# Patient Record
Sex: Male | Born: 1960 | Race: White | Hispanic: No | Smoking: Former smoker
Health system: Southern US, Community
[De-identification: ages and names within clinical notes are randomized; demographics above are authoritative.]

## PROBLEM LIST (undated history)

## (undated) ENCOUNTER — Ambulatory Visit: Admission: EM | Payer: Commercial Managed Care - PPO | Source: Home / Self Care

## (undated) DIAGNOSIS — E7800 Pure hypercholesterolemia, unspecified: Secondary | ICD-10-CM

## (undated) DIAGNOSIS — I10 Essential (primary) hypertension: Secondary | ICD-10-CM

## (undated) DIAGNOSIS — I2510 Atherosclerotic heart disease of native coronary artery without angina pectoris: Secondary | ICD-10-CM

## (undated) HISTORY — PX: ANKLE SURGERY: SHX546

## (undated) HISTORY — PX: CORONARY ARTERY BYPASS GRAFT: SHX141

---

## 2012-10-30 ENCOUNTER — Emergency Department (HOSPITAL_COMMUNITY): Payer: Self-pay

## 2012-10-30 ENCOUNTER — Emergency Department (HOSPITAL_COMMUNITY)
Admission: EM | Admit: 2012-10-30 | Discharge: 2012-10-30 | Disposition: A | Discharge status: Home / Self Care | Payer: Self-pay | Attending: Emergency Medicine | Admitting: Emergency Medicine

## 2012-10-30 ENCOUNTER — Encounter (HOSPITAL_COMMUNITY): Payer: Self-pay | Admitting: *Deleted

## 2012-10-30 DIAGNOSIS — S0990XA Unspecified injury of head, initial encounter: Secondary | ICD-10-CM | POA: Insufficient documentation

## 2012-10-30 DIAGNOSIS — Z23 Encounter for immunization: Secondary | ICD-10-CM | POA: Insufficient documentation

## 2012-10-30 DIAGNOSIS — W1809XA Striking against other object with subsequent fall, initial encounter: Secondary | ICD-10-CM | POA: Insufficient documentation

## 2012-10-30 DIAGNOSIS — S0101XA Laceration without foreign body of scalp, initial encounter: Secondary | ICD-10-CM

## 2012-10-30 DIAGNOSIS — S139XXA Sprain of joints and ligaments of unspecified parts of neck, initial encounter: Secondary | ICD-10-CM | POA: Insufficient documentation

## 2012-10-30 DIAGNOSIS — I10 Essential (primary) hypertension: Secondary | ICD-10-CM | POA: Insufficient documentation

## 2012-10-30 DIAGNOSIS — Y9289 Other specified places as the place of occurrence of the external cause: Secondary | ICD-10-CM | POA: Insufficient documentation

## 2012-10-30 DIAGNOSIS — F17200 Nicotine dependence, unspecified, uncomplicated: Secondary | ICD-10-CM | POA: Insufficient documentation

## 2012-10-30 DIAGNOSIS — S161XXA Strain of muscle, fascia and tendon at neck level, initial encounter: Secondary | ICD-10-CM

## 2012-10-30 DIAGNOSIS — W19XXXA Unspecified fall, initial encounter: Secondary | ICD-10-CM

## 2012-10-30 DIAGNOSIS — S0100XA Unspecified open wound of scalp, initial encounter: Secondary | ICD-10-CM | POA: Insufficient documentation

## 2012-10-30 DIAGNOSIS — Y9389 Activity, other specified: Secondary | ICD-10-CM | POA: Insufficient documentation

## 2012-10-30 DIAGNOSIS — W010XXA Fall on same level from slipping, tripping and stumbling without subsequent striking against object, initial encounter: Secondary | ICD-10-CM | POA: Insufficient documentation

## 2012-10-30 HISTORY — DX: Essential (primary) hypertension: I10

## 2012-10-30 MED ORDER — TETANUS-DIPHTH-ACELL PERTUSSIS 5-2.5-18.5 LF-MCG/0.5 IM SUSP
0.5000 mL | Freq: Once | INTRAMUSCULAR | Status: AC
  Administered 2012-10-30: 0.5 mL via INTRAMUSCULAR
  Filled 2012-10-30 (×2): qty 0.5

## 2012-10-30 MED ORDER — LIDOCAINE HCL (PF) 1 % IJ SOLN
INTRAMUSCULAR | Status: AC
  Administered 2012-10-30: 5 mL
  Filled 2012-10-30: qty 5

## 2012-10-30 NOTE — ED Notes (Signed)
c-collar applied, cms intact all extremities.

## 2012-10-30 NOTE — ED Notes (Signed)
Pt brought to er by family after fall and  laceration to head that occurred this am, pt states that he thinks he may have tripped in his shop this am causing him to fall hitting his head on the table in the shop, according to family they found pt right after the fall, denies any LOC, pt is confused and has been confused since the fall. Unable to state his birth date. C/o laceration to head, neck pain. Smells of etoh, admits to drinking 8 beers last night.

## 2012-10-30 NOTE — ED Provider Notes (Signed)
History  This chart was scribed for Dennis Lyons, MD by Shari Heritage, ED Scribe. The patient was seen in room APA04/APA04. Patient's care was started at 1054.   CSN: [id number]  Arrival date & time 10/30/12  1034   First MD Initiated Contact with Patient 10/30/12 1054      Chief Complaint  Patient presents with  . Head Injury     The history is provided by the patient. No language interpreter was used.   HPI Comments: Dennis Winters is a 52 y.o. male who presents to the Emergency Department complaining of laceration to the right scalp onset 3 hours ago. Patient is also complaining of moderate, constant head pain. Patient states that he tripped and fell in his shop at about 8am this morning. He reports that laceration results from him hitting his head on a table upon falling. There are no other injuries or skin tears. He denies nausea, vomiting, neck pain, chest pain, abdominal pain, leg pain, numbness, weakness or any other symptoms. Perd ED staff, patient admits to having consumed at least 8 beers since last night. Tetanus status is unknown. Patient has a history of hypertension.   Past Medical History  Diagnosis Date  . Hypertension     Past Surgical History  Procedure Laterality Date  . Ankle surgery      No family history on file.  History  Substance Use Topics  . Smoking status: Current Every Day Smoker  . Smokeless tobacco: Not on file  . Alcohol Use: Yes      Review of Systems A complete 10 system review of systems was obtained and all systems are negative except as noted in the HPI and PMH.   Allergies  Penicillins  Home Medications  No current outpatient prescriptions on file.  Triage Vitals: BP 151/101  Pulse 72  Temp(Src) 97 F (36.1 C) (Oral)  Resp 20  SpO2 97%  Physical Exam  Constitutional: He is oriented to person, place, and time. He appears well-developed and well-nourished.  HENT:  Head: Normocephalic. Head is with laceration.  There is  a 5 cm L shaped laceration to the right scalp just above the ear. Bleeding is controlled.  Eyes: EOM are normal. Pupils are equal, round, and reactive to light.  Neck: Normal range of motion. Neck supple.  Pulmonary/Chest: No respiratory distress.  Musculoskeletal: Normal range of motion.  Neurological: He is alert and oriented to person, place, and time. No cranial nerve deficit. He exhibits normal muscle tone. Coordination normal.  Skin: Skin is warm and dry.    ED Course  Procedures (including critical care time) DIAGNOSTIC STUDIES: Oxygen Saturation is 97% on room air, adequate by my interpretation.    COORDINATION OF CARE: 10:59 AM-Patient presents with laceration to right scalp after falling in his shop. Admits to drinking several beers since last night. Will order CT of head and cervical spine and apply staples to laceration site.  Patient informed of current plan for treatment and evaluation and agrees with plan at this time.   11:04 AM- Placed staples to laceration site. Patient tolerated the procedure well without complications. LACERATION REPAIR PROCEDURE NOTE The patient's identification was confirmed and consent was obtained. This procedure was performed by Dennis Lyons, MD at 11:04 AM. Site: right scalp Sterile procedures observed Anesthetic used (type and amt): lidocaine 1% wo epi, 5 cc Length: 5 cm # of Staples: 8 Technique: Staples Complexity: uncomplicated Antibx ointment applied Tetanus ordered Site anesthetized, irrigated with NS, explored without evidence  of foreign body, wound well approximated, site covered with dry, sterile dressing.  Patient tolerated procedure well without complications. Instructions for care discussed verbally and patient provided with additional written instructions for homecare and f/u.   No results found.   No diagnosis found.    MDM  The patient presents after a fall at home in his work shop.  He hit his head on the corner  of the table and it appears as though he had a brief loc.  He does appear somewhat intoxicated with a strong odor of etoh, but is alert and appropriate.   The laceration was repaired with staples and the ct of the head and cervical spine are unremarkable.  He will be discharged with local wound care, staples out in 7 days.  Return prn.      I personally performed the services described in this documentation, which was scribed in my presence. The recorded information has been reviewed and is accurate.     Dennis Lyons, MD 10/30/12 1235

## 2013-04-12 ENCOUNTER — Observation Stay (HOSPITAL_COMMUNITY)
Admission: EM | Admit: 2013-04-12 | Discharge: 2013-04-14 | Disposition: A | Discharge status: Home / Self Care | Payer: Self-pay | Attending: General Surgery | Admitting: General Surgery

## 2013-04-12 ENCOUNTER — Encounter (HOSPITAL_COMMUNITY): Payer: Self-pay | Admitting: Emergency Medicine

## 2013-04-12 ENCOUNTER — Emergency Department (HOSPITAL_COMMUNITY): Payer: Self-pay

## 2013-04-12 DIAGNOSIS — I10 Essential (primary) hypertension: Secondary | ICD-10-CM | POA: Insufficient documentation

## 2013-04-12 DIAGNOSIS — K8020 Calculus of gallbladder without cholecystitis without obstruction: Secondary | ICD-10-CM

## 2013-04-12 DIAGNOSIS — K819 Cholecystitis, unspecified: Secondary | ICD-10-CM

## 2013-04-12 DIAGNOSIS — K812 Acute cholecystitis with chronic cholecystitis: Principal | ICD-10-CM | POA: Insufficient documentation

## 2013-04-12 LAB — CBC WITH DIFFERENTIAL/PLATELET
Basophils Absolute: 0 10*3/uL (ref 0.0–0.1)
HCT: 50 % (ref 39.0–52.0)
Hemoglobin: 17.8 g/dL — ABNORMAL HIGH (ref 13.0–17.0)
Lymphocytes Relative: 11 % — ABNORMAL LOW (ref 12–46)
Lymphs Abs: 1.6 10*3/uL (ref 0.7–4.0)
MCV: 93.3 fL (ref 78.0–100.0)
Monocytes Absolute: 0.9 10*3/uL (ref 0.1–1.0)
Neutro Abs: 10.9 10*3/uL — ABNORMAL HIGH (ref 1.7–7.7)
RBC: 5.36 MIL/uL (ref 4.22–5.81)
RDW: 12.4 % (ref 11.5–15.5)
WBC: 13.7 10*3/uL — ABNORMAL HIGH (ref 4.0–10.5)

## 2013-04-12 LAB — COMPREHENSIVE METABOLIC PANEL
ALT: 14 U/L (ref 0–53)
AST: 14 U/L (ref 0–37)
CO2: 27 mEq/L (ref 19–32)
Chloride: 94 mEq/L — ABNORMAL LOW (ref 96–112)
Creatinine, Ser: 0.98 mg/dL (ref 0.50–1.35)
GFR calc Af Amer: >90 mL/min (ref >90)
GFR calc non Af Amer: >90 mL/min (ref >90)
Glucose, Bld: 114 mg/dL — ABNORMAL HIGH (ref 70–99)
Total Bilirubin: 0.8 mg/dL (ref 0.3–1.2)

## 2013-04-12 MED ORDER — ONDANSETRON HCL 4 MG/2ML IJ SOLN
4.0000 mg | Freq: Once | INTRAMUSCULAR | Status: AC
  Administered 2013-04-12: 4 mg via INTRAVENOUS
  Filled 2013-04-12: qty 2

## 2013-04-12 MED ORDER — HYDROMORPHONE HCL PF 1 MG/ML IJ SOLN
1.0000 mg | Freq: Once | INTRAMUSCULAR | Status: AC
  Administered 2013-04-12: 1 mg via INTRAVENOUS
  Filled 2013-04-12: qty 1

## 2013-04-12 MED ORDER — DOCUSATE SODIUM 100 MG PO CAPS
100.0000 mg | ORAL_CAPSULE | Freq: Two times a day (BID) | ORAL | Status: DC
  Administered 2013-04-13 – 2013-04-14 (×2): 100 mg via ORAL
  Filled 2013-04-12 (×4): qty 1

## 2013-04-12 MED ORDER — ONDANSETRON HCL 4 MG/2ML IJ SOLN: 4.0000 mg | Freq: Four times a day (QID) | INTRAMUSCULAR | Status: DC | PRN

## 2013-04-12 MED ORDER — SODIUM CHLORIDE 0.9 % IV BOLUS (SEPSIS)
1000.0000 mL | Freq: Once | INTRAVENOUS | Status: AC
  Administered 2013-04-12: 1000 mL via INTRAVENOUS

## 2013-04-12 MED ORDER — DIPHENHYDRAMINE HCL 12.5 MG/5ML PO ELIX
12.5000 mg | ORAL_SOLUTION | Freq: Four times a day (QID) | ORAL | Status: DC | PRN
  Administered 2013-04-13 – 2013-04-14 (×3): 12.5 mg via ORAL
  Filled 2013-04-12: qty 5
  Filled 2013-04-12 (×2): qty 10

## 2013-04-12 MED ORDER — ENOXAPARIN SODIUM 40 MG/0.4ML ~~LOC~~ SOLN
40.0000 mg | SUBCUTANEOUS | Status: DC
  Filled 2013-04-12: qty 0.4

## 2013-04-12 MED ORDER — MORPHINE SULFATE 2 MG/ML IJ SOLN
2.0000 mg | INTRAMUSCULAR | Status: DC | PRN
  Administered 2013-04-13: 2 mg via INTRAVENOUS
  Filled 2013-04-12: qty 1

## 2013-04-12 MED ORDER — DIPHENHYDRAMINE HCL 50 MG/ML IJ SOLN: 12.5000 mg | Freq: Four times a day (QID) | INTRAMUSCULAR | Status: DC | PRN

## 2013-04-12 MED ORDER — LACTATED RINGERS IV SOLN
INTRAVENOUS | Status: DC
  Administered 2013-04-13: 1000 mL via INTRAVENOUS
  Administered 2013-04-13: via INTRAVENOUS

## 2013-04-12 MED ORDER — CIPROFLOXACIN IN D5W 400 MG/200ML IV SOLN
400.0000 mg | Freq: Two times a day (BID) | INTRAVENOUS | Status: DC
  Administered 2013-04-13: 400 mg via INTRAVENOUS
  Filled 2013-04-12 (×3): qty 200

## 2013-04-12 NOTE — ED Provider Notes (Signed)
CSN: [id number]     Arrival date & time 04/12/13  1943 History   First MD Initiated Contact with Patient 04/12/13 1956     Chief Complaint  Patient presents with  . Abdominal Pain   (Consider location/radiation/quality/duration/timing/severity/associated sxs/prior Treatment) HPI Comments: Patient is a 52 year old male past medical history of hypertension who presents to the emergency department complaining of gradual onset midepigastric abdominal pain x3 days. Patient states over the weekend 4 days ago he was at a wedding and drank a lot of beer, had a cheeseburger and the next morning on his way to work developed nonradiating midepigastric abdominal pain described as "intense", gradually increasing into today. Admits to associated nausea, vomiting and diarrhea 2 days ago, but those symptoms subsided yesterday, today the nausea returned. Admits to decreased appetite. Denies fever, chills, chest pain, shortness of breath. No history of abdominal surgeries. Denies increased urinary frequency, urgency or dysuria.  The history is provided by the patient and a significant other.    Past Medical History  Diagnosis Date  . Hypertension    Past Surgical History  Procedure Laterality Date  . Ankle surgery     History reviewed. No pertinent family history. History  Substance Use Topics  . Smoking status: Current Every Day Smoker  . Smokeless tobacco: Not on file  . Alcohol Use: 3.6 oz/week    6 Cans of beer per week    Review of Systems  Constitutional: Positive for appetite change. Negative for fever and chills.  Respiratory: Negative for shortness of breath.   Cardiovascular: Negative for chest pain.  Gastrointestinal: Positive for nausea, vomiting, abdominal pain and diarrhea.  Genitourinary: Negative.   Musculoskeletal: Negative for back pain.  All other systems reviewed and are negative.    Allergies  Penicillins  Home Medications  No current outpatient prescriptions on  file. BP 161/103  Pulse 85  Resp 20  SpO2 96% Physical Exam  Nursing note and vitals reviewed. Constitutional: He is oriented to person, place, and time. He appears well-developed and well-nourished. No distress.  Appears uncomfortable.  HENT:  Head: Normocephalic and atraumatic.  Mouth/Throat: Oropharynx is clear and moist.  Eyes: Conjunctivae are normal.  Neck: Normal range of motion. Neck supple.  Cardiovascular: Normal rate, regular rhythm, normal heart sounds and intact distal pulses.   Pulmonary/Chest: Breath sounds normal. Tachypnea noted. No respiratory distress. He has no decreased breath sounds. He has no wheezes. He has no rhonchi. He has no rales.  Abdominal: Soft. Normal appearance and bowel sounds are normal. He exhibits no distension and no mass. There is tenderness in the epigastric area and periumbilical area. There is guarding. There is no rigidity, no rebound and no CVA tenderness.  No peritoneal signs.  Musculoskeletal: Normal range of motion. He exhibits no edema.  Neurological: He is alert and oriented to person, place, and time.  Skin: Skin is warm and dry. He is not diaphoretic.  Psychiatric: He has a normal mood and affect. His behavior is normal.    ED Course  Procedures (including critical care time) Labs Review Labs Reviewed  CBC WITH DIFFERENTIAL - Abnormal; Notable for the following:    WBC 13.7 (*)    Hemoglobin 17.8 (*)    Neutrophils Relative % 80 (*)    Neutro Abs 10.9 (*)    Lymphocytes Relative 11 (*)    All other components within normal limits  COMPREHENSIVE METABOLIC PANEL - Abnormal; Notable for the following:    Sodium 131 (*)  Chloride 94 (*)    Glucose, Bld 114 (*)    All other components within normal limits  LIPASE, BLOOD  CBC   Imaging Review US Abdomen Complete  04/12/2013   CLINICAL DATA:  Abdominal pain  EXAM: ULTRASOUND ABDOMEN COMPLETE  COMPARISON:  None.  FINDINGS: Gallbladder  Nonmobile echogenic foci on on the  nondependent wall of gallbladder may represent adherent gallstones or polyps. Favor gallstones. Gallbladder wall is thickened measuring 6.8 mm. Positive sonographic Murphy sign. Findings suggest acute cholecystitis.  Common bile duct  Diameter: 4.3 mm  Liver  Echogenic liver without focal lesion.  IVC  Nonvisualized  Pancreas  Nonvisualized  Spleen  Size and appearance within normal limits.  Right Kidney  Length: 13.5 cm. Echogenicity within normal limits. No mass or hydronephrosis visualized.  Left Kidney  Length: 12.4 cm. Echogenicity within normal limits. No mass or hydronephrosis visualized.  Abdominal aorta  Nonvisualized  IMPRESSION: Gallstones versus gallbladder polyps. Gallbladder wall thickening and pain over the gallbladder suggestive of acute cholecystitis. Common bile duct nondilated.  Limited study due to bowel gas and body habitus.   Electronically Signed   By: Marlan Palau M.D.   On: 04/12/2013 22:29    EKG Interpretation     Ventricular Rate:  93 PR Interval:  149 QRS Duration: 86 QT Interval:  355 QTC Calculation: 442 R Axis:   -4 Text Interpretation:  Sinus rhythm Probable left atrial enlargement Abnormal R-wave progression, early transition Borderline T wave abnormalities            MDM   1. Cholecystitis   2. Gallstones     Patient with mid-epigastric abdominal pain, n/v. He appears uncomfortable but in NAD. Possible pancreatitis as symptoms began after drinking heavily at a wedding. Labs pending- cbc, cmp, lipase. IV fluids, dilaudid, zofran. 9:48 PM Patient reports pain has improved with above regimen. Leukocytosis of 13.7, labs otherwise within normal limits. Abdomen still tender in mid-epigastric region, probable gastritis, will obtain abdominal US to evaluate gallbladder. 11:01 PM Abdominal ultrasound with results as shown above. Patient will be admitted for cholecystitis and gallstones. Admission excepted by Dr. Maisie Fus with [facility]. Patient  NPO, has not had anything to eat since 9:00 this morning. Case discussed with attending Dr. Rhunette Croft who agrees with plan of care.  Trevor Mace, PA-C 04/12/13 2302

## 2013-04-12 NOTE — ED Notes (Signed)
Pt states that he started having mid upper abdominal pain, N/V/D on Sunday, got better yesterday, and is hurting worse today.

## 2013-04-12 NOTE — H&P (Signed)
Dennis Winters is an 52 y.o. male.   Chief Complaint: nausea, vomiting, abd pain HPI: patient presents to the ED with approximately 3 days of right upper quadrant pain. He states that this began on Sunday after attending a wedding.  On Monday he had what a bit of nausea and vomiting. This resolved by Tuesday but he did have some diarrhea at this time. Since then he has had mainly persistent mid epigastric and right upper quadrant tenderness. This is worse with movement. It does not appear to get worse after meals.  He has never had this pain before.  Past Medical History  Diagnosis Date  . Hypertension     Past Surgical History  Procedure Laterality Date  . Ankle surgery      History reviewed. No pertinent family history. Social History:  reports that he has been smoking.  He does not have any smokeless tobacco history on file. He reports that he drinks about 3.6 ounces of alcohol per week. He reports that he does not use illicit drugs.  Allergies:  Allergies  Allergen Reactions  . Penicillins     Rash      (Not in a hospital admission)  Results for orders placed during the hospital encounter of 04/12/13 (from the past 48 hour(s))  CBC WITH DIFFERENTIAL     Status: Abnormal   Collection Time    04/12/13  8:23 PM      Result Value Range   WBC 13.7 (*) 4.0 - 10.5 K/uL   RBC 5.36  4.22 - 5.81 MIL/uL   Hemoglobin 17.8 (*) 13.0 - 17.0 g/dL   HCT 40.9  81.1 - 91.4 %   MCV 93.3  78.0 - 100.0 fL   MCH 33.2  26.0 - 34.0 pg   MCHC 35.6  30.0 - 36.0 g/dL   RDW 78.2  95.6 - 21.3 %   Platelets 251  150 - 400 K/uL   Neutrophils Relative % 80 (*) 43 - 77 %   Neutro Abs 10.9 (*) 1.7 - 7.7 K/uL   Lymphocytes Relative 11 (*) 12 - 46 %   Lymphs Abs 1.6  0.7 - 4.0 K/uL   Monocytes Relative 7  3 - 12 %   Monocytes Absolute 0.9  0.1 - 1.0 K/uL   Eosinophils Relative 2  0 - 5 %   Eosinophils Absolute 0.3  0.0 - 0.7 K/uL   Basophils Relative 0  0 - 1 %   Basophils Absolute 0.0  0.0 - 0.1  K/uL  COMPREHENSIVE METABOLIC PANEL     Status: Abnormal   Collection Time    04/12/13  8:23 PM      Result Value Range   Sodium 131 (*) 135 - 145 mEq/L   Potassium 3.6  3.5 - 5.1 mEq/L   Chloride 94 (*) 96 - 112 mEq/L   CO2 27  19 - 32 mEq/L   Glucose, Bld 114 (*) 70 - 99 mg/dL   BUN 7  6 - 23 mg/dL   Creatinine, Ser 0.86  0.50 - 1.35 mg/dL   Calcium 9.5  8.4 - 57.8 mg/dL   Total Protein 7.6  6.0 - 8.3 g/dL   Albumin 3.8  3.5 - 5.2 g/dL   AST 14  0 - 37 U/L   ALT 14  0 - 53 U/L   Alkaline Phosphatase 83  39 - 117 U/L   Total Bilirubin 0.8  0.3 - 1.2 mg/dL   GFR calc non Af Amer >90  >  90 mL/min   GFR calc Af Amer >90  >90 mL/min   Comment: (NOTE)     The eGFR has been calculated using the CKD EPI equation.     This calculation has not been validated in all clinical situations.     eGFR's persistently <90 mL/min signify possible Chronic Kidney     Disease.  LIPASE, BLOOD     Status: None   Collection Time    04/12/13  8:23 PM      Result Value Range   Lipase 20  11 - 59 U/L   US Abdomen Complete  04/12/2013   CLINICAL DATA:  Abdominal pain  EXAM: ULTRASOUND ABDOMEN COMPLETE  COMPARISON:  None.  FINDINGS: Gallbladder  Nonmobile echogenic foci on on the nondependent wall of gallbladder may represent adherent gallstones or polyps. Favor gallstones. Gallbladder wall is thickened measuring 6.8 mm. Positive sonographic Murphy sign. Findings suggest acute cholecystitis.  Common bile duct  Diameter: 4.3 mm  Liver  Echogenic liver without focal lesion.  IVC  Nonvisualized  Pancreas  Nonvisualized  Spleen  Size and appearance within normal limits.  Right Kidney  Length: 13.5 cm. Echogenicity within normal limits. No mass or hydronephrosis visualized.  Left Kidney  Length: 12.4 cm. Echogenicity within normal limits. No mass or hydronephrosis visualized.  Abdominal aorta  Nonvisualized  IMPRESSION: Gallstones versus gallbladder polyps. Gallbladder wall thickening and pain over the gallbladder  suggestive of acute cholecystitis. Common bile duct nondilated.  Limited study due to bowel gas and body habitus.   Electronically Signed   By: Marlan Palau M.D.   On: 04/12/2013 22:29    Review of Systems  Constitutional: Positive for fever and chills.  Respiratory: Negative for cough, shortness of breath and wheezing.   Cardiovascular: Negative for chest pain.  Gastrointestinal: Positive for nausea, vomiting, abdominal pain and diarrhea. Negative for heartburn, constipation, blood in stool and melena.  Genitourinary: Negative for dysuria, urgency and frequency.  Skin: Negative for rash.  Neurological: Negative for dizziness and headaches.    Blood pressure 133/86, pulse 73, temperature 98.9 F (37.2 C), temperature source Oral, resp. rate 19, SpO2 94.00%. Physical Exam  Constitutional: He is oriented to person, place, and time. He appears well-developed and well-nourished. No distress.  HENT:  Head: Normocephalic and atraumatic.  Eyes: Conjunctivae are normal. Pupils are equal, round, and reactive to light.  Neck: Normal range of motion.  Cardiovascular: Normal rate, regular rhythm and normal heart sounds.   Respiratory: Effort normal and breath sounds normal. No respiratory distress. He has no wheezes.  GI: Soft. Bowel sounds are normal. He exhibits no distension. There is tenderness.  RUQ  Neurological: He is alert and oriented to person, place, and time.  Skin: Skin is warm and dry. He is not diaphoretic.     Assessment/Plan [name] is a 52 year old male who presents with approximately 3 days of nausea vomiting and right upper quadrant pain. A right upper quadrant ultrasound shows gallbladder wall thickening, and normal common bile ducts and gallstone versus gallbladder polyps. Given his clinical picture, it most likely has acute cholecystitis. I will admit him to the floor. I will place him on Cipro IV. He will be n.p.o. with IV fluids. I anticipate he will need  cholecystectomy tomorrow.  Khamil Lamica C. 04/12/2013, 11:09 PM

## 2013-04-13 ENCOUNTER — Inpatient Hospital Stay (HOSPITAL_COMMUNITY): Payer: Self-pay | Admitting: Anesthesiology

## 2013-04-13 ENCOUNTER — Encounter (HOSPITAL_COMMUNITY): Payer: Self-pay | Admitting: Anesthesiology

## 2013-04-13 ENCOUNTER — Inpatient Hospital Stay (HOSPITAL_COMMUNITY): Payer: Self-pay

## 2013-04-13 ENCOUNTER — Encounter (HOSPITAL_COMMUNITY)
Admission: EM | Disposition: A | Discharge status: Home / Self Care | Payer: Self-pay | Source: Home / Self Care | Attending: Emergency Medicine

## 2013-04-13 ENCOUNTER — Encounter (HOSPITAL_COMMUNITY): Payer: Self-pay | Admitting: *Deleted

## 2013-04-13 HISTORY — PX: CHOLECYSTECTOMY: SHX55

## 2013-04-13 LAB — CBC
HCT: 48.9 % (ref 39.0–52.0)
Hemoglobin: 16.7 g/dL (ref 13.0–17.0)
MCH: 32.4 pg (ref 26.0–34.0)
Platelets: 266 10*3/uL (ref 150–400)
RBC: 5.15 MIL/uL (ref 4.22–5.81)
RDW: 12.4 % (ref 11.5–15.5)
WBC: 12.4 10*3/uL — ABNORMAL HIGH (ref 4.0–10.5)

## 2013-04-13 LAB — CREATININE, SERUM
Creatinine, Ser: 1.01 mg/dL (ref 0.50–1.35)
GFR calc Af Amer: >90 mL/min (ref >90)
GFR calc non Af Amer: 84 mL/min — ABNORMAL LOW (ref >90)

## 2013-04-13 LAB — SURGICAL PCR SCREEN
MRSA, PCR: NEGATIVE
Staphylococcus aureus: NEGATIVE

## 2013-04-13 SURGERY — LAPAROSCOPIC CHOLECYSTECTOMY WITH INTRAOPERATIVE CHOLANGIOGRAM
Anesthesia: General | Site: Abdomen | Wound class: Contaminated

## 2013-04-13 MED ORDER — ONDANSETRON HCL 4 MG/2ML IJ SOLN: 4.0000 mg | Freq: Four times a day (QID) | INTRAMUSCULAR | Status: DC | PRN

## 2013-04-13 MED ORDER — KETAMINE HCL 10 MG/ML IJ SOLN
INTRAMUSCULAR | Status: DC | PRN
  Administered 2013-04-13: 20 mg via INTRAVENOUS

## 2013-04-13 MED ORDER — OXYCODONE-ACETAMINOPHEN 5-325 MG PO TABS: 1.0000 | ORAL_TABLET | ORAL | Status: DC | PRN

## 2013-04-13 MED ORDER — KCL IN DEXTROSE-NACL 20-5-0.9 MEQ/L-%-% IV SOLN
INTRAVENOUS | Status: DC
  Administered 2013-04-13 – 2013-04-14 (×2): via INTRAVENOUS
  Filled 2013-04-13 (×4): qty 1000

## 2013-04-13 MED ORDER — SUCCINYLCHOLINE CHLORIDE 20 MG/ML IJ SOLN
INTRAMUSCULAR | Status: DC | PRN
  Administered 2013-04-13: 100 mg via INTRAVENOUS

## 2013-04-13 MED ORDER — PROPOFOL 10 MG/ML IV BOLUS
INTRAVENOUS | Status: DC | PRN
  Administered 2013-04-13: 200 mg via INTRAVENOUS

## 2013-04-13 MED ORDER — ENOXAPARIN SODIUM 40 MG/0.4ML ~~LOC~~ SOLN
40.0000 mg | SUBCUTANEOUS | Status: DC
  Administered 2013-04-14: 40 mg via SUBCUTANEOUS
  Filled 2013-04-13 (×2): qty 0.4

## 2013-04-13 MED ORDER — BOOST / RESOURCE BREEZE PO LIQD
1.0000 | Freq: Two times a day (BID) | ORAL | Status: DC
  Administered 2013-04-14: 1 via ORAL

## 2013-04-13 MED ORDER — MIDAZOLAM HCL 5 MG/5ML IJ SOLN
INTRAMUSCULAR | Status: DC | PRN
  Administered 2013-04-13: 2 mg via INTRAVENOUS

## 2013-04-13 MED ORDER — ROCURONIUM BROMIDE 100 MG/10ML IV SOLN
INTRAVENOUS | Status: DC | PRN
  Administered 2013-04-13: 15 mg via INTRAVENOUS
  Administered 2013-04-13: 35 mg via INTRAVENOUS

## 2013-04-13 MED ORDER — CIPROFLOXACIN IN D5W 400 MG/200ML IV SOLN
400.0000 mg | Freq: Two times a day (BID) | INTRAVENOUS | Status: DC
  Administered 2013-04-13 – 2013-04-14 (×2): 400 mg via INTRAVENOUS
  Filled 2013-04-13 (×2): qty 200

## 2013-04-13 MED ORDER — DEXAMETHASONE SODIUM PHOSPHATE 10 MG/ML IJ SOLN
INTRAMUSCULAR | Status: DC | PRN
  Administered 2013-04-13: 10 mg via INTRAVENOUS

## 2013-04-13 MED ORDER — CIPROFLOXACIN IN D5W 400 MG/200ML IV SOLN
400.0000 mg | INTRAVENOUS | Status: AC
  Administered 2013-04-13: 400 mg via INTRAVENOUS

## 2013-04-13 MED ORDER — BUPIVACAINE-EPINEPHRINE PF 0.5-1:200000 % IJ SOLN
INTRAMUSCULAR | Status: AC
  Filled 2013-04-13: qty 30

## 2013-04-13 MED ORDER — HYDROMORPHONE HCL PF 1 MG/ML IJ SOLN
INTRAMUSCULAR | Status: AC
  Filled 2013-04-13: qty 1

## 2013-04-13 MED ORDER — ONDANSETRON HCL 4 MG/2ML IJ SOLN
INTRAMUSCULAR | Status: DC | PRN
  Administered 2013-04-13: 4 mg via INTRAVENOUS

## 2013-04-13 MED ORDER — ESMOLOL HCL 10 MG/ML IV SOLN
INTRAVENOUS | Status: DC | PRN
  Administered 2013-04-13: 10 mg via INTRAVENOUS
  Administered 2013-04-13 (×2): 20 mg via INTRAVENOUS

## 2013-04-13 MED ORDER — FAMOTIDINE 20 MG PO TABS
20.0000 mg | ORAL_TABLET | Freq: Every day | ORAL | Status: DC
  Filled 2013-04-13: qty 1

## 2013-04-13 MED ORDER — ONDANSETRON HCL 4 MG PO TABS: 4.0000 mg | ORAL_TABLET | Freq: Four times a day (QID) | ORAL | Status: DC | PRN

## 2013-04-13 MED ORDER — HYDROMORPHONE HCL PF 1 MG/ML IJ SOLN
0.5000 mg | INTRAMUSCULAR | Status: DC | PRN
  Administered 2013-04-13 – 2013-04-14 (×4): 1 mg via INTRAVENOUS
  Filled 2013-04-13 (×4): qty 1

## 2013-04-13 MED ORDER — FENTANYL CITRATE 0.05 MG/ML IJ SOLN
INTRAMUSCULAR | Status: DC | PRN
  Administered 2013-04-13 (×5): 50 ug via INTRAVENOUS

## 2013-04-13 MED ORDER — PNEUMOCOCCAL VAC POLYVALENT 25 MCG/0.5ML IJ INJ
0.5000 mL | INJECTION | INTRAMUSCULAR | Status: DC | PRN
  Filled 2013-04-13: qty 0.5

## 2013-04-13 MED ORDER — HYDROMORPHONE HCL PF 1 MG/ML IJ SOLN
1.0000 mg | Freq: Once | INTRAMUSCULAR | Status: AC
  Administered 2013-04-13: 1 mg via INTRAVENOUS

## 2013-04-13 MED ORDER — CHLORHEXIDINE GLUCONATE 4 % EX LIQD
1.0000 "application " | Freq: Once | CUTANEOUS | Status: DC
  Filled 2013-04-13: qty 15

## 2013-04-13 MED ORDER — 0.9 % SODIUM CHLORIDE (POUR BTL) OPTIME
TOPICAL | Status: DC | PRN
  Administered 2013-04-13: 1000 mL

## 2013-04-13 MED ORDER — FAMOTIDINE 20 MG PO TABS
20.0000 mg | ORAL_TABLET | Freq: Every day | ORAL | Status: DC
  Administered 2013-04-13 – 2013-04-14 (×2): 20 mg via ORAL
  Filled 2013-04-13 (×2): qty 1

## 2013-04-13 MED ORDER — LACTATED RINGERS IR SOLN
Status: DC | PRN
  Administered 2013-04-13: 1000 mL

## 2013-04-13 MED ORDER — LACTATED RINGERS IV SOLN: INTRAVENOUS | Status: DC

## 2013-04-13 MED ORDER — PROMETHAZINE HCL 25 MG/ML IJ SOLN: 6.2500 mg | INTRAMUSCULAR | Status: DC | PRN

## 2013-04-13 MED ORDER — BUPIVACAINE-EPINEPHRINE PF 0.25-1:200000 % IJ SOLN
INTRAMUSCULAR | Status: DC | PRN
  Administered 2013-04-13: 10 mL

## 2013-04-13 MED ORDER — HYDROMORPHONE HCL PF 1 MG/ML IJ SOLN
0.2500 mg | INTRAMUSCULAR | Status: DC | PRN
  Administered 2013-04-13 (×2): 0.5 mg via INTRAVENOUS

## 2013-04-13 MED ORDER — LIDOCAINE HCL (CARDIAC) 20 MG/ML IV SOLN
INTRAVENOUS | Status: DC | PRN
  Administered 2013-04-13: 100 mg via INTRAVENOUS

## 2013-04-13 MED ORDER — GLYCOPYRROLATE 0.2 MG/ML IJ SOLN
INTRAMUSCULAR | Status: DC | PRN
  Administered 2013-04-13: 0.6 mg via INTRAVENOUS

## 2013-04-13 MED ORDER — LACTATED RINGERS IV SOLN
INTRAVENOUS | Status: DC | PRN
  Administered 2013-04-13: 09:00:00 via INTRAVENOUS

## 2013-04-13 MED ORDER — INFLUENZA VAC SPLIT QUAD 0.5 ML IM SUSP
0.5000 mL | INTRAMUSCULAR | Status: DC | PRN
  Filled 2013-04-13: qty 0.5

## 2013-04-13 MED ORDER — NEOSTIGMINE METHYLSULFATE 1 MG/ML IJ SOLN
INTRAMUSCULAR | Status: DC | PRN
  Administered 2013-04-13: 5 mg via INTRAVENOUS

## 2013-04-13 MED ORDER — BUPIVACAINE-EPINEPHRINE PF 0.25-1:200000 % IJ SOLN
INTRAMUSCULAR | Status: AC
  Filled 2013-04-13: qty 30

## 2013-04-13 MED ORDER — HYDROMORPHONE HCL PF 1 MG/ML IJ SOLN
1.0000 mg | Freq: Once | INTRAMUSCULAR | Status: DC
  Filled 2013-04-13: qty 1

## 2013-04-13 SURGICAL SUPPLY — 44 items
APPLIER CLIP ROT 10 11.4 M/L (STAPLE) ×2
BENZOIN TINCTURE PRP APPL 2/3 (GAUZE/BANDAGES/DRESSINGS) IMPLANT
CANISTER SUCTION 2500CC (MISCELLANEOUS) ×2 IMPLANT
CLIP APPLIE ROT 10 11.4 M/L (STAPLE) ×1 IMPLANT
CLOTH BEACON ORANGE TIMEOUT ST (SAFETY) IMPLANT
COVER MAYO STAND STRL (DRAPES) ×2 IMPLANT
DECANTER SPIKE VIAL GLASS SM (MISCELLANEOUS) IMPLANT
DERMABOND ADVANCED (GAUZE/BANDAGES/DRESSINGS) ×1
DERMABOND ADVANCED .7 DNX12 (GAUZE/BANDAGES/DRESSINGS) ×1 IMPLANT
DRAIN CHANNEL RND F F (WOUND CARE) ×2 IMPLANT
DRAPE C-ARM 42X120 X-RAY (DRAPES) ×2 IMPLANT
DRAPE LAPAROSCOPIC ABDOMINAL (DRAPES) ×2 IMPLANT
ELECT REM PT RETURN 9FT ADLT (ELECTROSURGICAL) ×2
ELECTRODE REM PT RTRN 9FT ADLT (ELECTROSURGICAL) ×1 IMPLANT
EVACUATOR SILICONE 100CC (DRAIN) ×2 IMPLANT
GLOVE BIOGEL PI IND STRL 6.5 (GLOVE) ×1 IMPLANT
GLOVE BIOGEL PI IND STRL 7.0 (GLOVE) ×1 IMPLANT
GLOVE BIOGEL PI INDICATOR 6.5 (GLOVE) ×1
GLOVE BIOGEL PI INDICATOR 7.0 (GLOVE) ×1
GLOVE EUDERMIC 7 POWDERFREE (GLOVE) ×2 IMPLANT
GLOVE SURG SIGNA 7.5 PF LTX (GLOVE) ×2 IMPLANT
GOWN PREVENTION PLUS LG XLONG (DISPOSABLE) ×2 IMPLANT
GOWN STRL REIN XL XLG (GOWN DISPOSABLE) ×6 IMPLANT
HEMOSTAT SNOW SURGICEL 2X4 (HEMOSTASIS) IMPLANT
IV LACTATED RINGER IRRG 3000ML (IV SOLUTION)
IV LR IRRIG 3000ML ARTHROMATIC (IV SOLUTION) IMPLANT
KIT BASIN OR (CUSTOM PROCEDURE TRAY) ×2 IMPLANT
NS IRRIG 1000ML POUR BTL (IV SOLUTION) ×2 IMPLANT
POUCH SPECIMEN RETRIEVAL 10MM (ENDOMECHANICALS) ×2 IMPLANT
SET CHOLANGIOGRAPH MIX (MISCELLANEOUS) ×2 IMPLANT
SET IRRIG TUBING LAPAROSCOPIC (IRRIGATION / IRRIGATOR) ×2 IMPLANT
SOLUTION ANTI FOG 6CC (MISCELLANEOUS) ×2 IMPLANT
SPONGE DRAIN TRACH 4X4 STRL 2S (GAUZE/BANDAGES/DRESSINGS) ×2 IMPLANT
STRIP CLOSURE SKIN 1/2X4 (GAUZE/BANDAGES/DRESSINGS) IMPLANT
SUT ETHILON 2 0 PS N (SUTURE) ×2 IMPLANT
SUT MNCRL AB 4-0 PS2 18 (SUTURE) ×2 IMPLANT
TAPE CLOTH SURG 4X10 WHT LF (GAUZE/BANDAGES/DRESSINGS) ×2 IMPLANT
TOWEL OR 17X26 10 PK STRL BLUE (TOWEL DISPOSABLE) ×2 IMPLANT
TOWEL OR NON WOVEN STRL DISP B (DISPOSABLE) ×2 IMPLANT
TRAY LAP CHOLE (CUSTOM PROCEDURE TRAY) ×2 IMPLANT
TROCAR BLADELESS OPT 5 75 (ENDOMECHANICALS) IMPLANT
TROCAR XCEL BLUNT TIP 100MML (ENDOMECHANICALS) ×2 IMPLANT
TROCAR XCEL NON-BLD 11X100MML (ENDOMECHANICALS) ×2 IMPLANT
TUBING INSUFFLATION 10FT LAP (TUBING) ×2 IMPLANT

## 2013-04-13 NOTE — Transfer of Care (Signed)
Immediate Anesthesia Transfer of Care Note  Patient: Dennis Winters  Procedure(s) Performed: Procedure(s) (LRB): LAPAROSCOPIC CHOLECYSTECTOMY , attempted IOC. (N/A)  Patient Location: PACU  Anesthesia Type: General  Level of Consciousness: sedated, patient cooperative and responds to stimulation  Airway & Oxygen Therapy: Patient Spontanous Breathing and Patient connected to face mask oxgen  Post-op Assessment: Report given to PACU RN and Post -op Vital signs reviewed and stable  Post vital signs: Reviewed and stable  Complications: No apparent anesthesia complications

## 2013-04-13 NOTE — Anesthesia Preprocedure Evaluation (Signed)
Anesthesia Evaluation  Patient identified by MRN, date of birth, ID band Patient awake    Reviewed: Allergy & Precautions, H&P , NPO status , Patient's Chart, lab work & pertinent test results  Airway Mallampati: III TM Distance: >3 FB Neck ROM: Full    Dental  (+) Teeth Intact and Dental Advisory Given   Pulmonary neg pulmonary ROS,  breath sounds clear to auscultation  Pulmonary exam normal       Cardiovascular hypertension, Rhythm:Regular Rate:Normal     Neuro/Psych negative neurological ROS  negative psych ROS   GI/Hepatic negative GI ROS, Neg liver ROS,   Endo/Other  negative endocrine ROS  Renal/GU negative Renal ROS  negative genitourinary   Musculoskeletal negative musculoskeletal ROS (+)   Abdominal   Peds  Hematology negative hematology ROS (+)   Anesthesia Other Findings   Reproductive/Obstetrics                           Anesthesia Physical Anesthesia Plan  ASA: II  Anesthesia Plan: General   Post-op Pain Management:    Induction: Intravenous  Airway Management Planned: Oral ETT  Additional Equipment:   Intra-op Plan:   Post-operative Plan: Extubation in OR  Informed Consent: I have reviewed the patients History and Physical, chart, labs and discussed the procedure including the risks, benefits and alternatives for the proposed anesthesia with the patient or authorized representative who has indicated his/her understanding and acceptance.   Dental advisory given  Plan Discussed with: CRNA  Anesthesia Plan Comments:         Anesthesia Quick Evaluation

## 2013-04-13 NOTE — Anesthesia Postprocedure Evaluation (Signed)
Anesthesia Post Note  Patient: Dennis Winters  Procedure(s) Performed: Procedure(s) (LRB): LAPAROSCOPIC CHOLECYSTECTOMY , attempted IOC. (N/A)  Anesthesia type: General  Patient location: PACU  Post pain: Pain level controlled  Post assessment: Post-op Vital signs reviewed  Last Vitals:  Filed Vitals:   04/13/13 1200  BP: 132/83  Pulse: 76  Temp: 37.4 C  Resp: 15    Post vital signs: Reviewed  Level of consciousness: sedated  Complications: No apparent anesthesia complications

## 2013-04-13 NOTE — Progress Notes (Signed)
  Subjective: Awake and alert. Pain is somewhat better.Afebrile. Vital signs stable. WBC 12,400.LFTs normal yesterday. Ultrasound shows thickened gallbladder wall at 6.8 mm. Probable gallstones. Positive sonographic Murphy's. CBD 4.3 mm.   Objective: Vital signs in last 24 hours: Temp:  [98.4 F (36.9 C)-99 F (37.2 C)] 98.4 F (36.9 C) (10/23 0045) Pulse Rate:  [67-85] 67 (10/23 0045) Resp:  [18-20] 20 (10/23 0045) BP: (133-161)/(83-103) 134/83 mmHg (10/23 0045) SpO2:  [90 %-96 %] 92 % (10/23 0045) Weight:  [199 lb 4.7 oz (90.4 kg)] 199 lb 4.7 oz (90.4 kg) (10/23 0045) Last BM Date: 04/12/13  Intake/Output from previous day:   Intake/Output this shift:    General appearance: alert. Mental status normal. Cooperative. Mild distress. GI: localized tenderness and guarding in right upper quadrant. Soft elsewhere. Do not feel a mass. No scars. No hernias.  Lab Results:   Recent Labs  04/12/13 2023 04/13/13 0400  WBC 13.7* 12.4*  HGB 17.8* 16.7  HCT 50.0 48.9  PLT 251 266   BMET  Recent Labs  04/12/13 2023  NA 131*  K 3.6  CL 94*  CO2 27  GLUCOSE 114*  BUN 7  CREATININE 0.98  CALCIUM 9.5   PT/INR No results found for this basename: LABPROT, INR,  in the last 72 hours ABG No results found for this basename: PHART, PCO2, PO2, HCO3,  in the last 72 hours  Studies/Results: US Abdomen Complete  04/12/2013   CLINICAL DATA:  Abdominal pain  EXAM: ULTRASOUND ABDOMEN COMPLETE  COMPARISON:  None.  FINDINGS: Gallbladder  Nonmobile echogenic foci on on the nondependent wall of gallbladder may represent adherent gallstones or polyps. Favor gallstones. Gallbladder wall is thickened measuring 6.8 mm. Positive sonographic Murphy sign. Findings suggest acute cholecystitis.  Common bile duct  Diameter: 4.3 mm  Liver  Echogenic liver without focal lesion.  IVC  Nonvisualized  Pancreas  Nonvisualized  Spleen  Size and appearance within normal limits.  Right Kidney  Length: 13.5  cm. Echogenicity within normal limits. No mass or hydronephrosis visualized.  Left Kidney  Length: 12.4 cm. Echogenicity within normal limits. No mass or hydronephrosis visualized.  Abdominal aorta  Nonvisualized  IMPRESSION: Gallstones versus gallbladder polyps. Gallbladder wall thickening and pain over the gallbladder suggestive of acute cholecystitis. Common bile duct nondilated.  Limited study due to bowel gas and body habitus.   Electronically Signed   By: Marlan Palau M.D.   On: 04/12/2013 22:29    Anti-infectives: Anti-infectives   Start     Dose/Rate Route Frequency Ordered Stop   04/12/13 2359  ciprofloxacin (CIPRO) IVPB 400 mg     400 mg 200 mL/hr over 60 Minutes Intravenous Every 12 hours 04/12/13 2256        Assessment/Plan:  Acute cholecystitis with cholelithiasis History of hypertension.  Will plan laparoscopic cholecystectomy with cholangiogram, possible open today. I discussed the indications, details, techniques, and numerous risks  of the surgery with the patient and his wife. He is aware of the problems of bleeding, infection, conversion to open laparotomy, bile leak, injury to adjacent structures major reconstructive surgery, wound healing problems and hernia, and unforeseen problems. He understands these issues well. His questions were answered. He agrees with this plan.   LOS: 1 day    Shakeila Pfarr M. Derrell Lolling, M.D., [facility] General and Minimally invasive Surgery Breast and Colorectal Surgery Office:   [phone]   04/13/2013

## 2013-04-13 NOTE — Preoperative (Signed)
Beta Blockers   Reason not to administer Beta Blockers:Not Applicable 

## 2013-04-13 NOTE — Progress Notes (Signed)
INITIAL NUTRITION ASSESSMENT  DOCUMENTATION CODES Per approved criteria  -Not Applicable   INTERVENTION: Resource Breeze po BID, each supplement provides 250 kcal and 9 grams of protein.   NUTRITION DIAGNOSIS: Inadequate oral intake related to inability to eat as evidenced by NPO followed by clear liquid diet.   Goal: Pt to meet >/= 90% of their estimated nutrition needs   Monitor:  Weight, po intake, labs  Reason for Assessment: MST  52 y.o. male  Admitting Dx: <principal problem not specified>  ASSESSMENT: Pt admitted with nausea, vomiting and abdominal pain. He underwent laparoscopic cholecystectomy this morning (10/23).   Pt reports that he is very hungry and that his appetite has returned. He reports his usual body weight as 210 lbs and his current body weight is 199 lbs due to loss of appetite. Pt would like to receive Resource Breeze supplements to increase his po intake. Per pt's wife, pt completed 100% of his tray with no problems.   Height: Ht Readings from Last 1 Encounters:  04/13/13 5\' 8"  (1.727 m)    Weight: Wt Readings from Last 1 Encounters:  04/13/13 199 lb 4.7 oz (90.4 kg)    Ideal Body Weight: 68.4 kg  % Ideal Body Weight: 132%  Wt Readings from Last 10 Encounters:  04/13/13 199 lb 4.7 oz (90.4 kg)  04/13/13 199 lb 4.7 oz (90.4 kg)    Usual Body Weight: 210 lbs  % Usual Body Weight: 95%  BMI:  Body mass index is 30.31 kg/(m^2).  Estimated Nutritional Needs: Kcal: 2200-2400 Protein: 110-120 g Fluid: >2.2 L  Skin: surgical incision on abdomen  Diet Order: Clear Liquid  EDUCATION NEEDS: -No education needs identified at this time   Intake/Output Summary (Last 24 hours) at 04/13/13 1538 Last data filed at 04/13/13 1200  Gross per 24 hour  Intake    800 ml  Output      0 ml  Net    800 ml    Last BM: none recorded   Labs:   Recent Labs Lab 04/12/13 2023 04/13/13 0400  NA 131*  --   K 3.6  --   CL 94*  --   CO2 27  --    BUN 7  --   CREATININE 0.98 1.01  CALCIUM 9.5  --   GLUCOSE 114*  --     CBG (last 3)  No results found for this basename: GLUCAP,  in the last 72 hours  Scheduled Meds: . ciprofloxacin  400 mg Intravenous Q12H  . docusate sodium  100 mg Oral BID  . [START ON 04/14/2013] enoxaparin (LOVENOX) injection  40 mg Subcutaneous Q24H  . famotidine  20 mg Oral Daily  . HYDROmorphone      .  HYDROmorphone (DILAUDID) injection  1 mg Intravenous Once    Continuous Infusions: . dextrose 5 % and 0.9 % NaCl with KCl 20 mEq/L 100 mL/hr at 04/13/13 1430    Past Medical History  Diagnosis Date  . Hypertension     Past Surgical History  Procedure Laterality Date  . Ankle surgery      Ebbie Latus RD, LDN

## 2013-04-13 NOTE — Op Note (Signed)
Patient Name:           Dennis Winters   Date of Surgery:        04/13/2013  Pre op Diagnosis:      Acute cholecystitis with cholelithiasis  Post op Diagnosis:    Acute cholecystitis with cholelithiasis, suspect gangrenous changes  Procedure:                 Laparoscopic cholecystectomy  Surgeon:                     Angelia Mould. Derrell Lolling, M.D., FACS  Assistant:                       Abigail Miyamoto, M.D., [facility]  Operative Indications:   This is a 52 year old Caucasian male who presents with a four-day history of steady unremitting right upper quadrant pain with some nausea and vomiting. On admission he was found to have localized tenderness and guarding in the right upper quadrant, elevated white blood cell count, andnormal liver function tests. Gallbladder ultrasound showed a thickened gallbladder wall, gallstones, and normal common bile duct. He was  Tender over the gallbladder at the time of the ultrasound. He was admitted last night and  started on antibiotics. He is brought to the operating room urgently this morning for cholecystectomy  Operative Findings:       The gallbladder was acutely inflamed and necrotic. It was purplish black in color. The wall was thin and tore easily. The cystic duct was somewhat necrotic. There were multifaceted yellow stones in the gallbladder. The liver looked healthy. The cystic duct was tiny. We were able to get a cholangiogram catheter in it, but despite multiple repositionings of the catheter could never get flow of contrast. We milked the cystic duct and found no  stones and it still appeared obstructed distally.  We abandoned cholangiogram attempts. The stomach, duodenum, small intestine, and large intestine all looked normal to inspection.  Procedure in Detail:          Following the induction of general endotracheal anesthesia the patient's abdomen was prepped and draped in a sterile fashion, surgical time out was performed. 0.5% Marcaine with epinephrine  was used as local infiltration anesthetic. A vertically oriented incision was made at the upper rim of the umbilicus. The fascia was incised in the midline and the abdominal cavity entered under direct vision. An 11 mm Hassan trocar was inserted and secured with a pursestring suture of 0 Vicryl. Pneumoperitoneum was created. The camera was inserted. An 11 mm trocar was placed in the subxiphoid region and two 5 mm trocars placed in the right upper quadrant. He slowly teased and dissected the omentum off of the fundus of the gallbladder. We peeled this down somewhat. The gallbladder was tense and the bile was evacuated with a suction trocar. This allowed [place] to manipulate the gallbladder somewhat better. We cleaned off the infundibulum. We slowly dissected the structures in the neck of the gallbladder. This was slow and tedious because of inflammation. We ultimately could get a nice window behind the cystic duct and we could see that the cystic duct was inserting into the gallbladder. We identified the cystic artery. We attempted a cholangiogram, made several attempts, but were  not able to get flow and so abandoned this. The cystic duct was secured with multiple hemoclips and divided. We isolated the anterior branch and the posterior branch of the cystic artery, secured them with metal clips  and divided them. We dissected the gallbladder from its bed with electrocautery, placed it in a specimen bag and removed it. During the case  the wall the gallbladder tore because it was necrotic and a couple of stones fell out and were retrieved. We irrigated the subhepatic space and subphrenic spaces copiously and found no more stones. Irrigation fluid was fairly clear. The bed of the gallbladder was rather raw. . Because of the necrotic cystic duct stump I chose to place a 19 [place] Blake drain in subhepatic space, brought out through the right upper quadrant trocar site, and sutured to the skin with nylon suture and connected  to suction bulb. We looked around one more time and saw no bleeding or fluid or abnormality. The pneumoperitoneum was released the trocars were removed. The fascia at the umbilicus was closed with 0 Vicryl sutures. After irrigating the skin incision we closed them with subcuticular sutures of 4-0 Monocryl and Dermabond. The patient tolerated the procedure well was taken to recovery in stable condition. EBL 30 cc to 40 cc. Counts correct. Complications none.     Angelia Mould. Derrell Lolling, M.D., FACS General and Minimally Invasive Surgery Breast and Colorectal Surgery  04/13/2013 11:25 AM

## 2013-04-14 ENCOUNTER — Encounter (HOSPITAL_COMMUNITY): Payer: Self-pay | Admitting: General Surgery

## 2013-04-14 MED ORDER — HYDROCODONE-ACETAMINOPHEN 7.5-325 MG/15ML PO SOLN: 10.0000 mL | ORAL | Status: DC | PRN

## 2013-04-14 MED ORDER — PROMETHAZINE HCL 12.5 MG PO TABS: 12.5000 mg | ORAL_TABLET | Freq: Four times a day (QID) | ORAL | Status: DC | PRN

## 2013-04-14 MED ORDER — HYDROCODONE-ACETAMINOPHEN 5-325 MG PO TABS: 1.0000 | ORAL_TABLET | ORAL | Status: DC | PRN

## 2013-04-14 NOTE — Progress Notes (Signed)
1 Day Post-Op  Subjective: Feels much better. Looking forward to solid, regular diet for breakfast. Pain markedly improved.  Had some itching with narcotic injection, it resolved with Benadryl.  Afebrile. Vital signs stable.  Objective: Vital signs in last 24 hours: Temp:  [97.9 F (36.6 C)-100 F (37.8 C)] 98.4 F (36.9 C) (10/24 0405) Pulse Rate:  [63-85] 63 (10/24 0405) Resp:  [15-20] 16 (10/24 0405) BP: (122-149)/(66-88) 131/66 mmHg (10/24 0405) SpO2:  [92 %-99 %] 93 % (10/24 0405) FiO2 (%):  [2 %] 2 % (10/23 1442) Last BM Date: 04/11/13  Intake/Output from previous day: 10/23 0701 - 10/24 0700 In: 2590 [P.O.:240; I.V.:2350] Out: 40 [Drains:40] Intake/Output this shift: Total I/O In: 1550 [I.V.:1550] Out: 40 [Drains:40]    EXAM: General appearance: alert. Smiling. Cooperative. Mental status normal. No distress. Resp: clear to auscultation bilaterally GI: abdomen soft. Not distended. Essentially nontender. JP draining serosanguineous, no bile.  Lab Results:  No results found for this or any previous visit (from the past 24 hour(s)).   Studies/Results: @RISRSLT24 @  . ciprofloxacin  400 mg Intravenous Q12H  . docusate sodium  100 mg Oral BID  . enoxaparin (LOVENOX) injection  40 mg Subcutaneous Q24H  . famotidine  20 mg Oral Daily  . feeding supplement (RESOURCE BREEZE)  1 Container Oral BID BM  .  HYDROmorphone (DILAUDID) injection  1 mg Intravenous Once     Assessment/Plan: s/p Procedure(s): LAPAROSCOPIC CHOLECYSTECTOMY , attempted IOC.  POD #1. Laparoscopic cholecystectomy for severe acute cholecystitis. Doing well Possible discharge today Leave drain.  followup with me in the office next week for drain removal Diet and activities discussed No indication for antibiotics as outpatient Vicodin for pain.  @PROBHOSP @  LOS: 2 days    Bee Marchiano M. Derrell Lolling, M.D., [facility] General and Minimally invasive Surgery Breast and  Colorectal Surgery Office:   [phone] Pager:   [phone]  04/14/2013  . .prob

## 2013-04-14 NOTE — ED Provider Notes (Signed)
Shared service with midlevel provider. I have personally seen and examined the patient, providing direct face to face care, presenting with the chief complaint of abdominal pain. Physical exam findings include RUQ abd tenderness, epigastric tenderness with [place] confirming cholecystitis. Plan will be to get Surgery involved. I have reviewed the nursing documentation on past medical history, family history, and social history.   Derwood Kaplan, MD 04/14/13 [phone]

## 2013-04-14 NOTE — Progress Notes (Signed)
Patient d/c,stable- Hulda Marin Rn

## 2013-04-14 NOTE — Progress Notes (Signed)
Patient's d/c instructions given and also care notes and health education about caring for his JP drain, verbalized understanding. Dsg to JP drain site changed,small amount of dried blood from old dsg. Patient is stable.Denies pain.Hulda Marin RN.

## 2013-04-14 NOTE — Discharge Summary (Signed)
Patient ID: Dennis Winters MRN: [id number] DOB/AGE: 10-31-1960 52 y.o.  Admit date: 04/12/2013 Discharge date: 04/14/2013  Procedures: lap chole with placement of a JP drain  Consults: None  Reason for Admission: patient presents to the ED with approximately 3 days of right upper quadrant pain. He states that this began on Sunday after attending a wedding. On Monday he had what a bit of nausea and vomiting. This resolved by Tuesday but he did have some diarrhea at this time. Since then he has had mainly persistent mid epigastric and right upper quadrant tenderness. This is worse with movement. It does not appear to get worse after meals. He has never had this pain before.  Admission Diagnoses:  1. Acute cholecystitis 2. HTN  Hospital Course: the patient was admitted and taken to the operating room the following day where he underwent a lap chole.  He had acute cholecystitis and a JP drain was placed.  He tolerated the procedure well.  On POD#1, his pain was minimal and he tolerated a regular diet.  He was stable for dc home.  Discharge Diagnoses:  1. Acute cholecystitis, s/p lap chole 2. HTN  Discharge Medications:   Medication List         acetaminophen 500 MG tablet  Commonly known as:  TYLENOL  Take 1,000 mg by mouth every 6 (six) hours as needed for pain.     HYDROcodone-acetaminophen 5-325 MG per tablet  Commonly known as:  NORCO  Take 1-2 tablets by mouth every 4 (four) hours as needed for pain.     ibuprofen 200 MG tablet  Commonly known as:  ADVIL,MOTRIN  Take 200 mg by mouth every 6 (six) hours as needed for pain.     promethazine 12.5 MG tablet  Commonly known as:  PHENERGAN  Take 1-2 tablets (12.5-25 mg total) by mouth every 6 (six) hours as needed for nausea.     ranitidine 150 MG tablet  Commonly known as:  ZANTAC  Take 150 mg by mouth 2 (two) times daily.        Discharge Instructions:     Follow-up Information   Follow up with Ernestene Mention, MD  In 1 week. (our office will call you)    Specialty:  General Surgery   Contact information:   [address] [place] [address] [phone]       Signed: Letha Cape 04/14/2013, 9:59 AM

## 2013-04-14 NOTE — Discharge Summary (Signed)
I have personally interviewed and examined this patient today. I agree with the assessment and discharge planning and followup as outlined by Ms. Keiser, PA   [place]. Derrell Lolling, M.D., [facility] General and Minimally invasive Surgery Breast and Colorectal Surgery Office:   [phone] Pager:   [phone]

## 2013-04-17 ENCOUNTER — Telehealth (INDEPENDENT_AMBULATORY_CARE_PROVIDER_SITE_OTHER): Payer: Self-pay

## 2013-04-17 NOTE — Telephone Encounter (Signed)
I called and spoke to the pt.  He is doing well.  He is emptying the drain bid.  It is still draining a clear, reddish color of drainage.  I scheduled him to come in Wednesday for probable drain removal.  I told him Dr Derrell Lolling will pull the drain as long as the drainage doesn't turn green.  I asked him to arrive at 1:15 for a 1:30 appointment.

## 2013-04-19 ENCOUNTER — Telehealth (INDEPENDENT_AMBULATORY_CARE_PROVIDER_SITE_OTHER): Payer: Self-pay

## 2013-04-19 ENCOUNTER — Encounter (INDEPENDENT_AMBULATORY_CARE_PROVIDER_SITE_OTHER): Payer: Self-pay | Admitting: General Surgery

## 2013-04-19 ENCOUNTER — Ambulatory Visit (INDEPENDENT_AMBULATORY_CARE_PROVIDER_SITE_OTHER): Payer: Self-pay | Admitting: General Surgery

## 2013-04-19 VITALS — BP 130/92 | HR 80 | Temp 98.9°F | Resp 15 | Ht 68.0 in | Wt 200.4 lb

## 2013-04-19 DIAGNOSIS — K8000 Calculus of gallbladder with acute cholecystitis without obstruction: Secondary | ICD-10-CM | POA: Insufficient documentation

## 2013-04-19 NOTE — Progress Notes (Signed)
Patient ID: Dennis Winters, male   DOB: [birth year], 52 y.o.   MRN: [id number] History: This gentleman presented with acute cholecystitis and cholelithiasis last week at [facility]. He underwent urgent laparoscopic cholecystectomy. The gallbladder was necrotic. We were unable to do a cholangiogram. We left the drain. He did very well and went home the next day. He states that he's gone back to work and doing office duty only. His appetite is good. He occasionally has some diarrhea. Minimal pain. The drainage is minimal.  Exam: Patient looks well. No distress. Good spirits Abdomen soft and nontender. All trocar sites are healing normally. Drainage is serosanguineous. Drain was removed.  Assessment: Severe acute cholecystitis with cholelithiasis. Recovering uneventfully following laparoscopic cholecystectomy  Plan: Diet activities were discussed.  Low-fat diet to minimize diarrhea.  No sports or heavy lifting until November 10 Return to see me if there are any problems.   Angelia Mould. Derrell Lolling, M.D., [facility] General and Minimally invasive Surgery Breast and Colorectal Surgery Office:   [phone] Pager:   [phone]

## 2013-04-19 NOTE — Telephone Encounter (Signed)
Called patient to see if we can r/s appointment due to delay in surgery.  Please see if patient can come in today at 2:15pm w/Dr. Derrell Lolling

## 2013-04-19 NOTE — Patient Instructions (Signed)
You seem to be recovering from your gallbladder surgery without any obvious surgical complications.  The drain was removed today. You may start taking showers tomorrow. Do not take a tub bath or get in a swimming pool for 2 weeks.  No sports or heavy lifting until after November 10. 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  Avoid fatty foods. They will cause diarrhea  Return to see Dr. Derrell Lolling if there are any problems.

## 2013-11-29 NOTE — Progress Notes (Signed)
Pt was a no-show for appointment scheduled for 11/30/2013 at 1730. 

## 2013-11-30 ENCOUNTER — Encounter (HOSPITAL_COMMUNITY): Payer: Self-pay | Admitting: Dietician

## 2015-08-14 ENCOUNTER — Encounter: Payer: Self-pay | Admitting: Gastroenterology

## 2015-09-02 ENCOUNTER — Encounter: Payer: Self-pay | Admitting: Gastroenterology

## 2016-05-26 ENCOUNTER — Emergency Department (HOSPITAL_COMMUNITY): Payer: 59

## 2016-05-26 ENCOUNTER — Inpatient Hospital Stay (HOSPITAL_COMMUNITY)
Admission: EM | Admit: 2016-05-26 | Discharge: 2016-06-04 | DRG: 234 | Disposition: A | Discharge status: Home / Self Care | Payer: 59 | Attending: Surgery | Admitting: Surgery

## 2016-05-26 ENCOUNTER — Encounter (HOSPITAL_COMMUNITY): Payer: Self-pay | Admitting: *Deleted

## 2016-05-26 DIAGNOSIS — Z7982 Long term (current) use of aspirin: Secondary | ICD-10-CM

## 2016-05-26 DIAGNOSIS — F17210 Nicotine dependence, cigarettes, uncomplicated: Secondary | ICD-10-CM | POA: Diagnosis present

## 2016-05-26 DIAGNOSIS — N2889 Other specified disorders of kidney and ureter: Secondary | ICD-10-CM | POA: Diagnosis present

## 2016-05-26 DIAGNOSIS — I10 Essential (primary) hypertension: Secondary | ICD-10-CM | POA: Diagnosis present

## 2016-05-26 DIAGNOSIS — E669 Obesity, unspecified: Secondary | ICD-10-CM | POA: Diagnosis present

## 2016-05-26 DIAGNOSIS — I214 Non-ST elevation (NSTEMI) myocardial infarction: Secondary | ICD-10-CM

## 2016-05-26 DIAGNOSIS — I25110 Atherosclerotic heart disease of native coronary artery with unstable angina pectoris: Secondary | ICD-10-CM | POA: Diagnosis present

## 2016-05-26 DIAGNOSIS — R079 Chest pain, unspecified: Secondary | ICD-10-CM | POA: Diagnosis not present

## 2016-05-26 DIAGNOSIS — Z6831 Body mass index (BMI) 31.0-31.9, adult: Secondary | ICD-10-CM

## 2016-05-26 DIAGNOSIS — E782 Mixed hyperlipidemia: Secondary | ICD-10-CM | POA: Diagnosis present

## 2016-05-26 DIAGNOSIS — Z880 Allergy status to penicillin: Secondary | ICD-10-CM

## 2016-05-26 DIAGNOSIS — K219 Gastro-esophageal reflux disease without esophagitis: Secondary | ICD-10-CM | POA: Diagnosis present

## 2016-05-26 DIAGNOSIS — I2510 Atherosclerotic heart disease of native coronary artery without angina pectoris: Secondary | ICD-10-CM

## 2016-05-26 DIAGNOSIS — I252 Old myocardial infarction: Secondary | ICD-10-CM

## 2016-05-26 DIAGNOSIS — R0600 Dyspnea, unspecified: Secondary | ICD-10-CM

## 2016-05-26 DIAGNOSIS — E119 Type 2 diabetes mellitus without complications: Secondary | ICD-10-CM | POA: Diagnosis present

## 2016-05-26 DIAGNOSIS — Z951 Presence of aortocoronary bypass graft: Secondary | ICD-10-CM

## 2016-05-26 DIAGNOSIS — J449 Chronic obstructive pulmonary disease, unspecified: Secondary | ICD-10-CM | POA: Diagnosis present

## 2016-05-26 DIAGNOSIS — R001 Bradycardia, unspecified: Secondary | ICD-10-CM | POA: Diagnosis not present

## 2016-05-26 DIAGNOSIS — J9811 Atelectasis: Secondary | ICD-10-CM | POA: Diagnosis not present

## 2016-05-26 DIAGNOSIS — E8770 Fluid overload, unspecified: Secondary | ICD-10-CM | POA: Diagnosis not present

## 2016-05-26 DIAGNOSIS — E7800 Pure hypercholesterolemia, unspecified: Secondary | ICD-10-CM | POA: Diagnosis present

## 2016-05-26 HISTORY — DX: Pure hypercholesterolemia, unspecified: E78.00

## 2016-05-26 HISTORY — DX: Atherosclerotic heart disease of native coronary artery without angina pectoris: I25.10

## 2016-05-26 LAB — BASIC METABOLIC PANEL
Anion gap: 8 (ref 5–15)
BUN: 7 mg/dL (ref 6–20)
CO2: 25 mmol/L (ref 22–32)
Calcium: 8.8 mg/dL — ABNORMAL LOW (ref 8.9–10.3)
Chloride: 107 mmol/L (ref 101–111)
Creatinine, Ser: 0.81 mg/dL (ref 0.61–1.24)
GFR calc Af Amer: >60 mL/min (ref >60)
GFR calc non Af Amer: >60 mL/min (ref >60)
GLUCOSE: 114 mg/dL — AB (ref 65–99)
POTASSIUM: 3.6 mmol/L (ref 3.5–5.1)
SODIUM: 140 mmol/L (ref 135–145)

## 2016-05-26 LAB — CBC
HEMATOCRIT: 45.6 % (ref 39.0–52.0)
Hemoglobin: 15.3 g/dL (ref 13.0–17.0)
MCH: 32.4 pg (ref 26.0–34.0)
MCHC: 33.6 g/dL (ref 30.0–36.0)
MCV: 96.6 fL (ref 78.0–100.0)
Platelets: 291 10*3/uL (ref 150–400)
RBC: 4.72 MIL/uL (ref 4.22–5.81)
RDW: 13 % (ref 11.5–15.5)
WBC: 7.6 10*3/uL (ref 4.0–10.5)

## 2016-05-26 LAB — TROPONIN I
Troponin I: 0.22 ng/mL (ref <0.03)
Troponin I: 0.23 ng/mL (ref <0.03)

## 2016-05-26 LAB — HEPATIC FUNCTION PANEL
ALBUMIN: 3.6 g/dL (ref 3.5–5.0)
ALT: 17 U/L (ref 17–63)
AST: 18 U/L (ref 15–41)
Alkaline Phosphatase: 76 U/L (ref 38–126)
BILIRUBIN TOTAL: 0.3 mg/dL (ref 0.3–1.2)
TOTAL PROTEIN: 6.7 g/dL (ref 6.5–8.1)

## 2016-05-26 LAB — LIPASE, BLOOD: Lipase: 36 U/L (ref 11–51)

## 2016-05-26 LAB — CK: CK TOTAL: 117 U/L (ref 49–397)

## 2016-05-26 MED ORDER — IOPAMIDOL (ISOVUE-370) INJECTION 76%
100.0000 mL | Freq: Once | INTRAVENOUS | Status: AC | PRN
  Administered 2016-05-26: 100 mL via INTRAVENOUS

## 2016-05-26 MED ORDER — SODIUM CHLORIDE 0.9 % IV BOLUS (SEPSIS)
1000.0000 mL | Freq: Once | INTRAVENOUS | Status: AC
  Administered 2016-05-26: 1000 mL via INTRAVENOUS

## 2016-05-26 MED ORDER — MORPHINE SULFATE (PF) 4 MG/ML IV SOLN
4.0000 mg | Freq: Once | INTRAVENOUS | Status: AC
  Administered 2016-05-26: 4 mg via INTRAVENOUS
  Filled 2016-05-26: qty 1

## 2016-05-26 NOTE — ED Notes (Signed)
Family at bedside. 

## 2016-05-26 NOTE — ED Triage Notes (Signed)
Pt reports mid chest pain that started 2 days ago with left arm numbness. Pt has non productive cough that has been going on for 1 week. Pt reports some nausea. Denies vomiting, diarrhea. Pt reports the pain is worse with movement of his left arm.

## 2016-05-26 NOTE — ED Notes (Signed)
While being placed on the monitor the patient started c/o of left arm pain, tingling in his left leg, and seeing flashing lights

## 2016-05-26 NOTE — ED Notes (Signed)
Lab called to notify we have had a chest pain in the waiting room waiting on labs.

## 2016-05-26 NOTE — ED Notes (Signed)
Patient transported to CT 

## 2016-05-26 NOTE — ED Provider Notes (Signed)
[facility]-EMERG[facility]Y DEPT Provider Note   CSN: [id number] Arrival date & time: 05/26/16  1856  By signing my name below, I, Dennis Winters, attest that this documentation has been prepared under the direction and in the presence of physician practitioner, Loren Racer, MD. Electronically Signed: Linna Winters, Scribe. 05/26/2016. 10:16 PM.  History   Chief Complaint Chief Complaint  Patient presents with  . Chest Pain    The history is provided by the patient. No language interpreter was used.     HPI Comments: Dennis Winters is a 55 y.o. male brought in by family, with PMHx significant for HTN and HLD, who presents to the Emergency Department complaining of constant, worsening, severe, left-sided chest pain beginning yesterday around 6 PM. Pt states "it feels like acid reflux in my chest." He reports associated pain to his left upper extremity as well as subjective fever/chills, diaphoresis, and a productive cough with yellow/green sputum. He notes some intermittent numbness in his left foot but denies any other numbness. He endorses CP exacerbated with palpation to his chest but denies pain exacerbation with deep inhalation. No h/o similar pain in the past. He reports he tried to carry a case of bottled water in his arms last night and could not due to weakness of his LUE. No h/o similar chest or extremity pain in the past. He reports he has smoked 0.5 ppd for 15 years. He notes his sister passed away at age 39 due to pulmonary issues but he denies any known FMHx of heart disease. No recent immobilization. He denies lower back pain, abdominal pain, nausea, vomiting, or any other associated symptoms.  Past Medical History:  Diagnosis Date  . High cholesterol   . Hypertension     Patient Active Problem List   Diagnosis Date Noted  . 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 NSTEMI (non-ST elevated myocardial infarction) (HCC)   . Chest pain 05/27/2016  . High cholesterol   . Essential hypertension     . Calculus of gallbladder with acute cholecystitis, without mention of obstruction 04/19/2013    Past Surgical History:  Procedure Laterality Date  . ANKLE SURGERY    . CARDIAC CATHETERIZATION N/A 05/28/2016   Procedure: Left Heart Cath and Coronary Angiography;  Surgeon: Kathleene Hazel, MD;  Location: [facility] INVASIVE CV LAB;  Service: Cardiovascular;  Laterality: N/A;  . CHOLECYSTECTOMY N/A 04/13/2013   Procedure: LAPAROSCOPIC CHOLECYSTECTOMY , attempted IOC.;  Surgeon: Ernestene Mention, MD;  Location: [facility];  Service: General;  Laterality: N/A;       Home Medications    Prior to Admission medications   Medication Sig Start Date End Date Taking? Authorizing Provider  aspirin EC 81 MG tablet Take 81 mg by mouth daily.   Yes Historical Provider, MD  ibuprofen (ADVIL,MOTRIN) 200 MG tablet Take 800 mg by mouth daily as needed for mild pain or moderate pain.    Yes Historical Provider, MD  lisinopril (PRINIVIL,ZESTRIL) 10 MG tablet Take 10 mg by mouth daily.   Yes Historical Provider, MD  Multiple Vitamin (MULTIVITAMIN WITH MINERALS) TABS tablet Take 1 tablet by mouth daily.   Yes Historical Provider, MD  niacin 500 MG tablet Take 500 mg by mouth daily.   Yes Historical Provider, MD  simvastatin (ZOCOR) 40 MG tablet Take 40 mg by mouth daily.   Yes Historical Provider, MD  HYDROcodone-acetaminophen (NORCO/VICODIN) 5-325 MG tablet Take 1 tablet by mouth every 6 (six) hours as needed for moderate pain. 05/28/16   [name]  [name], MD    Family History Family History  Problem Relation Age of Onset  . Pulmonary disease Maternal Aunt   . CAD Neg Hx     Social History Social History  Substance Use Topics  . Smoking status: Current Every Day Smoker    Packs/day: 0.50  . Smokeless tobacco: Never Used  . Alcohol use 3.6 oz/week    6 Cans of beer per week     Allergies   Penicillins   Review of Systems Review of Systems  Constitutional: Positive for chills, diaphoresis,  fatigue and fever.  HENT: Negative for congestion, facial swelling, sinus pain, sore throat and trouble swallowing.   Eyes: Negative for visual disturbance.  Respiratory: Positive for cough and shortness of breath. Negative for chest tightness and wheezing.   Cardiovascular: Positive for chest pain. Negative for palpitations and leg swelling.  Gastrointestinal: Negative for abdominal pain, constipation, diarrhea, nausea and vomiting.  Genitourinary: Negative for dysuria, flank pain, frequency and hematuria.  Musculoskeletal: Positive for back pain and myalgias. Negative for neck pain and neck stiffness.  Skin: Negative for rash and wound.  Neurological: Negative for dizziness, light-headedness, numbness and headaches.  All other systems reviewed and are negative.    Physical Exam Updated Vital Signs BP 111/70   Pulse 79   Temp 97.2 F (36.2 C) (Oral)   Resp (!) 29   Ht 5\' 8"  (1.727 m)   Wt 215 lb 13.3 oz (97.9 kg)   SpO2 92%   BMI 32.82 kg/m   Physical Exam  Constitutional: He is oriented to person, place, and time. He appears well-developed and well-nourished.  HENT:  Head: Normocephalic and atraumatic.  Mouth/Throat: Oropharynx is clear and moist. No oropharyngeal exudate.  Eyes: EOM are normal. Pupils are equal, round, and reactive to light.  Neck: Normal range of motion. Neck supple. No JVD present.  Cardiovascular: Normal rate and regular rhythm.  Exam reveals no gallop and no friction rub.   No murmur heard. Pulmonary/Chest: Effort normal. He exhibits tenderness (chest tenderness to palpation of the left upper chest. There is no crepitance or deformity.).  Coarse breath sounds left base.  Abdominal: Soft. Bowel sounds are normal. There is no tenderness. There is no rebound and no guarding.  Musculoskeletal: Normal range of motion. He exhibits tenderness. He exhibits no edema.  Left-sided thoracic back tenderness diffusely. This is mostly on the medial surface of the  scapula. No midline thoracic or lumbar tenderness. 2+ distal pulses in all extremities. No swelling, asymmetry or tenderness.  Neurological: He is alert and oriented to person, place, and time.  Biceps 5 motor in all extremities. Glove- like decreased sensation to the left upper extremity.   Skin: Skin is warm and dry. Capillary refill takes less than 2 seconds. No rash noted. No erythema.  Psychiatric: He has a normal mood and affect. His behavior is normal.  Nursing note and vitals reviewed.    ED Treatments / Results  Labs (all labs ordered are listed, but only abnormal results are displayed) Labs Reviewed  SURGICAL PCR SCREEN - Abnormal; Notable for the following:       Result Value   Staphylococcus aureus POSITIVE (*)    All other components within normal limits  BASIC METABOLIC PANEL - Abnormal; Notable for the following:    Glucose, Bld 114 (*)    Calcium 8.8 (*)    All other components within normal limits  TROPONIN I - Abnormal; Notable for the following:    Troponin  I 0.22 (*)    All other components within normal limits  TROPONIN I - Abnormal; Notable for the following:    Troponin I 0.23 (*)    All other components within normal limits  HEPATIC FUNCTION PANEL - Abnormal; Notable for the following:    Bilirubin, Direct <0.1 (*)    All other components within normal limits  TROPONIN I - Abnormal; Notable for the following:    Troponin I 0.22 (*)    All other components within normal limits  TROPONIN I - Abnormal; Notable for the following:    Troponin I 0.26 (*)    All other components within normal limits  TROPONIN I - Abnormal; Notable for the following:    Troponin I 0.27 (*)    All other components within normal limits  TROPONIN I - Abnormal; Notable for the following:    Troponin I 0.25 (*)    All other components within normal limits  BASIC METABOLIC PANEL - Abnormal; Notable for the following:    Glucose, Bld 113 (*)    Calcium 8.7 (*)    All other  components within normal limits  HEPARIN LEVEL (UNFRACTIONATED) - Abnormal; Notable for the following:    Heparin Unfractionated <0.10 (*)    All other components within normal limits  BASIC METABOLIC PANEL - Abnormal; Notable for the following:    Glucose, Bld 118 (*)    BUN <5 (*)    Calcium 8.6 (*)    All other components within normal limits  BLOOD GAS, ARTERIAL - Abnormal; Notable for the following:    pH, Arterial 7.474 (*)    pO2, Arterial 80.0 (*)    All other components within normal limits  URINALYSIS, ROUTINE W REFLEX MICROSCOPIC - Abnormal; Notable for the following:    Color, Urine STRAW (*)    All other components within normal limits  HEMOGLOBIN AND HEMATOCRIT, BLOOD - Abnormal; Notable for the following:    Hemoglobin 12.4 (*)    HCT 36.5 (*)    All other components within normal limits  CBC - Abnormal; Notable for the following:    WBC 15.6 (*)    RBC 4.02 (*)    Hemoglobin 12.7 (*)    HCT 38.6 (*)    All other components within normal limits  PROTIME-INR - Abnormal; Notable for the following:    Prothrombin Time 17.1 (*)    All other components within normal limits  CBC - Abnormal; Notable for the following:    WBC 19.4 (*)    All other components within normal limits  MAGNESIUM - Abnormal; Notable for the following:    Magnesium 3.1 (*)    All other components within normal limits  GLUCOSE, CAPILLARY - Abnormal; Notable for the following:    Glucose-Capillary 127 (*)    All other components within normal limits  CBC - Abnormal; Notable for the following:    WBC 15.0 (*)    RBC 4.17 (*)    All other components within normal limits  BASIC METABOLIC PANEL - Abnormal; Notable for the following:    Glucose, Bld 120 (*)    Calcium 8.0 (*)    All other components within normal limits  GLUCOSE, CAPILLARY - Abnormal; Notable for the following:    Glucose-Capillary 118 (*)    All other components within normal limits  GLUCOSE, CAPILLARY - Abnormal; Notable  for the following:    Glucose-Capillary 130 (*)    All other components within normal limits  GLUCOSE,  CAPILLARY - Abnormal; Notable for the following:    Glucose-Capillary 122 (*)    All other components within normal limits  GLUCOSE, CAPILLARY - Abnormal; Notable for the following:    Glucose-Capillary 129 (*)    All other components within normal limits  GLUCOSE, CAPILLARY - Abnormal; Notable for the following:    Glucose-Capillary 129 (*)    All other components within normal limits  GLUCOSE, CAPILLARY - Abnormal; Notable for the following:    Glucose-Capillary 150 (*)    All other components within normal limits  GLUCOSE, CAPILLARY - Abnormal; Notable for the following:    Glucose-Capillary 146 (*)    All other components within normal limits  CBC - Abnormal; Notable for the following:    WBC 13.9 (*)    RBC 3.78 (*)    Hemoglobin 12.3 (*)    HCT 36.6 (*)    All other components within normal limits  GLUCOSE, CAPILLARY - Abnormal; Notable for the following:    Glucose-Capillary 125 (*)    All other components within normal limits  GLUCOSE, CAPILLARY - Abnormal; Notable for the following:    Glucose-Capillary 109 (*)    All other components within normal limits  GLUCOSE, CAPILLARY - Abnormal; Notable for the following:    Glucose-Capillary 114 (*)    All other components within normal limits  GLUCOSE, CAPILLARY - Abnormal; Notable for the following:    Glucose-Capillary 122 (*)    All other components within normal limits  GLUCOSE, CAPILLARY - Abnormal; Notable for the following:    Glucose-Capillary 127 (*)    All other components within normal limits  GLUCOSE, CAPILLARY - Abnormal; Notable for the following:    Glucose-Capillary 131 (*)    All other components within normal limits  GLUCOSE, CAPILLARY - Abnormal; Notable for the following:    Glucose-Capillary 131 (*)    All other components within normal limits  GLUCOSE, CAPILLARY - Abnormal; Notable for the  following:    Glucose-Capillary 118 (*)    All other components within normal limits  GLUCOSE, CAPILLARY - Abnormal; Notable for the following:    Glucose-Capillary 114 (*)    All other components within normal limits  GLUCOSE, CAPILLARY - Abnormal; Notable for the following:    Glucose-Capillary 131 (*)    All other components within normal limits  GLUCOSE, CAPILLARY - Abnormal; Notable for the following:    Glucose-Capillary 118 (*)    All other components within normal limits  GLUCOSE, CAPILLARY - Abnormal; Notable for the following:    Glucose-Capillary 113 (*)    All other components within normal limits  GLUCOSE, CAPILLARY - Abnormal; Notable for the following:    Glucose-Capillary 102 (*)    All other components within normal limits  POCT I-STAT, CHEM 8 - Abnormal; Notable for the following:    BUN 4 (*)    Glucose, Bld 114 (*)    All other components within normal limits  POCT I-STAT 3, ART BLOOD GAS (G3+) - Abnormal; Notable for the following:    pH, Arterial 7.349 (*)    pCO2 arterial 51.7 (*)    pO2, Arterial 310.0 (*)    Bicarbonate 28.5 (*)    All other components within normal limits  POCT I-STAT, CHEM 8 - Abnormal; Notable for the following:    BUN 4 (*)    All other components within normal limits  POCT I-STAT, CHEM 8 - Abnormal; Notable for the following:    BUN 4 (*)  All other components within normal limits  POCT I-STAT, CHEM 8 - Abnormal; Notable for the following:    BUN 4 (*)    Glucose, Bld 100 (*)    All other components within normal limits  POCT I-STAT, CHEM 8 - Abnormal; Notable for the following:    BUN 3 (*)    Creatinine, Ser 0.50 (*)    Calcium, Ion 1.01 (*)    Hemoglobin 11.2 (*)    HCT 33.0 (*)    All other components within normal limits  POCT I-STAT 3, ART BLOOD GAS (G3+) - Abnormal; Notable for the following:    pO2, Arterial 506.0 (*)    Bicarbonate 31.2 (*)    Acid-Base Excess 6.0 (*)    All other components within normal  limits  POCT I-STAT, CHEM 8 - Abnormal; Notable for the following:    BUN 4 (*)    Creatinine, Ser 0.60 (*)    Glucose, Bld 142 (*)    Calcium, Ion 1.05 (*)    Hemoglobin 11.6 (*)    HCT 34.0 (*)    All other components within normal limits  POCT I-STAT, CHEM 8 - Abnormal; Notable for the following:    BUN 4 (*)    Glucose, Bld 129 (*)    Calcium, Ion 1.12 (*)    Hemoglobin 11.6 (*)    HCT 34.0 (*)    All other components within normal limits  POCT I-STAT 3, ART BLOOD GAS (G3+) - Abnormal; Notable for the following:    pO2, Arterial 75.0 (*)    All other components within normal limits  POCT I-STAT 4, (NA,K, GLUC, HGB,HCT) - Abnormal; Notable for the following:    Glucose, Bld 111 (*)    HCT 38.0 (*)    Hemoglobin 12.9 (*)    All other components within normal limits  POCT I-STAT 3, ART BLOOD GAS (G3+) - Abnormal; Notable for the following:    pH, Arterial 7.322 (*)    pCO2 arterial 51.5 (*)    pO2, Arterial 71.0 (*)    All other components within normal limits  POCT I-STAT 3, ART BLOOD GAS (G3+) - Abnormal; Notable for the following:    pO2, Arterial 65.0 (*)    All other components within normal limits  POCT I-STAT 3, ART BLOOD GAS (G3+) - Abnormal; Notable for the following:    pO2, Arterial 67.0 (*)    All other components within normal limits  POCT I-STAT, CHEM 8 - Abnormal; Notable for the following:    Potassium 5.4 (*)    Glucose, Bld 125 (*)    All other components within normal limits  POCT I-STAT, CHEM 8 - Abnormal; Notable for the following:    Chloride 99 (*)    Glucose, Bld 102 (*)    Hemoglobin 12.2 (*)    HCT 36.0 (*)    All other components within normal limits  CBC  CK  LIPASE, BLOOD  CK TOTAL AND CKMB (NOT AT [facility])  CBC  PROTIME-INR  CBC  PLATELET COUNT  APTT  CREATININE, SERUM  MAGNESIUM  MAGNESIUM  CREATININE, SERUM  HEMOGLOBIN A1C  BASIC METABOLIC PANEL  CBC  TYPE AND SCREEN  ABO/RH    EKG  EKG  Interpretation  Date/Time:  Tuesday May 26 2016 22:09:12 EST Ventricular Rate:  72 PR Interval:  144 QRS Duration: 88 QT Interval:  431 QTC Calculation: 472 R Axis:   -15 Text Interpretation:  Sinus rhythm Borderline left axis deviation Low  voltage, precordial leads Abnormal R-wave progression, early transition Confirmed by Ranae Palms  MD, Logen Heintzelman ([id number]) on 05/26/2016 11:10:10 PM       Radiology Dg Chest Port 1 View  Result Date: 05/30/2016 CLINICAL DATA:  CABG EXAM: PORTABLE CHEST 1 VIEW COMPARISON:  05/29/2016 FINDINGS: Endotracheal tube and NG tubes removed. Swan-Ganz catheter tip in the main pulmonary artery unchanged. Mediastinal drain. Bilateral chest tubes in place. No pneumothorax Hypoventilation with bibasilar atelectasis which has progressed. IMPRESSION: Progression of bibasilar atelectasis following extubation. No pneumothorax. Electronically Signed   By: Marlan Palau M.D.   On: 05/30/2016 07:39   Dg Chest Port 1 View  Result Date: 05/29/2016 CLINICAL DATA:  Status post CABG x3.  Chest tube. EXAM: PORTABLE CHEST 1 VIEW COMPARISON:  05/26/2016 FINDINGS: Status post median sternotomy and CABG. Endotracheal tube is in place with tip approximately 4.5 cm above the carina. Nasogastric tube is in place with tip beyond the gastroesophageal junction and off the image. A right IJ Swan-Ganz catheter tip overlies the level of the pulmonary outflow tract. Mediastinal drains and chest tubes are in place. Epicardial pacing leads are also present. There is bibasilar atelectasis, left greater than right. No significant pulmonary edema. IMPRESSION: 1. Postoperative appearance of the chest. 2. Bibasilar atelectasis, left greater than right. 3. No pneumothorax. Electronically Signed   By: Norva Pavlov M.D.   On: 05/29/2016 14:53    Procedures Procedures (including critical care time)  DIAGNOSTIC STUDIES: Oxygen Saturation is 97% on RA, normal by my interpretation.    COORDINATION OF  CARE: 10:28 PM Discussed treatment plan with pt at bedside and pt agreed to plan.  Medications Ordered in ED Medications  0.9 %  sodium chloride infusion ( Intravenous Stopped 05/28/16 1538)  atorvastatin (LIPITOR) tablet 80 mg (80 mg Oral Not Given 05/30/16 1731)  0.45 % sodium chloride infusion ( Intravenous Stopped 05/30/16 1120)  lactated ringers infusion ( Intravenous Rate/Dose Verify 05/30/16 1800)  lactated ringers infusion ( Intravenous Stopped 05/30/16 0800)  sodium chloride flush (NS) 0.9 % injection 3 mL (3 mLs Intravenous Given 05/30/16 0954)  sodium chloride flush (NS) 0.9 % injection 3 mL (not administered)  0.9 %  sodium chloride infusion (250 mLs Intravenous Stopped 05/30/16 0800)  0.9 %  sodium chloride infusion ( Intravenous Stopped 05/30/16 1120)  insulin regular (NOVOLIN R,HUMULIN R) 250 Units in sodium chloride 0.9 % 250 mL (1 Units/mL) infusion (0 Units/hr Intravenous Stopped 05/30/16 1259)  phenylephrine (NEO-SYNEPHRINE) 20 mg in dextrose 5 % 250 mL (0.08 mg/mL) infusion (0 mcg/min Intravenous Stopped 05/30/16 1259)  albumin human 5 % solution 250 mL (not administered)  acetaminophen (TYLENOL) tablet 1,000 mg (1,000 mg Oral Given 05/30/16 1731)    Or  acetaminophen (TYLENOL) solution 1,000 mg ( Per Tube See Alternative 05/30/16 1731)  traMADol (ULTRAM) tablet 50-100 mg (not administered)  oxyCODONE (Oxy IR/ROXICODONE) immediate release tablet 5-10 mg (10 mg Oral Given 05/30/16 0654)  midazolam (VERSED) injection 2 mg (not administered)  docusate sodium (COLACE) capsule 200 mg (200 mg Oral Given 05/30/16 0937)  bisacodyl (DULCOLAX) EC tablet 10 mg (10 mg Oral Given 05/30/16 0937)    Or  bisacodyl (DULCOLAX) suppository 10 mg ( Rectal See Alternative 05/30/16 0937)  ondansetron (ZOFRAN) injection 4 mg (not administered)  aspirin EC tablet 325 mg (325 mg Oral Given 05/30/16 0937)    Or  aspirin chewable tablet 324 mg ( Per Tube See Alternative 05/30/16 0937)  metoprolol (LOPRESSOR)  injection 2.5-5 mg (not administered)  metoprolol tartrate (LOPRESSOR)  tablet 12.5 mg (12.5 mg Oral Not Given 05/30/16 1000)    Or  metoprolol tartrate (LOPRESSOR) 25 mg/10 mL oral suspension 12.5 mg ( Per Tube See Alternative 05/30/16 1000)  pantoprazole (PROTONIX) EC tablet 40 mg (not administered)  mupirocin ointment (BACTROBAN) 2 % 1 application (1 application Nasal Given 05/30/16 0937)  Chlorhexidine Gluconate Cloth 2 % PADS 6 each (6 each Topical Given 05/30/16 1000)  MEDLINE mouth rinse (15 mLs Mouth Rinse Given 05/30/16 1000)  isosorbide mononitrate (IMDUR) 24 hr tablet 15 mg (15 mg Oral Given 05/30/16 1140)  ketorolac (TORADOL) 15 MG/ML injection 15 mg (15 mg Intravenous Given 05/30/16 1733)  insulin aspart (novoLOG) injection 0-24 Units (0 Units Subcutaneous Not Given 05/30/16 1600)  insulin detemir (LEVEMIR) injection 8 Units (8 Units Subcutaneous Given 05/30/16 1101)  fentaNYL (SUBLIMAZE) injection 75 mcg (75 mcg Intravenous Given 05/30/16 1051)  furosemide (LASIX) injection 20 mg (20 mg Intravenous Given 05/30/16 1734)  morphine 4 MG/ML injection 4 mg (4 mg Intravenous Given 05/26/16 2238)  sodium chloride 0.9 % bolus 1,000 mL (0 mLs Intravenous Stopped 05/26/16 2323)  iopamidol (ISOVUE-370) 76 % injection 100 mL (100 mLs Intravenous Contrast Given 05/26/16 2328)  aspirin chewable tablet 324 mg (324 mg Oral Given 05/27/16 0035)  aspirin chewable tablet 324 mg (324 mg Oral Given 05/28/16 0634)  chlorpheniramine-HYDROcodone (TUSSIONEX) 10-8 MG/5ML suspension 5 mL (5 mLs Oral Given 05/27/16 2144)  albuterol (PROVENTIL) (2.5 MG/3ML) 0.083% nebulizer solution 2.5 mg (2.5 mg Nebulization Given 05/28/16 1445)  chlorhexidine (PERIDEX) 0.12 % solution 15 mL (15 mLs Mouth/Throat Given 05/29/16 0500)  bisacodyl (DULCOLAX) EC tablet 5 mg (5 mg Oral Given 05/28/16 2105)  metoprolol tartrate (LOPRESSOR) tablet 12.5 mg (12.5 mg Oral Given 05/29/16 1610)  Chlorhexidine Gluconate Cloth 2 % PADS 6 each (6 each Topical  Given 05/28/16 2200)  diazepam (VALIUM) tablet 10 mg (10 mg Oral Given 05/29/16 0631)  dexmedetomidine (PRECEDEX) 400 MCG/100ML (4 mcg/mL) infusion (0.7 mcg/kg/hr  93.6 kg Intravenous Rate/Dose Change 05/29/16 1241)  insulin regular (NOVOLIN R,HUMULIN R) 250 Units in sodium chloride 0.9 % 250 mL (1 Units/mL) infusion (0.7 Units/hr Intravenous Rate/Dose Change 05/29/16 1800)  nitroGLYCERIN 50 mg in dextrose 5 % 250 mL (0.2 mg/mL) infusion (16.6 mcg/min Intravenous Rate/Dose Change 05/29/16 1323)  phenylephrine (NEO-SYNEPHRINE) 20 mg in dextrose 5 % 250 mL (0.08 mg/mL) infusion (40 mcg/min Intravenous Rate/Dose Change 05/29/16 1432)  heparin 2,500 Units, papaverine 30 mg in electrolyte-148 (PLASMALYTE-148) 500 mL irrigation (500 mLs Irrigation Given 05/29/16 0909)  tranexamic acid (CYKLOKAPRON) bolus via infusion - over 30 minutes 1,404 mg (1,404 mg Intravenous Given 05/29/16 0825)  vancomycin (VANCOCIN) 1,500 mg in sodium chloride 0.9 % 250 mL IVPB (1,500 mg Intravenous New Bag/Given 05/29/16 0745)  levofloxacin (LEVAQUIN) IVPB 500 mg (500 mg Intravenous Given 05/29/16 0810)  potassium chloride 30 mEq in sodium chloride 0.9 % 265 mL (KCL MULTIRUN) IVPB (30 mEq Intravenous Given 05/29/16 1518)  magnesium sulfate IVPB 4 g 100 mL (4 g Intravenous Given 05/29/16 1522)  vancomycin (VANCOCIN) IVPB 1000 mg/200 mL premix (1,000 mg Intravenous Given 05/29/16 2141)  acetaminophen (TYLENOL) solution 650 mg ( Per Tube See Alternative 05/29/16 1455)    Or  acetaminophen (TYLENOL) suppository 650 mg (650 mg Rectal Given 05/29/16 1455)  chlorhexidine (PERIDEX) 0.12 % solution 15 mL (15 mLs Mouth/Throat Given 05/29/16 1500)  levofloxacin (LEVAQUIN) IVPB 750 mg (750 mg Intravenous Given 05/30/16 0936)  albuterol (PROVENTIL) (2.5 MG/3ML) 0.083% nebulizer solution (2.5 mg  Given 05/30/16 0540)  Initial Impression / Assessment and Plan / ED Course  I have reviewed the triage vital signs and the nursing notes.  Pertinent labs  & imaging results that were available during my care of the patient were reviewed by me and considered in my medical decision making (see chart for details).  Clinical Course     I personally performed the services described in this documentation, which was scribed in my presence. The recorded information has been reviewed and is accurate.   Discussed with cardiologist. Recommend repeating troponin. If negative believe patient can be worked up for his chest pain and [employer]. Signed out to oncoming emergency physician pending repeat troponin.  Final Clinical Impressions(s) / ED Diagnoses   Final diagnoses:  High cholesterol  Essential hypertension  Coronary artery disease involving native heart without angina pectoris, unspecified vessel or lesion type  S/P CABG x 3  Chest pain  Coronary artery disease with history of myocardial infarction without history of CABG    New Prescriptions Current Discharge Medication List    START taking these medications   Details  HYDROcodone-acetaminophen (NORCO/VICODIN) 5-325 MG tablet Take 1 tablet by mouth every 6 (six) hours as needed for moderate pain. Qty: 30 tablet, Refills: 0         Loren Racer, MD 05/30/16 1842

## 2016-05-26 NOTE — ED Notes (Signed)
Patient inquiring about wait time. States he is starting to feel worse.

## 2016-05-27 ENCOUNTER — Encounter (HOSPITAL_COMMUNITY): Payer: Self-pay | Admitting: Family Medicine

## 2016-05-27 ENCOUNTER — Ambulatory Visit (HOSPITAL_COMMUNITY): Payer: 59

## 2016-05-27 DIAGNOSIS — K219 Gastro-esophageal reflux disease without esophagitis: Secondary | ICD-10-CM | POA: Diagnosis not present

## 2016-05-27 DIAGNOSIS — I25110 Atherosclerotic heart disease of native coronary artery with unstable angina pectoris: Secondary | ICD-10-CM | POA: Diagnosis not present

## 2016-05-27 DIAGNOSIS — E782 Mixed hyperlipidemia: Secondary | ICD-10-CM | POA: Diagnosis present

## 2016-05-27 DIAGNOSIS — Z880 Allergy status to penicillin: Secondary | ICD-10-CM | POA: Diagnosis not present

## 2016-05-27 DIAGNOSIS — J9811 Atelectasis: Secondary | ICD-10-CM | POA: Diagnosis not present

## 2016-05-27 DIAGNOSIS — I10 Essential (primary) hypertension: Secondary | ICD-10-CM | POA: Diagnosis not present

## 2016-05-27 DIAGNOSIS — I200 Unstable angina: Secondary | ICD-10-CM

## 2016-05-27 DIAGNOSIS — R079 Chest pain, unspecified: Secondary | ICD-10-CM | POA: Diagnosis not present

## 2016-05-27 DIAGNOSIS — E669 Obesity, unspecified: Secondary | ICD-10-CM | POA: Diagnosis not present

## 2016-05-27 DIAGNOSIS — I214 Non-ST elevation (NSTEMI) myocardial infarction: Secondary | ICD-10-CM | POA: Diagnosis not present

## 2016-05-27 DIAGNOSIS — N2889 Other specified disorders of kidney and ureter: Secondary | ICD-10-CM | POA: Diagnosis not present

## 2016-05-27 DIAGNOSIS — R001 Bradycardia, unspecified: Secondary | ICD-10-CM | POA: Diagnosis not present

## 2016-05-27 DIAGNOSIS — F17210 Nicotine dependence, cigarettes, uncomplicated: Secondary | ICD-10-CM | POA: Diagnosis not present

## 2016-05-27 DIAGNOSIS — E7800 Pure hypercholesterolemia, unspecified: Secondary | ICD-10-CM | POA: Diagnosis present

## 2016-05-27 DIAGNOSIS — Z7982 Long term (current) use of aspirin: Secondary | ICD-10-CM | POA: Diagnosis not present

## 2016-05-27 DIAGNOSIS — E8770 Fluid overload, unspecified: Secondary | ICD-10-CM | POA: Diagnosis not present

## 2016-05-27 DIAGNOSIS — J449 Chronic obstructive pulmonary disease, unspecified: Secondary | ICD-10-CM | POA: Diagnosis not present

## 2016-05-27 DIAGNOSIS — E119 Type 2 diabetes mellitus without complications: Secondary | ICD-10-CM | POA: Diagnosis not present

## 2016-05-27 DIAGNOSIS — Z6831 Body mass index (BMI) 31.0-31.9, adult: Secondary | ICD-10-CM | POA: Diagnosis not present

## 2016-05-27 LAB — TROPONIN I
TROPONIN I: 0.22 ng/mL — AB (ref <0.03)
TROPONIN I: 0.26 ng/mL — AB (ref <0.03)
TROPONIN I: 0.27 ng/mL — AB (ref <0.03)
Troponin I: 0.25 ng/mL (ref <0.03)

## 2016-05-27 LAB — CK TOTAL AND CKMB (NOT AT ARMC)
CK, MB: 2.4 ng/mL (ref 0.5–5.0)
RELATIVE INDEX: INVALID (ref 0.0–2.5)
Total CK: 99 U/L (ref 49–397)

## 2016-05-27 MED ORDER — HYDROCOD POLST-CPM POLST ER 10-8 MG/5ML PO SUER
5.0000 mL | Freq: Once | ORAL | Status: AC
  Administered 2016-05-27: 5 mL via ORAL
  Filled 2016-05-27: qty 5

## 2016-05-27 MED ORDER — ASPIRIN 81 MG PO CHEW
324.0000 mg | CHEWABLE_TABLET | Freq: Once | ORAL | Status: AC
  Administered 2016-05-27: 324 mg via ORAL

## 2016-05-27 MED ORDER — SODIUM CHLORIDE 0.9 % WEIGHT BASED INFUSION
3.0000 mL/kg/h | INTRAVENOUS | Status: DC
  Administered 2016-05-28: 3 mL/kg/h via INTRAVENOUS

## 2016-05-27 MED ORDER — SODIUM CHLORIDE 0.9 % IV SOLN: 250.0000 mL | INTRAVENOUS | Status: DC | PRN

## 2016-05-27 MED ORDER — ONDANSETRON HCL 4 MG/2ML IJ SOLN: 4.0000 mg | Freq: Four times a day (QID) | INTRAMUSCULAR | Status: DC | PRN

## 2016-05-27 MED ORDER — ALBUTEROL SULFATE (2.5 MG/3ML) 0.083% IN NEBU: 3.0000 mL | INHALATION_SOLUTION | RESPIRATORY_TRACT | Status: DC | PRN

## 2016-05-27 MED ORDER — KETOROLAC TROMETHAMINE 30 MG/ML IJ SOLN
30.0000 mg | Freq: Once | INTRAMUSCULAR | Status: DC
  Filled 2016-05-27: qty 1

## 2016-05-27 MED ORDER — MORPHINE SULFATE (PF) 2 MG/ML IV SOLN
2.0000 mg | INTRAVENOUS | Status: DC | PRN
  Administered 2016-05-27 – 2016-05-29 (×4): 2 mg via INTRAVENOUS
  Filled 2016-05-27 (×4): qty 1

## 2016-05-27 MED ORDER — ASPIRIN 81 MG PO CHEW
324.0000 mg | CHEWABLE_TABLET | ORAL | Status: AC
  Administered 2016-05-28: 324 mg via ORAL
  Filled 2016-05-27: qty 4

## 2016-05-27 MED ORDER — SODIUM CHLORIDE 0.9% FLUSH: 3.0000 mL | INTRAVENOUS | Status: DC | PRN

## 2016-05-27 MED ORDER — LISINOPRIL 10 MG PO TABS
10.0000 mg | ORAL_TABLET | Freq: Every day | ORAL | Status: DC
  Administered 2016-05-27 – 2016-05-28 (×2): 10 mg via ORAL
  Filled 2016-05-27 (×2): qty 1

## 2016-05-27 MED ORDER — ACETAMINOPHEN 325 MG PO TABS: 650.0000 mg | ORAL_TABLET | ORAL | Status: DC | PRN

## 2016-05-27 MED ORDER — ASPIRIN EC 81 MG PO TBEC
81.0000 mg | DELAYED_RELEASE_TABLET | Freq: Every day | ORAL | Status: DC
Start: 2016-05-27 — End: 2016-05-29
  Administered 2016-05-27 – 2016-05-28 (×2): 81 mg via ORAL
  Filled 2016-05-27 (×2): qty 1

## 2016-05-27 MED ORDER — SODIUM CHLORIDE 0.9% FLUSH
3.0000 mL | Freq: Two times a day (BID) | INTRAVENOUS | Status: DC
  Administered 2016-05-27: 3 mL via INTRAVENOUS

## 2016-05-27 MED ORDER — SODIUM CHLORIDE 0.9 % WEIGHT BASED INFUSION
1.0000 mL/kg/h | INTRAVENOUS | Status: DC
  Administered 2016-05-28: 1 mL/kg/h via INTRAVENOUS

## 2016-05-27 MED ORDER — SIMVASTATIN 40 MG PO TABS
40.0000 mg | ORAL_TABLET | Freq: Every day | ORAL | Status: DC
  Filled 2016-05-27: qty 1

## 2016-05-27 NOTE — ED Notes (Signed)
Dr Onalee Huaavid at bedside talking to family at this time. She was informed of the patient having a bed [facility] and we were waiting to find out how long transport will take to get him there. Dr Onalee Huaavid stated that the patient had bronchitis as she was walking out of the nursing station.

## 2016-05-27 NOTE — ED Notes (Signed)
Family at bedside. 

## 2016-05-27 NOTE — Consult Note (Signed)
    CARDIOLOGY CONSULT NOTE   Patient ID: Bastien Schoonmaker MRN: [id number] DOB/AGE: 01/10/1961 55 y.o.  Admit date: 05/26/2016  Primary Physician   Strader, Lindsey F, NP Primary Cardiologist   New Reason for Consultation   Chest pain and elevated troponin Requesting MD: Dr Myers  HPI:Dennis Winters is a 55 y.o. year old male with a history of HTN, HLD, tob use. He quit taking Zocor about a week ago because of GERD sx and water brash. He also got arm numbness with certain positions, other positions would make it stop, but he is having a hard time finding them.  Pt has not had a history of chest pain. He has been able to climb a flight of stairs without difficulty. He was able to carry 2 cases of bottled water without a problem last week.   He has had a cold last 8-10 days, it has been gradually improving. Sputum was green till today, he had some bloody sputum this am.  Monday, for the first time, he had chest pain with exertion. He was having DOE before then, but no pain. He got 2 cases of water out of the truck and carried it, but did not get 10 feet. His chest began hurting, a throbbing pain. It went from the center of his chest to his L shoulder and arm. 10/10 at first. Pt went and laid down, the pain would decrease and he was able to sleep. By the next morning, it was gone. Tried Ibuprofen, not sure it helped.   Tuesday, he went to work, his chest/shoulder and arm started hurting about 10 am. He tried sitting in the office as much as possible, made it through the day. After he went home, the pain got worse>>ER. He got morphine in the ER and slept, the pain went away.   When he lays on his L side, he will get L arm pain, and chest pain. Not as severe as the original pain, but similar.   Past Medical History:  Diagnosis Date  . High cholesterol   . Hypertension      Past Surgical History:  Procedure Laterality Date  . ANKLE SURGERY    . CHOLECYSTECTOMY N/A 04/13/2013   Procedure: LAPAROSCOPIC CHOLECYSTECTOMY , attempted IOC.;  Surgeon: Haywood M Ingram, MD;  Location: [facility];  Service: General;  Laterality: N/A;    Allergies  Allergen Reactions  . Penicillins     Has patient had a PCN reaction causing immediate rash, facial/tongue/throat swelling, SOB or lightheadedness with hypotension: Yes Has patient had a PCN reaction causing severe rash involving mucus membranes or skin necrosis: Yes Has patient had a PCN reaction that required hospitalization No Has patient had a PCN reaction occurring within the last 10 years: Non  If all of the above answers are "NO", then may proceed with Cephalosporin use.     I have reviewed the patient's current medications . aspirin EC  81 mg Oral Daily  . ketorolac  30 mg Intravenous Once  . lisinopril  10 mg Oral Daily  . simvastatin  40 mg Oral q1800    acetaminophen, albuterol, morphine injection, ondansetron (ZOFRAN) IV  Prior to Admission medications   Medication Sig Start Date End Date Taking? Authorizing Provider  aspirin EC 81 MG tablet Take 81 mg by mouth daily.   Yes Historical Provider, MD  ibuprofen (ADVIL,MOTRIN) 200 MG tablet Take 800 mg by mouth daily as needed for mild pain or moderate pain.    Yes   Historical Provider, MD  lisinopril (PRINIVIL,ZESTRIL) 10 MG tablet Take 10 mg by mouth daily.   Yes Historical Provider, MD  Multiple Vitamin (MULTIVITAMIN WITH MINERALS) TABS tablet Take 1 tablet by mouth daily.   Yes Historical Provider, MD  niacin 500 MG tablet Take 500 mg by mouth daily.   Yes Historical Provider, MD  simvastatin (ZOCOR) 40 MG tablet Take 40 mg by mouth daily.   Yes Historical Provider, MD     Social History   Social History  . Marital status: Single    Spouse name: N/A  . Number of children: N/A  . Years of education: N/A   Occupational History  . Project Manager w/ [employer]     Strenuous at times   Social History Main Topics  . Smoking status: Current Every Day  Smoker    Packs/day: 0.50  . Smokeless tobacco: Never Used  . Alcohol use 3.6 oz/week    6 Cans of beer per week  . Drug use: No  . Sexual activity: Not on file   Other Topics Concern  . Not on file   Social History Narrative   Pt lives with son.     Family Status  Relation Status  . Mother Alive  . Father Alive  . Brother Alive  . Maternal Aunt Deceased  . Neg Hx    Family History  Problem Relation Age of Onset  . Pulmonary disease Maternal Aunt   . CAD Neg Hx      ROS:  Full 14 point review of systems complete and found to be negative unless listed above.  Physical Exam: Blood pressure (!) 100/59, pulse 69, temperature 98.1 F (36.7 C), temperature source Oral, resp. rate 14, height 5' 8" (1.727 m), weight 211 lb 4.8 oz (95.8 kg), SpO2 95 %.  General: Well developed, well nourished, male in no acute distress Head: Eyes PERRLA, No xanthomas.   Normocephalic and atraumatic, oropharynx without edema or exudate. Dentition: good Lungs: Scattered rales, good air exchange  Heart: HRRR S1 S2, no rub/gallop, no murmur. pulses are 2+ all 4 extrem.   Neck: No carotid bruits. No lymphadenopathy.  JVD not elevated Abdomen: Bowel sounds present, abdomen soft and non-tender without masses or hernias noted. Msk:  No spine or cva tenderness. No weakness, no joint deformities or effusions. There is an area of tenderness in his upper L chest, very close to the L shoulder. Extremities: No clubbing or cyanosis. No edema.  Neuro: Alert and oriented X 3. No focal deficits noted. Psych:  Good affect, responds appropriately Skin: No rashes or lesions noted.  Labs:   Lab Results  Component Value Date   WBC 7.6 05/26/2016   HGB 15.3 05/26/2016   HCT 45.6 05/26/2016   MCV 96.6 05/26/2016   PLT 291 05/26/2016     Recent Labs Lab 05/26/16 2120 05/26/16 2226  NA 140  --   K 3.6  --   CL 107  --   CO2 25  --   BUN 7  --   CREATININE 0.81  --   CALCIUM 8.8*  --   PROT  --  6.7    BILITOT  --  0.3  ALKPHOS  --  76  ALT  --  17  AST  --  18  GLUCOSE 114*  --   ALBUMIN  --  3.6   No results found for: MG  Recent Labs  05/26/16 2226 05/27/16 0116 05/27/16 0529 05/27/16 0802 05/27/16 1202    CKTOTAL 117 99  --   --   --   CKMB  --  2.4  --   --   --   TROPONINI  --  0.22* 0.26* 0.27* 0.25*   Lipase  Date/Time Value Ref Range Status  05/26/2016 10:26 PM 36 11 - 51 U/L Final   Echo: n/a  ECG:  12/05 SR, no acute changes  Cath: n/a  Radiology:  Dg Chest 2 View Result Date: 05/26/2016 CLINICAL DATA:  Mid chest pain started 2 days ago with left arm numbness. EXAM: CHEST  2 VIEW COMPARISON:  None. FINDINGS: Low volume film. Interstitial markings are diffusely coarsened with chronic features. 1-2 cm nodular density projects at the left lung base. Question pleural thickening left upper hemi thorax. The cardiopericardial silhouette is within normal limits for size. The visualized bony structures of the thorax are intact. IMPRESSION: 1-2 cm nodular density left lung base. CT chest without contrast recommended to further evaluate. Low volume film with chronic interstitial coarsening. Electronically Signed   By: Eric  Mansell M.D.   On: 05/26/2016 20:00   Ct Angio Chest Pe W And/or Wo Contrast Result Date: 05/27/2016 CLINICAL DATA:  Worsening left-sided chest pain, onset at 18:00. EXAM: CT ANGIOGRAPHY CHEST WITH CONTRAST TECHNIQUE: Multidetector CT imaging of the chest was performed using the standard protocol during bolus administration of intravenous contrast. Multiplanar CT image reconstructions and MIPs were obtained to evaluate the vascular anatomy. CONTRAST:  100 mL Isovue 370 intravenous COMPARISON:  Radiographs 05/26/2016 FINDINGS: Cardiovascular: Satisfactory opacification of the pulmonary arteries to the segmental level. No evidence of pulmonary embolism. Normal heart size. No pericardial effusion. Mediastinum/Nodes: No enlarged mediastinal, hilar, or axillary  lymph nodes. Thyroid gland, trachea, and esophagus demonstrate no significant findings. Lungs/Pleura: Lungs are clear. No pleural effusion or pneumothorax. Upper Abdomen: No acute abnormality. There is a 2 cm mass at the upper pole the right kidney which is only marginally imaged at the bottom of this study, but it probably measures soft tissue density. This requires additional evaluation to exclude a significant renal neoplasm. Musculoskeletal: No chest wall abnormality. No acute or significant osseous findings. Review of the MIP images confirms the above findings. IMPRESSION: 1. **An incidental finding of potential clinical significance has been found. There is a 2 cm upper pole right renal mass which requires additional evaluation to exclude a significant neoplasm. Abdominal MRI or CT without and with contrast is recommended. This can be performed on a nonemergent basis.** 2. Negative for pulmonary embolism or other acute abnormality in the chest. 3. These results will be called to the ordering clinician or representative by the Radiologist Assistant, and communication documented in the PACS or zVision Dashboard. Electronically Signed   By: Daniel R Mitchell M.D.   On: 05/27/2016 00:16    ASSESSMENT AND PLAN:   The patient was seen today by Dr Jamai Dolce, the patient evaluated and the data reviewed.   Principal Problem: 1.  Chest pain - ECG not acute - Pain reproducible w/ palpation - Enzymes are mildly elevated - With abnl ez, probably needs cath.  Active Problems: 2.  High cholesterol - would f/u with primary MD - try another statin  3.  Essential hypertension - BP control borderline in hospital - some resting bradycardia, so no BB - MD advise on increasing lisinopril  Signed: Barrett, Rhonda, PA-C 05/27/2016 4:40 PM Beeper [phone]  Co-Sign MD  I have seen and examined the patient along with Barrett, Rhonda, PA-C.  I have reviewed the chart,   notes and new data.  I agree with PA's  note.  Key new complaints: symptoms are worrisome for unstable angina, appears more likely than a musculoskeletal cause by his description Key examination changes: normal exam Key new findings / data: mild "plateau" elevation in troponin, subtle ST depression V4-V5 on arrival to ED has resolved, age-advanced calcification in distribution of LAD on CT scan. Incidentally noted 2 cm round mass in upper pole of right kidney will require further evaluation  PLAN: Coronary angio in AM for unstable angina with high risk ECG and biochemical markers. This procedure has been fully reviewed with the patient and written informed consent has been obtained. Start IV heparin with abnormal enzymes. Cannot increase beta blocker with low heart rate, hold off increasing ACEi due to upcoming cath.  Kahil Agner, MD, FACC [facility] [phone] 05/27/2016, 6:28 PM  

## 2016-05-27 NOTE — Progress Notes (Signed)
Patient admitted to the floor rm 2w35, denies chest pain, appears sleepy but not in any apparent distress. Patient's vital signs obtained, placed on tele monitor, in bed with call bell in reach. [facility] p[facility] that patient has arrived to the floor. Will continue to monitor.

## 2016-05-27 NOTE — Progress Notes (Signed)
Pt seen and examined at bedside, admitted after midnight, please see earlier admission note by Dr. Onalee Huaavid. Pt admitted for evaluation of chest pain, elevated troponins: 0.22 --> 0.26 --> 0.27. Cardiology consulted, will follow up on recommendations, appreciate assistance. VSS this AM, blood work on admission stable.   Debbora PrestoMAGICK-MYERS, ISKRA, MD  [facility] Pager [phone]  If 7PM-7AM, please contact night-coverage [email or web address] Password [email or web address]

## 2016-05-27 NOTE — ED Notes (Signed)
Pt's wife talked to Dr. Onalee Huaavid refused to talk to her in the ER she would only talk to her over the phone. AC present for this exchange between the doctor and the family.

## 2016-05-27 NOTE — H&P (Signed)
History and Physical    Dennis Winters ZOX:[id number] DOB: [birth year] DOA: 05/26/2016  PCP: Erasmo DownerStrader, Lindsey F, NP  Patient coming from:  home  Chief Complaint:  Chest pain, cough  HPI: Dennis QuickMarshall Digman is a 55 y.o. male with medical history significant of HTN, HLD comes in with over a day of sscp and pain to his shoulder, worse with coughing.  Pt says he has been coughing for weeks.  He is a smoker.  No formal dx of copd.  No fevers.  No swelling in his legs.  Pt vague about his symptoms.  At one point he said he actually has been having this pain for months but his wife interupts and says this is not true.  No n/v/d.  Pain is worse when I palpate his anterior chest wall.  Pt referred for admission for possible ACS.   Pt has no h/o prior cardiac work up.  Review of Systems: As per HPI otherwise 10 point review of systems negative.   Past Medical History:  Diagnosis Date  . High cholesterol   . Hypertension     Past Surgical History:  Procedure Laterality Date  . ANKLE SURGERY    . CHOLECYSTECTOMY N/A 04/13/2013   Procedure: LAPAROSCOPIC CHOLECYSTECTOMY , attempted IOC.;  Surgeon: Ernestene MentionHaywood M Ingram, MD;  Location: [facility];  Service: General;  Laterality: N/A;     reports that he has been smoking.  He has been smoking about 0.50 packs per day. He has never used smokeless tobacco. He reports that he drinks about 3.6 oz of alcohol per week . He reports that he does not use drugs.  Allergies  Allergen Reactions  . Penicillins     Has patient had a PCN reaction causing immediate rash, facial/tongue/throat swelling, SOB or lightheadedness with hypotension: Yes Has patient had a PCN reaction causing severe rash involving mucus membranes or skin necrosis: Yes Has patient had a PCN reaction that required hospitalization No Has patient had a PCN reaction occurring within the last 10 years: Non  If all of the above answers are "NO", then may proceed with Cephalosporin use.     No family  history on file. no premature CAD  Prior to Admission medications   Medication Sig Start Date End Date Taking? Authorizing Provider  aspirin EC 81 MG tablet Take 81 mg by mouth daily.   Yes Historical Provider, MD  ibuprofen (ADVIL,MOTRIN) 200 MG tablet Take 800 mg by mouth daily as needed for mild pain or moderate pain.    Yes Historical Provider, MD  lisinopril (PRINIVIL,ZESTRIL) 10 MG tablet Take 10 mg by mouth daily.   Yes Historical Provider, MD  Multiple Vitamin (MULTIVITAMIN WITH MINERALS) TABS tablet Take 1 tablet by mouth daily.   Yes Historical Provider, MD  niacin 500 MG tablet Take 500 mg by mouth daily.   Yes Historical Provider, MD  simvastatin (ZOCOR) 40 MG tablet Take 40 mg by mouth daily.   Yes Historical Provider, MD    Physical Exam: Vitals:   05/26/16 2300 05/26/16 2341 05/27/16 0000 05/27/16 0129  BP: 134/89 125/87 120/82 127/88  Pulse:  67 70 (!) 59  Resp: 17 17 15 16   Temp:      TempSrc:      SpO2:  99% 96% 98%  Weight:      Height:        Constitutional: NAD, calm, comfortable Vitals:   05/26/16 2300 05/26/16 2341 05/27/16 0000 05/27/16 0129  BP: 134/89 125/87 120/82 127/88  Pulse:  67 70 (!) 59  Resp: 17 17 15 16   Temp:      TempSrc:      SpO2:  99% 96% 98%  Weight:      Height:       Eyes: PERRL, lids and conjunctivae normal ENMT: Mucous membranes are moist. Posterior pharynx clear of any exudate or lesions.Normal dentition.  Neck: normal, supple, no masses, no thyromegaly Respiratory: clear to auscultation bilaterally, mild wheezing, no crackles. Normal respiratory effort. No accessory muscle use.  Cardiovascular: Regular rate and rhythm, no murmurs / rubs / gallops. No extremity edema. 2+ pedal pulses. No carotid bruits.  Abdomen: no tenderness, no masses palpated. No hepatosplenomegaly. Bowel sounds positive.  Musculoskeletal: no clubbing / cyanosis. No joint deformity upper and lower extremities. Good ROM, no contractures. Normal muscle tone.    Pain with palp to anterior left chest wall reproducible pain Skin: no rashes, lesions, ulcers. No induration Neurologic: CN 2-12 grossly intact. Sensation intact, DTR normal. Strength 5/5 in all 4.  Psychiatric: Normal judgment and insight. Alert and oriented x 3. Normal mood.    Labs on Admission: I have personally reviewed following labs and imaging studies  CBC:  Recent Labs Lab 05/26/16 2120  WBC 7.6  HGB 15.3  HCT 45.6  MCV 96.6  PLT 291   Basic Metabolic Panel:  Recent Labs Lab 05/26/16 2120  NA 140  K 3.6  CL 107  CO2 25  GLUCOSE 114*  BUN 7  CREATININE 0.81  CALCIUM 8.8*   GFR: Estimated Creatinine Clearance: 115.3 mL/min (by C-G formula based on SCr of 0.81 mg/dL). Liver Function Tests:  Recent Labs Lab 05/26/16 2226  AST 18  ALT 17  ALKPHOS 76  BILITOT 0.3  PROT 6.7  ALBUMIN 3.6    Recent Labs Lab 05/26/16 2226  LIPASE 36   Cardiac Enzymes:  Recent Labs Lab 05/26/16 2120 05/26/16 2219 05/26/16 2226 05/27/16 0116  CKTOTAL  --   --  117  --   TROPONINI 0.22* 0.23*  --  0.22*   Radiological Exams on Admission: Dg Chest 2 View  Result Date: 05/26/2016 CLINICAL DATA:  Mid chest pain started 2 days ago with left arm numbness. EXAM: CHEST  2 VIEW COMPARISON:  None. FINDINGS: Low volume film. Interstitial markings are diffusely coarsened with chronic features. 1-2 cm nodular density projects at the left lung base. Question pleural thickening left upper hemi thorax. The cardiopericardial silhouette is within normal limits for size. The visualized bony structures of the thorax are intact. IMPRESSION: 1-2 cm nodular density left lung base. CT chest without contrast recommended to further evaluate. Low volume film with chronic interstitial coarsening. Electronically Signed   By: Kennith Center M.D.   On: 05/26/2016 20:00   Ct Angio Chest Pe W And/or Wo Contrast  Result Date: 05/27/2016 CLINICAL DATA:  Worsening left-sided chest pain, onset at 18:00.  EXAM: CT ANGIOGRAPHY CHEST WITH CONTRAST TECHNIQUE: Multidetector CT imaging of the chest was performed using the standard protocol during bolus administration of intravenous contrast. Multiplanar CT image reconstructions and MIPs were obtained to evaluate the vascular anatomy. CONTRAST:  100 mL Isovue 370 intravenous COMPARISON:  Radiographs 05/26/2016 FINDINGS: Cardiovascular: Satisfactory opacification of the pulmonary arteries to the segmental level. No evidence of pulmonary embolism. Normal heart size. No pericardial effusion. Mediastinum/Nodes: No enlarged mediastinal, hilar, or axillary lymph nodes. Thyroid gland, trachea, and esophagus demonstrate no significant findings. Lungs/Pleura: Lungs are clear. No pleural effusion or pneumothorax. Upper Abdomen: No acute abnormality. There  is a 2 cm mass at the upper pole the right kidney which is only marginally imaged at the bottom of this study, but it probably measures soft tissue density. This requires additional evaluation to exclude a significant renal neoplasm. Musculoskeletal: No chest wall abnormality. No acute or significant osseous findings. Review of the MIP images confirms the above findings. IMPRESSION: 1. **An incidental finding of potential clinical significance has been found. There is a 2 cm upper pole right renal mass which requires additional evaluation to exclude a significant neoplasm. Abdominal MRI or CT without and with contrast is recommended. This can be performed on a nonemergent basis.** 2. Negative for pulmonary embolism or other acute abnormality in the chest. 3. These results will be called to the ordering clinician or representative by the Radiologist Assistant, and communication documented in the PACS or zVision Dashboard. Electronically Signed   By: Ellery Plunkaniel R Mitchell M.D.   On: 05/27/2016 00:16    EKG: Independently reviewed. nsr no acute issues  Assessment/Plan 55 yo male with multiple risk factors comes in with atypical  chest pain and mildly elevated troponin Principal Problem:   Chest pain-- trop marginally elevated at 0.2, repeated and the same.  ekg nonischemic.  Per history this does not sound cardiac.  Pt and wife wish to go to [place] for evaluation.  Transfer to [facility].  Obtain echo in the am.  Give a dose of toradol.  Cardiology dr Mayford Knifeturner at [facility] was already called earlier by dr Ranae Palmsyelverton, they are aware of consult.  Aspirin.  Cards to evaluate the need for further cardiac stress testing.  Active Problems:   High cholesterol - cont home statin   Hypertension- stable   Incidental renal mass - needs outpt follow up  DVT prophylaxis: scds Code Status:  full Family Communication: wife Disposition Plan:  Per day team Consults called: cardiology at [facility] Admission status:  observation   Elyas Villamor A MD [facility]  If 7PM-7AM, please contact night-coverage [email or web address] Password [email or web address]  05/27/2016, 2:04 AM

## 2016-05-28 ENCOUNTER — Observation Stay (HOSPITAL_COMMUNITY): Payer: 59

## 2016-05-28 ENCOUNTER — Encounter (HOSPITAL_COMMUNITY)
Admission: EM | Disposition: A | Discharge status: Home / Self Care | Payer: Self-pay | Source: Home / Self Care | Attending: Surgery

## 2016-05-28 ENCOUNTER — Other Ambulatory Visit (HOSPITAL_COMMUNITY): Payer: Self-pay | Admitting: Radiology

## 2016-05-28 ENCOUNTER — Ambulatory Visit (HOSPITAL_BASED_OUTPATIENT_CLINIC_OR_DEPARTMENT_OTHER): Payer: 59

## 2016-05-28 ENCOUNTER — Encounter (HOSPITAL_COMMUNITY): Payer: Self-pay | Admitting: Cardiovascular Disease

## 2016-05-28 ENCOUNTER — Other Ambulatory Visit: Payer: Self-pay | Admitting: *Deleted

## 2016-05-28 DIAGNOSIS — E7800 Pure hypercholesterolemia, unspecified: Secondary | ICD-10-CM

## 2016-05-28 DIAGNOSIS — Z7982 Long term (current) use of aspirin: Secondary | ICD-10-CM | POA: Diagnosis not present

## 2016-05-28 DIAGNOSIS — E669 Obesity, unspecified: Secondary | ICD-10-CM | POA: Diagnosis present

## 2016-05-28 DIAGNOSIS — I214 Non-ST elevation (NSTEMI) myocardial infarction: Secondary | ICD-10-CM | POA: Diagnosis present

## 2016-05-28 DIAGNOSIS — E119 Type 2 diabetes mellitus without complications: Secondary | ICD-10-CM | POA: Diagnosis present

## 2016-05-28 DIAGNOSIS — I2510 Atherosclerotic heart disease of native coronary artery without angina pectoris: Secondary | ICD-10-CM | POA: Diagnosis not present

## 2016-05-28 DIAGNOSIS — Z880 Allergy status to penicillin: Secondary | ICD-10-CM | POA: Diagnosis not present

## 2016-05-28 DIAGNOSIS — I25110 Atherosclerotic heart disease of native coronary artery with unstable angina pectoris: Secondary | ICD-10-CM

## 2016-05-28 DIAGNOSIS — E8770 Fluid overload, unspecified: Secondary | ICD-10-CM | POA: Diagnosis not present

## 2016-05-28 DIAGNOSIS — I10 Essential (primary) hypertension: Secondary | ICD-10-CM | POA: Diagnosis not present

## 2016-05-28 DIAGNOSIS — K219 Gastro-esophageal reflux disease without esophagitis: Secondary | ICD-10-CM | POA: Diagnosis present

## 2016-05-28 DIAGNOSIS — Z01810 Encounter for preprocedural cardiovascular examination: Secondary | ICD-10-CM

## 2016-05-28 DIAGNOSIS — N2889 Other specified disorders of kidney and ureter: Secondary | ICD-10-CM | POA: Diagnosis present

## 2016-05-28 DIAGNOSIS — R001 Bradycardia, unspecified: Secondary | ICD-10-CM | POA: Diagnosis not present

## 2016-05-28 DIAGNOSIS — F17210 Nicotine dependence, cigarettes, uncomplicated: Secondary | ICD-10-CM | POA: Diagnosis present

## 2016-05-28 DIAGNOSIS — R079 Chest pain, unspecified: Secondary | ICD-10-CM | POA: Diagnosis present

## 2016-05-28 DIAGNOSIS — J9811 Atelectasis: Secondary | ICD-10-CM | POA: Diagnosis not present

## 2016-05-28 DIAGNOSIS — J449 Chronic obstructive pulmonary disease, unspecified: Secondary | ICD-10-CM | POA: Diagnosis present

## 2016-05-28 DIAGNOSIS — Z6831 Body mass index (BMI) 31.0-31.9, adult: Secondary | ICD-10-CM | POA: Diagnosis not present

## 2016-05-28 HISTORY — PX: CARDIAC CATHETERIZATION: SHX172

## 2016-05-28 LAB — PULMONARY FUNCTION TEST
DL/VA % pred: 98 %
DL/VA: 4.44 ml/min/mmHg/L
DLCO COR % PRED: 95 %
DLCO COR: 28.38 ml/min/mmHg
DLCO unc % pred: 97 %
DLCO unc: 28.85 ml/min/mmHg
FEF 25-75 POST: 3.71 L/s
FEF 25-75 Pre: 2.49 L/sec
FEF2575-%CHANGE-POST: 48 %
FEF2575-%PRED-POST: 120 %
FEF2575-%PRED-PRE: 81 %
FEV1-%CHANGE-POST: 12 %
FEV1-%Pred-Post: 93 %
FEV1-%Pred-Pre: 83 %
FEV1-POST: 3.3 L
FEV1-Pre: 2.94 L
FEV1FVC-%CHANGE-POST: 6 %
FEV1FVC-%PRED-PRE: 99 %
FEV6-%Change-Post: 3 %
FEV6-%Pred-Post: 89 %
FEV6-%Pred-Pre: 86 %
FEV6-Post: 3.96 L
FEV6-Pre: 3.83 L
FEV6FVC-%Change-Post: 0 %
FEV6FVC-%PRED-POST: 104 %
FEV6FVC-%Pred-Pre: 103 %
FVC-%Change-Post: 5 %
FVC-%PRED-POST: 87 %
FVC-%PRED-PRE: 83 %
FVC-POST: 4.05 L
FVC-PRE: 3.84 L
POST FEV1/FVC RATIO: 82 %
PRE FEV1/FVC RATIO: 77 %
Post FEV6/FVC ratio: 100 %
Pre FEV6/FVC Ratio: 100 %
RV % pred: 130 %
RV: 2.62 L
TLC % PRED: 104 %
TLC: 6.9 L

## 2016-05-28 LAB — BLOOD GAS, ARTERIAL
Acid-Base Excess: 1.6 mmol/L (ref 0.0–2.0)
Bicarbonate: 24.9 mmol/L (ref 20.0–28.0)
Drawn by: 46203
FIO2: 28
O2 SAT: 96.2 %
PATIENT TEMPERATURE: 98.6
PO2 ART: 80 mmHg — AB (ref 83.0–108.0)
pCO2 arterial: 34.3 mmHg (ref 32.0–48.0)
pH, Arterial: 7.474 — ABNORMAL HIGH (ref 7.350–7.450)

## 2016-05-28 LAB — URINALYSIS, ROUTINE W REFLEX MICROSCOPIC
Bilirubin Urine: NEGATIVE
GLUCOSE, UA: NEGATIVE mg/dL
Hgb urine dipstick: NEGATIVE
KETONES UR: NEGATIVE mg/dL
LEUKOCYTES UA: NEGATIVE
Nitrite: NEGATIVE
PROTEIN: NEGATIVE mg/dL
Specific Gravity, Urine: 1.008 (ref 1.005–1.030)
pH: 6 (ref 5.0–8.0)

## 2016-05-28 LAB — PROTIME-INR
INR: 0.98
PROTHROMBIN TIME: 13 s (ref 11.4–15.2)

## 2016-05-28 LAB — VAS US DOPPLER PRE CABG
LCCADDIAS: -27 cm/s
LCCADSYS: -117 cm/s
LEFT ECA DIAS: -20 cm/s
LEFT VERTEBRAL DIAS: 11 cm/s
LICADSYS: -93 cm/s
LICAPDIAS: -13 cm/s
Left CCA prox dias: 27 cm/s
Left CCA prox sys: 135 cm/s
Left ICA dist dias: -23 cm/s
Left ICA prox sys: -58 cm/s
RCCAPSYS: 88 cm/s
RIGHT ECA DIAS: -12 cm/s
RIGHT VERTEBRAL DIAS: -17 cm/s
Right CCA prox dias: 14 cm/s
Right cca dist sys: -73 cm/s

## 2016-05-28 LAB — BASIC METABOLIC PANEL
Anion gap: 9 (ref 5–15)
BUN: 6 mg/dL (ref 6–20)
CO2: 26 mmol/L (ref 22–32)
CREATININE: 0.89 mg/dL (ref 0.61–1.24)
Calcium: 8.7 mg/dL — ABNORMAL LOW (ref 8.9–10.3)
Chloride: 107 mmol/L (ref 101–111)
GFR calc Af Amer: >60 mL/min (ref >60)
GLUCOSE: 113 mg/dL — AB (ref 65–99)
POTASSIUM: 3.6 mmol/L (ref 3.5–5.1)
SODIUM: 142 mmol/L (ref 135–145)

## 2016-05-28 LAB — CBC
HCT: 45.4 % (ref 39.0–52.0)
Hemoglobin: 15.2 g/dL (ref 13.0–17.0)
MCH: 32.3 pg (ref 26.0–34.0)
MCHC: 33.5 g/dL (ref 30.0–36.0)
MCV: 96.6 fL (ref 78.0–100.0)
PLATELETS: 298 10*3/uL (ref 150–400)
RBC: 4.7 MIL/uL (ref 4.22–5.81)
RDW: 13.3 % (ref 11.5–15.5)
WBC: 7.8 10*3/uL (ref 4.0–10.5)

## 2016-05-28 LAB — TYPE AND SCREEN
ABO/RH(D): O NEG
Antibody Screen: NEGATIVE

## 2016-05-28 LAB — ABO/RH: ABO/RH(D): O NEG

## 2016-05-28 SURGERY — LEFT HEART CATH AND CORONARY ANGIOGRAPHY
Anesthesia: LOCAL

## 2016-05-28 MED ORDER — MAGNESIUM SULFATE 50 % IJ SOLN
40.0000 meq | INTRAMUSCULAR | Status: DC
  Filled 2016-05-28: qty 10

## 2016-05-28 MED ORDER — TEMAZEPAM 15 MG PO CAPS: 15.0000 mg | ORAL_CAPSULE | Freq: Once | ORAL | Status: DC | PRN

## 2016-05-28 MED ORDER — SODIUM CHLORIDE 0.9 % IV SOLN: INTRAVENOUS | Status: AC

## 2016-05-28 MED ORDER — CHLORHEXIDINE GLUCONATE CLOTH 2 % EX PADS: 6.0000 | MEDICATED_PAD | Freq: Once | CUTANEOUS | Status: DC

## 2016-05-28 MED ORDER — CHLORHEXIDINE GLUCONATE CLOTH 2 % EX PADS
6.0000 | MEDICATED_PAD | Freq: Once | CUTANEOUS | Status: AC
  Administered 2016-05-28: 6 via TOPICAL

## 2016-05-28 MED ORDER — SODIUM CHLORIDE 0.9% FLUSH: 3.0000 mL | INTRAVENOUS | Status: DC | PRN

## 2016-05-28 MED ORDER — EPINEPHRINE PF 1 MG/ML IJ SOLN
0.0000 ug/min | INTRAMUSCULAR | Status: DC
  Filled 2016-05-28: qty 4

## 2016-05-28 MED ORDER — LIDOCAINE HCL (PF) 1 % IJ SOLN
INTRAMUSCULAR | Status: AC
  Filled 2016-05-28: qty 30

## 2016-05-28 MED ORDER — DEXMEDETOMIDINE HCL IN NACL 400 MCG/100ML IV SOLN
0.1000 ug/kg/h | INTRAVENOUS | Status: AC
  Administered 2016-05-29: .3 ug/kg/h via INTRAVENOUS
  Filled 2016-05-28: qty 100

## 2016-05-28 MED ORDER — FENTANYL CITRATE (PF) 100 MCG/2ML IJ SOLN
INTRAMUSCULAR | Status: AC
  Filled 2016-05-28: qty 2

## 2016-05-28 MED ORDER — BISACODYL 5 MG PO TBEC
5.0000 mg | DELAYED_RELEASE_TABLET | Freq: Once | ORAL | Status: AC
  Administered 2016-05-28: 5 mg via ORAL
  Filled 2016-05-28: qty 1

## 2016-05-28 MED ORDER — IOPAMIDOL (ISOVUE-370) INJECTION 76%
INTRAVENOUS | Status: DC | PRN
  Administered 2016-05-28: 75 mL via INTRA_ARTERIAL

## 2016-05-28 MED ORDER — SODIUM CHLORIDE 0.9% FLUSH
3.0000 mL | Freq: Two times a day (BID) | INTRAVENOUS | Status: DC
  Administered 2016-05-28: 3 mL via INTRAVENOUS

## 2016-05-28 MED ORDER — ATORVASTATIN CALCIUM 80 MG PO TABS
80.0000 mg | ORAL_TABLET | Freq: Every day | ORAL | Status: DC
  Administered 2016-05-28: 80 mg via ORAL
  Filled 2016-05-28 (×2): qty 1

## 2016-05-28 MED ORDER — HEPARIN SODIUM (PORCINE) 1000 UNIT/ML IJ SOLN
INTRAMUSCULAR | Status: DC
Start: 2016-05-29 — End: 2016-05-29
  Filled 2016-05-28: qty 30

## 2016-05-28 MED ORDER — NITROGLYCERIN IN D5W 200-5 MCG/ML-% IV SOLN
2.0000 ug/min | INTRAVENOUS | Status: AC
  Administered 2016-05-29: 5 ug/min via INTRAVENOUS
  Filled 2016-05-28: qty 250

## 2016-05-28 MED ORDER — SODIUM CHLORIDE 0.9 % IV SOLN
INTRAVENOUS | Status: AC
  Administered 2016-05-29: 1.1 [IU]/h via INTRAVENOUS
  Filled 2016-05-28: qty 2.5

## 2016-05-28 MED ORDER — PHENYLEPHRINE HCL 10 MG/ML IJ SOLN
30.0000 ug/min | INTRAVENOUS | Status: AC
  Administered 2016-05-29: 20 ug/min via INTRAVENOUS
  Filled 2016-05-28: qty 2

## 2016-05-28 MED ORDER — DIAZEPAM 5 MG PO TABS
10.0000 mg | ORAL_TABLET | Freq: Once | ORAL | Status: AC
  Administered 2016-05-29: 10 mg via ORAL
  Filled 2016-05-28: qty 2

## 2016-05-28 MED ORDER — LIDOCAINE HCL (PF) 1 % IJ SOLN
INTRAMUSCULAR | Status: DC | PRN
  Administered 2016-05-28: 2 mL via INTRADERMAL

## 2016-05-28 MED ORDER — VERAPAMIL HCL 2.5 MG/ML IV SOLN
INTRAVENOUS | Status: AC
  Filled 2016-05-28: qty 2

## 2016-05-28 MED ORDER — MIDAZOLAM HCL 2 MG/2ML IJ SOLN
INTRAMUSCULAR | Status: DC | PRN
  Administered 2016-05-28: 1 mg via INTRAVENOUS

## 2016-05-28 MED ORDER — TRANEXAMIC ACID (OHS) BOLUS VIA INFUSION
15.0000 mg/kg | INTRAVENOUS | Status: AC
  Administered 2016-05-29: 1404 mg via INTRAVENOUS
  Filled 2016-05-28: qty 1404

## 2016-05-28 MED ORDER — HEPARIN (PORCINE) IN NACL 100-0.45 UNIT/ML-% IJ SOLN
1400.0000 [IU]/h | INTRAMUSCULAR | Status: DC
  Administered 2016-05-28: 1150 [IU]/h via INTRAVENOUS
  Filled 2016-05-28: qty 250

## 2016-05-28 MED ORDER — HYDROCODONE-ACETAMINOPHEN 5-325 MG PO TABS: 1.0000 | ORAL_TABLET | Freq: Four times a day (QID) | ORAL | 0 refills | Status: DC | PRN

## 2016-05-28 MED ORDER — METOPROLOL TARTRATE 12.5 MG HALF TABLET
12.5000 mg | ORAL_TABLET | Freq: Once | ORAL | Status: AC
  Administered 2016-05-29: 12.5 mg via ORAL
  Filled 2016-05-28: qty 1

## 2016-05-28 MED ORDER — POTASSIUM CHLORIDE 2 MEQ/ML IV SOLN
80.0000 meq | INTRAVENOUS | Status: DC
Start: 2016-05-29 — End: 2016-05-29
  Filled 2016-05-28: qty 40

## 2016-05-28 MED ORDER — ALPRAZOLAM 0.25 MG PO TABS
0.2500 mg | ORAL_TABLET | ORAL | Status: DC | PRN
Start: 2016-05-28 — End: 2016-05-29

## 2016-05-28 MED ORDER — HEPARIN (PORCINE) IN NACL 2-0.9 UNIT/ML-% IJ SOLN
INTRAMUSCULAR | Status: DC | PRN
  Administered 2016-05-28: 1000 mL

## 2016-05-28 MED ORDER — LEVOFLOXACIN IN D5W 500 MG/100ML IV SOLN
500.0000 mg | INTRAVENOUS | Status: AC
  Administered 2016-05-29: 500 mg via INTRAVENOUS
  Filled 2016-05-28: qty 100

## 2016-05-28 MED ORDER — HEPARIN SODIUM (PORCINE) 1000 UNIT/ML IJ SOLN
INTRAMUSCULAR | Status: AC
  Filled 2016-05-28: qty 1

## 2016-05-28 MED ORDER — TRANEXAMIC ACID 1000 MG/10ML IV SOLN
1.5000 mg/kg/h | INTRAVENOUS | Status: DC
  Administered 2016-05-29: 1.5 mg/kg/h via INTRAVENOUS
  Filled 2016-05-28: qty 25

## 2016-05-28 MED ORDER — FENTANYL CITRATE (PF) 100 MCG/2ML IJ SOLN
INTRAMUSCULAR | Status: DC | PRN
  Administered 2016-05-28: 25 ug via INTRAVENOUS

## 2016-05-28 MED ORDER — TRANEXAMIC ACID (OHS) PUMP PRIME SOLUTION
2.0000 mg/kg | INTRAVENOUS | Status: DC
  Filled 2016-05-28: qty 1.87

## 2016-05-28 MED ORDER — VANCOMYCIN HCL 10 G IV SOLR
1500.0000 mg | INTRAVENOUS | Status: AC
  Administered 2016-05-29: 1500 mg via INTRAVENOUS
  Filled 2016-05-28: qty 1500

## 2016-05-28 MED ORDER — MIDAZOLAM HCL 2 MG/2ML IJ SOLN
INTRAMUSCULAR | Status: AC
  Filled 2016-05-28: qty 2

## 2016-05-28 MED ORDER — DOPAMINE-DEXTROSE 3.2-5 MG/ML-% IV SOLN
0.0000 ug/kg/min | INTRAVENOUS | Status: DC
  Filled 2016-05-28: qty 250

## 2016-05-28 MED ORDER — HEPARIN SODIUM (PORCINE) 1000 UNIT/ML IJ SOLN
INTRAMUSCULAR | Status: DC | PRN
  Administered 2016-05-28: 5000 [IU] via INTRAVENOUS

## 2016-05-28 MED ORDER — PLASMA-LYTE 148 IV SOLN
INTRAVENOUS | Status: AC
  Administered 2016-05-29: 500 mL
  Filled 2016-05-28: qty 2.5

## 2016-05-28 MED ORDER — ALBUTEROL SULFATE (2.5 MG/3ML) 0.083% IN NEBU
2.5000 mg | INHALATION_SOLUTION | Freq: Once | RESPIRATORY_TRACT | Status: AC
Start: 2016-05-28 — End: 2016-05-28
  Administered 2016-05-28: 2.5 mg via RESPIRATORY_TRACT

## 2016-05-28 MED ORDER — SODIUM CHLORIDE 0.9 % IV SOLN: 250.0000 mL | INTRAVENOUS | Status: DC | PRN

## 2016-05-28 MED ORDER — VERAPAMIL HCL 2.5 MG/ML IV SOLN
INTRAVENOUS | Status: DC | PRN
  Administered 2016-05-28: 10 mL via INTRA_ARTERIAL

## 2016-05-28 MED ORDER — CHLORHEXIDINE GLUCONATE 0.12 % MT SOLN
15.0000 mL | Freq: Once | OROMUCOSAL | Status: AC
  Administered 2016-05-29: 15 mL via OROMUCOSAL

## 2016-05-28 SURGICAL SUPPLY — 12 items
CATH EXPO 5F FL3.5 (CATHETERS) ×2 IMPLANT
CATH INFINITI 5FR ANG PIGTAIL (CATHETERS) ×2 IMPLANT
CATH INFINITI JR4 5F (CATHETERS) ×2 IMPLANT
DEVICE RAD COMP TR BAND LRG (VASCULAR PRODUCTS) ×2 IMPLANT
GLIDESHEATH SLEND SS 6F .021 (SHEATH) ×2 IMPLANT
GUIDEWIRE INQWIRE 1.5J.035X260 (WIRE) ×1 IMPLANT
INQWIRE 1.5J .035X260CM (WIRE) ×2
KIT HEART LEFT (KITS) ×2 IMPLANT
PACK CARDIAC CATHETERIZATION (CUSTOM PROCEDURE TRAY) ×2 IMPLANT
SYR MEDRAD MARK V 150ML (SYRINGE) ×2 IMPLANT
TRANSDUCER W/STOPCOCK (MISCELLANEOUS) ×2 IMPLANT
TUBING CIL FLEX 10 FLL-RA (TUBING) ×2 IMPLANT

## 2016-05-28 NOTE — Progress Notes (Signed)
1511 Came at 1420 to see pt to do pre op ed. Pt gone for tests. Left [facility] booklet and care guide for pt and family. Discussed that pt will need 24/7 care first week home after surgery. Put on pre op video as they wanted to view. Returned at 1511 and pt still gone. Will continue to follow. Luetta NuttingCharlene Deah Ottaway RN BSN 05/28/2016 3:13 PM

## 2016-05-28 NOTE — Progress Notes (Signed)
Patient Name: Dennis Winters Date of Encounter: 05/28/2016  [facility] Problem List     Principal Problem:   Chest pain Active Problems:   High cholesterol   Essential hypertension   NSTEMI (non-ST elevated myocardial infarction) (HCC)     Subjective   No angina now, but had some chest discomfort earlier when he was surprised that his cath would be done earlier than expected.  Inpatient Medications    Scheduled Meds: . aspirin EC  81 mg Oral Daily  . ketorolac  30 mg Intravenous Once  . lisinopril  10 mg Oral Daily  . simvastatin  40 mg Oral q1800  . sodium chloride flush  3 mL Intravenous Q12H   Continuous Infusions: . sodium chloride     PRN Meds: sodium chloride, acetaminophen, albuterol, morphine injection, ondansetron (ZOFRAN) IV, sodium chloride flush   Vital Signs    Vitals:   05/28/16 0810 05/28/16 0815 05/28/16 0836 05/28/16 0843  BP: 97/67 105/75 111/66 111/66  Pulse: 66 60 64   Resp: 16 15 18    Temp:      TempSrc:      SpO2: 90% 96% 97%   Weight:      Height:        Intake/Output Summary (Last 24 hours) at 05/28/16 1116 Last data filed at 05/28/16 0700  Gross per 24 hour  Intake              480 ml  Output                0 ml  Net              480 ml   Filed Weights   05/26/16 1901 05/27/16 0510 05/28/16 0544  Weight: 205 lb (93 kg) 211 lb 4.8 oz (95.8 kg) 206 lb 6.4 oz (93.6 kg)    Physical Exam    GEN: Well nourished, well developed, in no acute distress.  HEENT: Grossly normal.  Neck: Supple, no JVD, carotid bruits, or masses. Cardiac: RRR, no murmurs, rubs, or gallops. No clubbing, cyanosis, edema.  Radials/DP/PT 2+ and equal bilaterally.  Respiratory:  Respirations regular and unlabored, clear to auscultation bilaterally. GI: Soft, nontender, nondistended, BS + x 4. MS: no deformity or atrophy. Skin: warm and dry, no rash. Neuro:  Strength and sensation are intact. Psych: AAOx3.  Normal  affect.  Labs    CBC  Recent Labs  05/26/16 2120 05/28/16 0226  WBC 7.6 7.8  HGB 15.3 15.2  HCT 45.6 45.4  MCV 96.6 96.6  PLT 291 298   Basic Metabolic Panel  Recent Labs  05/26/16 2120 05/28/16 0226  NA 140 142  K 3.6 3.6  CL 107 107  CO2 25 26  GLUCOSE 114* 113*  BUN 7 6  CREATININE 0.81 0.89  CALCIUM 8.8* 8.7*   Liver Function Tests  Recent Labs  05/26/16 2226  AST 18  ALT 17  ALKPHOS 76  BILITOT 0.3  PROT 6.7  ALBUMIN 3.6    Recent Labs  05/26/16 2226  LIPASE 36   Cardiac Enzymes  Recent Labs  05/26/16 2226 05/27/16 0116 05/27/16 0529 05/27/16 0802 05/27/16 1202  CKTOTAL 117 99  --   --   --   CKMB  --  2.4  --   --   --   TROPONINI  --  0.22* 0.26* 0.27* 0.25*    Telemetry    NSR - Personally Reviewed  ECG  NSR, normal tracing this AM - Personally Reviewed  Radiology    Dg Chest 2 View  Result Date: 05/26/2016 CLINICAL DATA:  Mid chest pain started 2 days ago with left arm numbness. EXAM: CHEST  2 VIEW COMPARISON:  None. FINDINGS: Low volume film. Interstitial markings are diffusely coarsened with chronic features. 1-2 cm nodular density projects at the left lung base. Question pleural thickening left upper hemi thorax. The cardiopericardial silhouette is within normal limits for size. The visualized bony structures of the thorax are intact. IMPRESSION: 1-2 cm nodular density left lung base. CT chest without contrast recommended to further evaluate. Low volume film with chronic interstitial coarsening. Electronically Signed   By: Kennith Center M.D.   On: 05/26/2016 20:00   Ct Angio Chest Pe W And/or Wo Contrast  Result Date: 05/27/2016 CLINICAL DATA:  Worsening left-sided chest pain, onset at 18:00. EXAM: CT ANGIOGRAPHY CHEST WITH CONTRAST TECHNIQUE: Multidetector CT imaging of the chest was performed using the standard protocol during bolus administration of intravenous contrast. Multiplanar CT image reconstructions and MIPs were  obtained to evaluate the vascular anatomy. CONTRAST:  100 mL Isovue 370 intravenous COMPARISON:  Radiographs 05/26/2016 FINDINGS: Cardiovascular: Satisfactory opacification of the pulmonary arteries to the segmental level. No evidence of pulmonary embolism. Normal heart size. No pericardial effusion. Mediastinum/Nodes: No enlarged mediastinal, hilar, or axillary lymph nodes. Thyroid gland, trachea, and esophagus demonstrate no significant findings. Lungs/Pleura: Lungs are clear. No pleural effusion or pneumothorax. Upper Abdomen: No acute abnormality. There is a 2 cm mass at the upper pole the right kidney which is only marginally imaged at the bottom of this study, but it probably measures soft tissue density. This requires additional evaluation to exclude a significant renal neoplasm. Musculoskeletal: No chest wall abnormality. No acute or significant osseous findings. Review of the MIP images confirms the above findings. IMPRESSION: 1. **An incidental finding of potential clinical significance has been found. There is a 2 cm upper pole right renal mass which requires additional evaluation to exclude a significant neoplasm. Abdominal MRI or CT without and with contrast is recommended. This can be performed on a nonemergent basis.** 2. Negative for pulmonary embolism or other acute abnormality in the chest. 3. These results will be called to the ordering clinician or representative by the Radiologist Assistant, and communication documented in the PACS or zVision Dashboard. Electronically Signed   By: Ellery Plunk M.D.   On: 05/27/2016 00:16    Cardiac Studies   Cath 05/28/16 [place]              Left Heart   Left Ventricle The left ventricular size is normal. The left ventricular systolic function is normal. LV end diastolic pressure is moderately elevated. The left ventricular ejection fraction is 45-50% by visual estimate. There are LV function abnormalities. There is no evidence of mitral  regurgitation.    Coronary Diagrams   Diagnostic Diagram        Patient Profile     55 yo with months of "reflux" that occurs on exertion, presents with unstable anguina and has 3-vessel CAD with mildly decreased LVEF.  Assessment & Plan    Surgical consultation has been requested for CABG. Pre CABG Doppler and PFTs ordered. Smoking cessation. Statin for target LDL<70. Beta blockers not prescribed  Hold off ACEi/ARB dose changes until after CABG. I think he can go home over the weekend, but avoid strenuous physical activity until CABG, if scheduled for next week. Workup for the 2 cm right  renal mass will have to wait until after CABG.  Signed, Thurmon FairMihai Chantrice Hagg, MD  05/28/2016, 11:16 AM

## 2016-05-28 NOTE — Progress Notes (Addendum)
Pre-op Cardiac Surgery  Carotid Findings:  Findings suggest 1-39% internal carotid artery stenosis bilaterally. Vertebral arteries are patent with antegrade flow.  Incidental finding: there are multiple heterogenous areas of the right and left lateral neck, largest measuring 2.0cm; suggestive of possible prominent lymph nodes.  Upper Extremity Right Left  Brachial Pressures 119-Triphasic 107-Triphasic  Radial Waveforms Triphasic Triphasic  Ulnar Waveforms Triphasic Triphasic  Palmar Arch (Allen's Test) Signal is unaffected with radial compression, obliterates with ulnar compression. Signal is unaffected with radial compression, obliterates with ulnar compression.     Lower  Extremity Right Left  Dorsalis Pedis 126- Triphasic 150-Triphasic  Anterior Tibial    Posterior Tibial 134-Triphasic 140-Triphasic  Ankle/Brachial Indices 1.13 1.26    Findings:   Bilateral ABIs are within normal limits at rest.  05/28/2016 2:35 PM Gertie FeyMichelle Meliss Fleek, BS, RVT, RDCS, RDMS

## 2016-05-28 NOTE — H&P (View-Only) (Signed)
CARDIOLOGY CONSULT NOTE   Patient ID: Dennis Winters MRN: [id number] DOB/AGE: 09-16-1960 55 y.o.  Admit date: 05/26/2016  Primary Physician   Erasmo DownerStrader, Lindsey F, NP Primary Cardiologist   New Reason for Consultation   Chest pain and elevated troponin Requesting MD: Dr [name]  WUJ:[id number]:Dennis Winters is a 55 y.o. year old male with a history of HTN, HLD, tob use. He quit taking Zocor about a week ago because of GERD sx and water brash. He also got arm numbness with certain positions, other positions would make it stop, but he is having a hard time finding them.  Pt has not had a history of chest pain. He has been able to climb a flight of stairs without difficulty. He was able to carry 2 cases of bottled water without a problem last week.   He has had a cold last 8-10 days, it has been gradually improving. Sputum was green till today, he had some bloody sputum this am.  Monday, for the first time, he had chest pain with exertion. He was having DOE before then, but no pain. He got 2 cases of water out of the truck and carried it, but did not get 10 feet. His chest began hurting, a throbbing pain. It went from the center of his chest to his L shoulder and arm. 10/10 at first. Pt went and laid down, the pain would decrease and he was able to sleep. By the next morning, it was gone. Tried Ibuprofen, not sure it helped.   Tuesday, he went to work, his chest/shoulder and arm started hurting about 10 am. He tried sitting in the office as much as possible, made it through the day. After he went home, the pain got worse>>ER. He got morphine in the ER and slept, the pain went away.   When he lays on his L side, he will get L arm pain, and chest pain. Not as severe as the original pain, but similar.   Past Medical History:  Diagnosis Date  . High cholesterol   . Hypertension      Past Surgical History:  Procedure Laterality Date  . ANKLE SURGERY    . CHOLECYSTECTOMY N/A 04/13/2013   Procedure: LAPAROSCOPIC CHOLECYSTECTOMY , attempted IOC.;  Surgeon: Ernestene MentionHaywood M Ingram, MD;  Location: [facility];  Service: General;  Laterality: N/A;    Allergies  Allergen Reactions  . Penicillins     Has patient had a PCN reaction causing immediate rash, facial/tongue/throat swelling, SOB or lightheadedness with hypotension: Yes Has patient had a PCN reaction causing severe rash involving mucus membranes or skin necrosis: Yes Has patient had a PCN reaction that required hospitalization No Has patient had a PCN reaction occurring within the last 10 years: Non  If all of the above answers are "NO", then may proceed with Cephalosporin use.     I have reviewed the patient's current medications . aspirin EC  81 mg Oral Daily  . ketorolac  30 mg Intravenous Once  . lisinopril  10 mg Oral Daily  . simvastatin  40 mg Oral q1800    acetaminophen, albuterol, morphine injection, ondansetron (ZOFRAN) IV  Prior to Admission medications   Medication Sig Start Date End Date Taking? Authorizing Provider  aspirin EC 81 MG tablet Take 81 mg by mouth daily.   Yes Historical Provider, MD  ibuprofen (ADVIL,MOTRIN) 200 MG tablet Take 800 mg by mouth daily as needed for mild pain or moderate pain.    Yes  Historical Provider, MD  lisinopril (PRINIVIL,ZESTRIL) 10 MG tablet Take 10 mg by mouth daily.   Yes Historical Provider, MD  Multiple Vitamin (MULTIVITAMIN WITH MINERALS) TABS tablet Take 1 tablet by mouth daily.   Yes Historical Provider, MD  niacin 500 MG tablet Take 500 mg by mouth daily.   Yes Historical Provider, MD  simvastatin (ZOCOR) 40 MG tablet Take 40 mg by mouth daily.   Yes Historical Provider, MD     Social History   Social History  . Marital status: Single    Spouse name: N/A  . Number of children: N/A  . Years of education: N/A   Occupational History  . 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 Smoking status: Current Every Day  Smoker    Packs/day: 0.50  . Smokeless tobacco: Never Used  . Alcohol use 3.6 oz/week    6 Cans of beer per week  . Drug use: No  . Sexual activity: Not on file   Other Topics Concern  . Not on file   Social History Narrative   Pt lives with son.     Family Status  Relation Status  . Mother Alive  . Father Alive  . Brother Alive  . Maternal Aunt Deceased  . Neg Hx    Family History  Problem Relation Age of Onset  . Pulmonary disease Maternal Aunt   . CAD Neg Hx      ROS:  Full 14 point review of systems complete and found to be negative unless listed above.  Physical Exam: Blood pressure (!) 100/59, pulse 69, temperature 98.1 F (36.7 C), temperature source Oral, resp. rate 14, height 5\' 8"  (1.727 m), weight 211 lb 4.8 oz (95.8 kg), SpO2 95 %.  General: Well developed, well nourished, male in no acute distress Head: Eyes PERRLA, No xanthomas.   Normocephalic and atraumatic, oropharynx without edema or exudate. Dentition: good Lungs: Scattered rales, good air exchange  Heart: HRRR S1 S2, no rub/gallop, no murmur. pulses are 2+ all 4 extrem.   Neck: No carotid bruits. No lymphadenopathy.  JVD not elevated Abdomen: Bowel sounds present, abdomen soft and non-tender without masses or hernias noted. Msk:  No spine or cva tenderness. No weakness, no joint deformities or effusions. There is an area of tenderness in his upper L chest, very close to the L shoulder. Extremities: No clubbing or cyanosis. No edema.  Neuro: Alert and oriented X 3. No focal deficits noted. Psych:  Good affect, responds appropriately Skin: No rashes or lesions noted.  Labs:   Lab Results  Component Value Date   WBC 7.6 05/26/2016   HGB 15.3 05/26/2016   HCT 45.6 05/26/2016   MCV 96.6 05/26/2016   PLT 291 05/26/2016     Recent Labs Lab 05/26/16 2120 05/26/16 2226  NA 140  --   K 3.6  --   CL 107  --   CO2 25  --   BUN 7  --   CREATININE 0.81  --   CALCIUM 8.8*  --   PROT  --  6.7    BILITOT  --  0.3  ALKPHOS  --  76  ALT  --  17  AST  --  18  GLUCOSE 114*  --   ALBUMIN  --  3.6   No results found for: MG  Recent Labs  05/26/16 2226 05/27/16 0116 05/27/16 0529 05/27/16 0802 05/27/16 1202  CKTOTAL 117 99  --   --   --   CKMB  --  2.4  --   --   --   TROPONINI  --  0.22* 0.26* 0.27* 0.25*   Lipase  Date/Time Value Ref Range Status  05/26/2016 10:26 PM 36 11 - 51 U/L Final   Echo: n/a  ECG:  12/05 SR, no acute changes  Cath: n/a  Radiology:  Dg Chest 2 View Result Date: 05/26/2016 CLINICAL DATA:  Mid chest pain started 2 days ago with left arm numbness. EXAM: CHEST  2 VIEW COMPARISON:  None. FINDINGS: Low volume film. Interstitial markings are diffusely coarsened with chronic features. 1-2 cm nodular density projects at the left lung base. Question pleural thickening left upper hemi thorax. The cardiopericardial silhouette is within normal limits for size. The visualized bony structures of the thorax are intact. IMPRESSION: 1-2 cm nodular density left lung base. CT chest without contrast recommended to further evaluate. Low volume film with chronic interstitial coarsening. Electronically Signed   By: Kennith Center M.D.   On: 05/26/2016 20:00   Ct Angio Chest Pe W And/or Wo Contrast Result Date: 05/27/2016 CLINICAL DATA:  Worsening left-sided chest pain, onset at 18:00. EXAM: CT ANGIOGRAPHY CHEST WITH CONTRAST TECHNIQUE: Multidetector CT imaging of the chest was performed using the standard protocol during bolus administration of intravenous contrast. Multiplanar CT image reconstructions and MIPs were obtained to evaluate the vascular anatomy. CONTRAST:  100 mL Isovue 370 intravenous COMPARISON:  Radiographs 05/26/2016 FINDINGS: Cardiovascular: Satisfactory opacification of the pulmonary arteries to the segmental level. No evidence of pulmonary embolism. Normal heart size. No pericardial effusion. Mediastinum/Nodes: No enlarged mediastinal, hilar, or axillary  lymph nodes. Thyroid gland, trachea, and esophagus demonstrate no significant findings. Lungs/Pleura: Lungs are clear. No pleural effusion or pneumothorax. Upper Abdomen: No acute abnormality. There is a 2 cm mass at the upper pole the right kidney which is only marginally imaged at the bottom of this study, but it probably measures soft tissue density. This requires additional evaluation to exclude a significant renal neoplasm. Musculoskeletal: No chest wall abnormality. No acute or significant osseous findings. Review of the MIP images confirms the above findings. IMPRESSION: 1. **An incidental finding of potential clinical significance has been found. There is a 2 cm upper pole right renal mass which requires additional evaluation to exclude a significant neoplasm. Abdominal MRI or CT without and with contrast is recommended. This can be performed on a nonemergent basis.** 2. Negative for pulmonary embolism or other acute abnormality in the chest. 3. These results will be called to the ordering clinician or representative by the Radiologist Assistant, and communication documented in the PACS or zVision Dashboard. Electronically Signed   By: Ellery Plunk M.D.   On: 05/27/2016 00:16    ASSESSMENT AND PLAN:   The patient was seen today by Dr Royann Shivers, the patient evaluated and the data reviewed.   Principal Problem: 1.  Chest pain - ECG not acute - Pain reproducible w/ palpation - Enzymes are mildly elevated - With abnl ez, probably needs cath.  Active Problems: 2.  High cholesterol - would f/u with primary MD - try another statin  3.  Essential hypertension - BP control borderline in hospital - some resting bradycardia, so no BB - MD advise on increasing lisinopril  Signed: Leanna Battles 05/27/2016 4:40 PM Beeper [phone]  Co-Sign MD  I have seen and examined the patient along with Barrett, Bjorn Loser, PA-C.  I have reviewed the chart,  notes and new data.  I agree with PA's  note.  Key new complaints: symptoms are worrisome for unstable angina, appears more likely than a musculoskeletal cause by his description Key examination changes: normal exam Key new findings / data: mild "plateau" elevation in troponin, subtle ST depression V4-V5 on arrival to ED has resolved, age-advanced calcification in distribution of LAD on CT scan. Incidentally noted 2 cm round mass in upper pole of right kidney will require further evaluation  PLAN: Coronary angio in AM for unstable angina with high risk ECG and biochemical markers. This procedure has been fully reviewed with the patient and written informed consent has been obtained. Start IV heparin with abnormal enzymes. Cannot increase beta blocker with low heart rate, hold off increasing ACEi due to upcoming cath.  Thurmon FairMihai Kasson Lamere, MD, [facility] [phone] 05/27/2016, 6:28 PM

## 2016-05-28 NOTE — Progress Notes (Signed)
Pt stable during shift; right radial remains level 0 with clean, dry and intact sterile dsg. No bruise, stain or active bleeding noted. Pt resting comfortably in bed with family at bedside. Call light within reach and reported off to oncoming RN. Dionne BucyP. Amo Geanette Buonocore RN

## 2016-05-28 NOTE — Interval H&P Note (Signed)
History and Physical Interval Note:  05/28/2016 7:38 AM  Dennis Winters  has presented today for cardiac cath with the diagnosis of nstemi  The various methods of treatment have been discussed with the patient and family. After consideration of risks, benefits and other options for treatment, the patient has consented to  Procedure(s): Left Heart Cath and Coronary Angiography (N/A) as a surgical intervention .  The patient's history has been reviewed, patient examined, no change in status, stable for surgery.  I have reviewed the patient's chart and labs.  Questions were answered to the patient's satisfaction.    Cath Lab Visit (complete for each Cath Lab visit)  Clinical Evaluation Leading to the Procedure:   ACS: Yes.    Non-ACS:    Anginal Classification: CCS III  Anti-ischemic medical therapy: No Therapy  Non-Invasive Test Results: No non-invasive testing performed  Prior CABG: No previous CABG         Verne Carrowhristopher Nakshatra Klose

## 2016-05-28 NOTE — Consult Note (Signed)
[address]       [place] [address]             [phone]      Cardiothoracic Surgery Consultation  Reason for Consult: Severe multi-vessel coronary artery disease with NSTEMI Referring Physician: Dr. Sallyanne Kuster.   HPI :  The patient is a 55 year old smoker with hypertension and hyperlipidemia who reports a several month history or what he thought was heartburn as well as some shortness of breath with exertion. On Monday he started having chest pain radiation to his left shoulder and arm. This occurred while lifting two cases of water out of his truck. He thought he pulled a muscle. The pain improved after lying down and he went to bed. The next morning the pain was resolved but when he went to work on Tuesday it recurred. 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 When he got home the pain got worse and he went to [facility] where he received morphine with resolution of his pain. His ECG was non-acute and troponin 0.26, 0.27. He was transferred to [facility] and underwent cath today showing severe 3-vessel CAD with a large dominant RCA with a focal 99% mid stenosis. The LCX has a large OM that is occluded proximally and fills by collat. The LAD has a 80% stenosis in the mid portion. LV function is preserved with inferior hypokinesis.   Past Medical History:  Diagnosis Date  . High cholesterol   . Hypertension     Past Surgical History:  Procedure Laterality Date  . ANKLE SURGERY    . CARDIAC CATHETERIZATION N/A 05/28/2016   Procedure: Left Heart Cath and Coronary Angiography;  Surgeon: Burnell Blanks, MD;  Location: [facility];  Service: Cardiovascular;  Laterality: N/A;  . CHOLECYSTECTOMY N/A 04/13/2013   Procedure: LAPAROSCOPIC CHOLECYSTECTOMY , attempted IOC.;  Surgeon: Adin Hector, MD;  Location: [facility];  Service: General;  Laterality: N/A;    Family History  Problem Relation Age of Onset  .  Pulmonary disease Maternal Aunt   . CAD Neg Hx     Social History:  reports that he has been smoking.  He has been smoking about 0.50 packs per day. He has never used smokeless tobacco. He reports that he drinks about 3.6 oz of alcohol per week . He reports that he does not use drugs.  Lives with his wife who is here today.  Allergies:  Allergies  Allergen Reactions  . Penicillins     Has patient had a PCN reaction causing immediate rash, facial/tongue/throat swelling, SOB or lightheadedness with hypotension: Yes Has patient had a PCN reaction causing severe rash involving mucus membranes or skin necrosis: Yes Has patient had a PCN reaction that required hospitalization No Has patient had a PCN reaction occurring within the last 10 years: Non  If all of the above answers are "NO", then may proceed with Cephalosporin use.     Medications:  I have reviewed the patient's current medications. Prior to Admission:  Prescriptions Prior to Admission  Medication Sig Dispense Refill Last Dose  . aspirin EC 81 MG tablet Take 81 mg by mouth daily.   05/25/2016 at Unknown time  . ibuprofen (ADVIL,MOTRIN) 200 MG tablet Take 800 mg by mouth daily as needed for mild pain or moderate pain.    05/26/2016 at Unknown time  . lisinopril (PRINIVIL,ZESTRIL) 10 MG tablet Take 10  mg by mouth daily.   05/25/2016 at Unknown time  . Multiple Vitamin (MULTIVITAMIN WITH MINERALS) TABS tablet Take 1 tablet by mouth daily.   05/25/2016 at Unknown time  . niacin 500 MG tablet Take 500 mg by mouth daily.   05/25/2016 at Unknown time  . simvastatin (ZOCOR) 40 MG tablet Take 40 mg by mouth daily.   Past Month at Unknown time   Scheduled: . aspirin EC  81 mg Oral Daily  . atorvastatin  80 mg Oral q1800  . ketorolac  30 mg Intravenous Once  . lisinopril  10 mg Oral Daily  . sodium chloride flush  3 mL Intravenous Q12H   Continuous: . heparin     YKD:XIPJAS chloride, acetaminophen, albuterol, morphine injection,  ondansetron (ZOFRAN) IV, sodium chloride flush Anti-infectives    None      Results for orders placed or performed during the hospital encounter of 05/26/16 (from the past 48 hour(s))  Basic metabolic panel     Status: Abnormal   Collection Time: 05/26/16  9:20 PM  Result Value Ref Range   Sodium 140 135 - 145 mmol/L   Potassium 3.6 3.5 - 5.1 mmol/L   Chloride 107 101 - 111 mmol/L   CO2 25 22 - 32 mmol/L   Glucose, Bld 114 (H) 65 - 99 mg/dL   BUN 7 6 - 20 mg/dL   Creatinine, Ser 0.81 0.61 - 1.24 mg/dL   Calcium 8.8 (L) 8.9 - 10.3 mg/dL   GFR calc non Af Amer >60 >60 mL/min   GFR calc Af Amer >60 >60 mL/min    Comment: (NOTE) The eGFR has been calculated using the CKD EPI equation. This calculation has not been validated in all clinical situations. eGFR's persistently <60 mL/min signify possible Chronic Kidney Disease.    Anion gap 8 5 - 15  CBC     Status: None   Collection Time: 05/26/16  9:20 PM  Result Value Ref Range   WBC 7.6 4.0 - 10.5 K/uL   RBC 4.72 4.22 - 5.81 MIL/uL   Hemoglobin 15.3 13.0 - 17.0 g/dL   HCT 45.6 39.0 - 52.0 %   MCV 96.6 78.0 - 100.0 fL   MCH 32.4 26.0 - 34.0 pg   MCHC 33.6 30.0 - 36.0 g/dL   RDW 13.0 11.5 - 15.5 %   Platelets 291 150 - 400 K/uL  Troponin I     Status: Abnormal   Collection Time: 05/26/16  9:20 PM  Result Value Ref Range   Troponin I 0.22 (HH) <0.03 ng/mL    Comment: CRITICAL RESULT CALLED TO, READ BACK BY AND VERIFIED WITH: Daisy Lazar AT 2207 ON [id number] BY FORSYTH K   Troponin I     Status: Abnormal   Collection Time: 05/26/16 10:19 PM  Result Value Ref Range   Troponin I 0.23 (HH) <0.03 ng/mL    Comment: CRITICAL VALUE NOTED.  VALUE IS CONSISTENT WITH PREVIOUSLY REPORTED AND CALLED VALUE.  Hepatic function panel     Status: Abnormal   Collection Time: 05/26/16 10:26 PM  Result Value Ref Range   Total Protein 6.7 6.5 - 8.1 g/dL   Albumin 3.6 3.5 - 5.0 g/dL   AST 18 15 - 41 U/L   ALT 17 17 - 63 U/L   Alkaline  Phosphatase 76 38 - 126 U/L   Total Bilirubin 0.3 0.3 - 1.2 mg/dL   Bilirubin, Direct <0.1 (L) 0.1 - 0.5 mg/dL   Indirect Bilirubin NOT CALCULATED 0.3 -  0.9 mg/dL  CK     Status: None   Collection Time: 05/26/16 10:26 PM  Result Value Ref Range   Total CK 117 49 - 397 U/L  Lipase, blood     Status: None   Collection Time: 05/26/16 10:26 PM  Result Value Ref Range   Lipase 36 11 - 51 U/L  Troponin I     Status: Abnormal   Collection Time: 05/27/16  1:16 AM  Result Value Ref Range   Troponin I 0.22 (HH) <0.03 ng/mL    Comment: CRITICAL VALUE NOTED.  VALUE IS CONSISTENT WITH PREVIOUSLY REPORTED AND CALLED VALUE.  CK total and CKMB (cardiac)not at [facility]     Status: None   Collection Time: 05/27/16  1:16 AM  Result Value Ref Range   Total CK 99 49 - 397 U/L   CK, MB 2.4 0.5 - 5.0 ng/mL   Relative Index RELATIVE INDEX IS INVALID 0.0 - 2.5    Comment: WHEN CK < 100 U/L        Performed at [facility]   Troponin I-serum (0, 3, 6 hours)     Status: Abnormal   Collection Time: 05/27/16  5:29 AM  Result Value Ref Range   Troponin I 0.26 (HH) <0.03 ng/mL    Comment: CRITICAL RESULT CALLED TO, READ BACK BY AND VERIFIED WITH: GARDNER,D RN 05/27/2016 2229 JORDANS   Troponin I-serum (0, 3, 6 hours)     Status: Abnormal   Collection Time: 05/27/16  8:02 AM  Result Value Ref Range   Troponin I 0.27 (HH) <0.03 ng/mL    Comment: CRITICAL VALUE NOTED.  VALUE IS CONSISTENT WITH PREVIOUSLY REPORTED AND CALLED VALUE.  Troponin I-serum (0, 3, 6 hours)     Status: Abnormal   Collection Time: 05/27/16 12:02 PM  Result Value Ref Range   Troponin I 0.25 (HH) <0.03 ng/mL    Comment: CRITICAL VALUE NOTED.  VALUE IS CONSISTENT WITH PREVIOUSLY REPORTED AND CALLED VALUE.  CBC     Status: None   Collection Time: 05/28/16  2:26 AM  Result Value Ref Range   WBC 7.8 4.0 - 10.5 K/uL   RBC 4.70 4.22 - 5.81 MIL/uL   Hemoglobin 15.2 13.0 - 17.0 g/dL   HCT 45.4 39.0 - 52.0 %   MCV 96.6 78.0 - 100.0 fL    MCH 32.3 26.0 - 34.0 pg   MCHC 33.5 30.0 - 36.0 g/dL   RDW 13.3 11.5 - 15.5 %   Platelets 298 150 - 400 K/uL  Basic metabolic panel     Status: Abnormal   Collection Time: 05/28/16  2:26 AM  Result Value Ref Range   Sodium 142 135 - 145 mmol/L   Potassium 3.6 3.5 - 5.1 mmol/L   Chloride 107 101 - 111 mmol/L   CO2 26 22 - 32 mmol/L   Glucose, Bld 113 (H) 65 - 99 mg/dL   BUN 6 6 - 20 mg/dL   Creatinine, Ser 0.89 0.61 - 1.24 mg/dL   Calcium 8.7 (L) 8.9 - 10.3 mg/dL   GFR calc non Af Amer >60 >60 mL/min   GFR calc Af Amer >60 >60 mL/min    Comment: (NOTE) The eGFR has been calculated using the CKD EPI equation. This calculation has not been validated in all clinical situations. eGFR's persistently <60 mL/min signify possible Chronic Kidney Disease.    Anion gap 9 5 - 15  Protime-INR     Status: None   Collection Time: 05/28/16  5:21 AM  Result Value Ref Range   Prothrombin Time 13.0 11.4 - 15.2 seconds   INR 0.98     Dg Chest 2 View  Result Date: 05/26/2016 CLINICAL DATA:  Mid chest pain started 2 days ago with left arm numbness. EXAM: CHEST  2 VIEW COMPARISON:  None. FINDINGS: Low volume film. Interstitial markings are diffusely coarsened with chronic features. 1-2 cm nodular density projects at the left lung base. Question pleural thickening left upper hemi thorax. The cardiopericardial silhouette is within normal limits for size. The visualized bony structures of the thorax are intact. IMPRESSION: 1-2 cm nodular density left lung base. CT chest without contrast recommended to further evaluate. Low volume film with chronic interstitial coarsening. Electronically Signed   By: Misty Stanley M.D.   On: 05/26/2016 20:00   Ct Angio Chest Pe W And/or Wo Contrast  Result Date: 05/27/2016 CLINICAL DATA:  Worsening left-sided chest pain, onset at 18:00. EXAM: CT ANGIOGRAPHY CHEST WITH CONTRAST TECHNIQUE: Multidetector CT imaging of the chest was performed using the standard protocol  during bolus administration of intravenous contrast. Multiplanar CT image reconstructions and MIPs were obtained to evaluate the vascular anatomy. CONTRAST:  100 mL Isovue 370 intravenous COMPARISON:  Radiographs 05/26/2016 FINDINGS: Cardiovascular: Satisfactory opacification of the pulmonary arteries to the segmental level. No evidence of pulmonary embolism. Normal heart size. No pericardial effusion. Mediastinum/Nodes: No enlarged mediastinal, hilar, or axillary lymph nodes. Thyroid gland, trachea, and esophagus demonstrate no significant findings. Lungs/Pleura: Lungs are clear. No pleural effusion or pneumothorax. Upper Abdomen: No acute abnormality. There is a 2 cm mass at the upper pole the right kidney which is only marginally imaged at the bottom of this study, but it probably measures soft tissue density. This requires additional evaluation to exclude a significant renal neoplasm. Musculoskeletal: No chest wall abnormality. No acute or significant osseous findings. Review of the MIP images confirms the above findings. IMPRESSION: 1. **An incidental finding of potential clinical significance has been found. There is a 2 cm upper pole right renal mass which requires additional evaluation to exclude a significant neoplasm. Abdominal MRI or CT without and with contrast is recommended. This can be performed on a nonemergent basis.** 2. Negative for pulmonary embolism or other acute abnormality in the chest. 3. These results will be called to the ordering clinician or representative by the Radiologist Assistant, and communication documented in the PACS or zVision Dashboard. Electronically Signed   By: Andreas Newport M.D.   On: 05/27/2016 00:16    Review of Systems  Constitutional: Negative for chills, fever, malaise/fatigue and weight loss.  HENT: Negative.   Eyes: Negative.   Respiratory: Positive for cough, sputum production and shortness of breath.   Cardiovascular: Positive for chest pain. Negative  for orthopnea, leg swelling and PND.  Gastrointestinal: Negative.   Genitourinary: Negative.   Musculoskeletal: Negative.   Skin: Negative.   Neurological: Negative.   Endo/Heme/Allergies: Negative.   Psychiatric/Behavioral: Negative.    Blood pressure 104/66, pulse 64, temperature 98 F (36.7 C), temperature source Oral, resp. rate 20, height _0  (1.727 m), weight 93.6 kg (206 lb 6.4 oz), SpO2 100 %. Physical Exam  Constitutional: He is oriented to person, place, and time. He appears well-developed and well-nourished. No distress.  HENT:  Head: Normocephalic and atraumatic.  Mouth/Throat: Oropharynx is clear and moist.  Eyes: EOM are normal. Pupils are equal, round, and reactive to light.  Neck: Normal range of motion. Neck supple. No JVD present. No thyromegaly present.  Cardiovascular: Normal rate, regular rhythm, normal heart sounds and intact distal pulses.   No murmur heard. Respiratory: Effort normal and breath sounds normal. No respiratory distress. He has no wheezes. He has no rales.  GI: Soft. Bowel sounds are normal. He exhibits no distension and no mass. There is no tenderness.  Musculoskeletal: Normal range of motion. He exhibits no edema.  Lymphadenopathy:    He has no cervical adenopathy.  Neurological: He is alert and oriented to person, place, and time. He has normal strength. No cranial nerve deficit or sensory deficit.  Skin: Skin is warm and dry.  Psychiatric: He has a normal mood and affect.   Physicians   Panel Physicians Referring Physician Case Authorizing Physician  Burnell Blanks, MD (Primary)    Procedures   Left Heart Cath and Coronary Angiography  Conclusion   1. Severe triple vessel CAD 2. The RCA is a large dominant vessel with moderate proximal stenosis and severe focal 99% mid stenosis. The distal vessel is patent and appears to be a good target for bypass. 3. The Circumflex gives off a large obtuse marginal branch. The obtuse  marginal branch is occluded and fills late from left to left collaterals.  4. The LAD is a large caliber vessel that courses to the apex. The proximal and mid vessel has diffuse mild to moderate stenosis. The mid vessel just beyond the second diagonal branch has a 80% stenosis.  5. Left ventricular systolic function is preserved with hypokinesis of the inferior wall.   Recommendations: I will ask CT surgery to see him to discuss CABG. Will resume heparin drip 8 hours post sheath pull. Continue ASA and statin. Add beta blocker if BP tolerates.   Indications   NSTEMI (non-ST elevated myocardial infarction) ([place]) [I21.4 (ICD-10-CM)]  Procedural Details/Technique   Technical Details Indication: 55 yo male with history of HTN, HLD and tobacco abuse admitted with chest pain/NSTEMI.   Procedure: The risks, benefits, complications, treatment options, and expected outcomes were discussed with the patient. The patient and/or family concurred with the proposed plan, giving informed consent. The patient was brought to the cath lab after IV hydration was begun and oral premedication was given. The patient was further sedated with Versed and Fentanyl. The right wrist was prepped and draped in a sterile fashion. 1% lidocaine was used for local anesthesia. Using the modified Seldinger access technique, a 5 French sheath was placed in the right radial artery. 3 mg Verapamil was given through the sheath. 5000 units IV heparin was given. Standard diagnostic catheters were used to perform selective coronary angiography. A pigtail catheter was used to perform a left ventricular angiogram. The sheath was removed from the right radial artery and a Terumo hemostasis band was applied at the arteriotomy site on the right wrist.     Estimated blood loss <50 mL.  During this procedure the patient was administered the following to achieve and maintain moderate conscious sedation: Versed 1 mg, Fentanyl 25 mcg, while the  patient's heart rate, blood pressure, and oxygen saturation were continuously monitored. The period of conscious sedation was 24 minutes, of which I was present face-to-face 100% of this time.    Complications   Complications documented before study signed (05/28/2016 8:42 AM EST)    LEFT HEART CATH AND CORONARY ANGIOGRAPHY   None Documented by Burnell Blanks, MD 05/28/2016 8:42 AM EST  Time Range: Intra-procedure      Coronary Findings   Dominance: Right  Left Anterior Descending  Vessel is large.  Ost LAD to Prox LAD lesion, 40% stenosed.  Prox LAD to Mid LAD lesion, 40% stenosed.  Mid LAD lesion, 80% stenosed.  Dist LAD lesion, 20% stenosed.  First Diagonal Branch  Vessel is moderate in size.  Second Diagonal Branch  Vessel is moderate in size.  Third Diagonal Branch  Vessel is small in size.  Ramus Intermedius  Vessel is small.  Left Circumflex  First Obtuse Marginal Branch  Vessel is large in size.  Ost 1st Mrg to 1st Mrg lesion, 100% stenosed.  Right Coronary Artery  Prox RCA lesion, 50% stenosed.  Prox RCA to Mid RCA lesion, 40% stenosed.  Mid RCA lesion, 99% stenosed.  Dist RCA lesion, 30% stenosed.  Wall Motion              Left Heart   Left Ventricle The left ventricular size is normal. The left ventricular systolic function is normal. LV end diastolic pressure is moderately elevated. The left ventricular ejection fraction is 45-50% by visual estimate. There are LV function abnormalities. There is no evidence of mitral regurgitation.    Coronary Diagrams   Diagnostic Diagram     Implants     No implant documentation for this case.  PACS Images   Show images for Cardiac catheterization   Link to Procedure Log   Procedure Log    Hemo Data   Flowsheet Row Most Recent Value  AO Systolic Pressure 90 mmHg  AO Diastolic Pressure 59 mmHg  AO Mean 74 mmHg  LV Systolic Pressure 94 mmHg  LV Diastolic Pressure 7 mmHg  LV EDP 26 mmHg    Arterial Occlusion Pressure Extended Systolic Pressure 95 mmHg  Arterial Occlusion Pressure Extended Diastolic Pressure 55 mmHg  Arterial Occlusion Pressure Extended Mean Pressure 73 mmHg  Left Ventricular Apex Extended Systolic Pressure 92 mmHg  Left Ventricular Apex Extended Diastolic Pressure 0 mmHg  Left Ventricular Apex Extended EDP Pressure 15 mmHg    Assessment/Plan:  This 55 year old gentleman has 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 I have personally reviewed his cath films and he has anatomy that is probably suitable for bilateral IMA and a left radial artery graft. 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 I reviewed the cath films with the patient and his wife and the surgical plan. I discussed the operative procedure with the patient and his wife including alternatives, benefits and risks; including but not limited to bleeding, blood transfusion, infection, stroke, myocardial infarction, graft failure, heart block requiring a permanent pacemaker, organ dysfunction, and death.  Dennis Winters understands and agrees to proceed.  We will schedule surgery for tomorrow morning. I also discussed the importance of complete smoking cessation and maximum cardiac risk factor reduction.  I spent 60 minutes performing this consultation and > 50% of this time was spent face to face counseling and coordinating the care of this patient's severe multi-vessel coronary artery disease.  Gaye Pollack 05/28/2016, 6:16 PM

## 2016-05-28 NOTE — Progress Notes (Signed)
ANTICOAGULATION CONSULT NOTE - Initial Consult  Pharmacy Consult for heparin Indication: ACS/STEMI begin 8 hrs after sheath out  Allergies  Allergen Reactions  . Penicillins     Has patient had a PCN reaction causing immediate rash, facial/tongue/throat swelling, SOB or lightheadedness with hypotension: Yes Has patient had a PCN reaction causing severe rash involving mucus membranes or skin necrosis: Yes Has patient had a PCN reaction that required hospitalization No Has patient had a PCN reaction occurring within the last 10 years: Non  If all of the above answers are "NO", then may proceed with Cephalosporin use.     Patient Measurements: Height: 5\' 8"  (172.7 cm) Weight: 206 lb 6.4 oz (93.6 kg) IBW/kg (Calculated) : 68.4 Heparin Dosing Weight: 88.6 kg  Vital Signs: Temp: 98.1 F (36.7 C) (12/07 0544) Temp Source: Oral (12/07 0544) BP: 111/66 (12/07 0843) Pulse Rate: 64 (12/07 0836)  Labs:  Recent Labs  05/26/16 2120  05/26/16 2226 05/27/16 0116 05/27/16 0529 05/27/16 0802 05/27/16 1202 05/28/16 0226 05/28/16 0521  HGB 15.3  --   --   --   --   --   --  15.2  --   HCT 45.6  --   --   --   --   --   --  45.4  --   PLT 291  --   --   --   --   --   --  298  --   LABPROT  --   --   --   --   --   --   --   --  13.0  INR  --   --   --   --   --   --   --   --  0.98  CREATININE 0.81  --   --   --   --   --   --  0.89  --   CKTOTAL  --   --  117 99  --   --   --   --   --   CKMB  --   --   --  2.4  --   --   --   --   --   TROPONINI 0.22*  < >  --  0.22* 0.26* 0.27* 0.25*  --   --   < > = values in this interval not displayed.  Estimated Creatinine Clearance: 105.4 mL/min (by C-G formula based on SCr of 0.89 mg/dL).   Medical History: Past Medical History:  Diagnosis Date  . High cholesterol   . Hypertension     Medications:  Prescriptions Prior to Admission  Medication Sig Dispense Refill Last Dose  . aspirin EC 81 MG tablet Take 81 mg by mouth daily.    05/25/2016 at Unknown time  . ibuprofen (ADVIL,MOTRIN) 200 MG tablet Take 800 mg by mouth daily as needed for mild pain or moderate pain.    05/26/2016 at Unknown time  . lisinopril (PRINIVIL,ZESTRIL) 10 MG tablet Take 10 mg by mouth daily.   05/25/2016 at Unknown time  . Multiple Vitamin (MULTIVITAMIN WITH MINERALS) TABS tablet Take 1 tablet by mouth daily.   05/25/2016 at Unknown time  . niacin 500 MG tablet Take 500 mg by mouth daily.   05/25/2016 at Unknown time  . simvastatin (ZOCOR) 40 MG tablet Take 40 mg by mouth daily.   Past Month at Unknown time    Assessment: 55 yo s/p cardiac cath.  Pharmacy consulted to  dose heparin for ACS/STEMI starting 8 hrs after sheath out.  TBW 93.6, HDW 88.6. CBC WNL, creat WNL.  Cath with severe 3V CAD and TCTS to be consulted to discuss CABG.   . Goal of Therapy:  Heparin level 0.3-0.7 units/ml Monitor platelets by anticoagulation protocol: Yes   Plan: still has 6 cc of air in TR band, will f/u with RN when off Start heparin with no bolus 8 hrs after TR band off at rate of 1150 units/hr and check 6 hr heparin level Daily heparin level and CBC while on heparin  Herby AbrahamMichelle T. Hoa Briggs, Pharm.D. [phone] 05/28/2016 10:13 AM

## 2016-05-28 NOTE — Progress Notes (Addendum)
Patient ID: Dennis Winters, male   DOB: [birth year], 55 y.o.   MRN: [id number]    PROGRESS NOTE    Dennis QuickMarshall Westley  WUJ:[id number] DOB: [birth year] DOA: 05/26/2016  PCP: Erasmo DownerStrader, Lindsey F, NP   Brief Narrative:  55 y.o. year old male with HTN, HLD, tobacco use, presented for evaluation of ongoing, 1 week duration of intermittent and throbbing episodes of anterior area chest pain, that initially occurred with exertion and pt unable to walk more than 10 feet. Episodes initially resolved with rest but recurred. He noticed that his episodes were associated with dyspnea, occasional but not consistent radiation to the left arm and shoulder and pt was unable to find comfortable position. Episodes have progressed to more constant in nature and eventually rest did not help with relieving the discomfort. Pt reports recent cold that has gradually improved.   Assessment & Plan:  Unstable angina, 3-vessel CAD with mildly decreased LVEF per cardiac cath - cardiology team has consulted with surgery team for consideration of CABG - Pre CABG Doppler and PFTs ordered. - Hold off ACEi/ARB dose changes until after CABG.  2 cm upper pole right renal mass - incidental finding, will require additional evaluation to exclude a significant neoplasm - Abdominal MRI or CT without and with contrast is recommended and can be performed on a nonemergent basis - will notify PCP for follow up   HLD - continue statin   1-2 cm nodular density left lung base - CT chest without contrast recommended to further evaluate but did not recognize the finding reported on CXR  Obesity  - Body mass index is 31.38 kg/m.  DVT prophylaxis: Heparin drip  Code Status: Full  Family Communication: Patient at bedside  Disposition Plan: Home when cardiology clears   Consultants:   Cardiology  Cardiothoracic surgery team   Procedures:   Cardiac cath 12/07   Antimicrobials:   None   Subjective: No events overnight.  Reports no concerns post cardiac cath.   Objective: Vitals:   05/28/16 1002 05/28/16 1032 05/28/16 1100 05/28/16 1200  BP: 90/76 100/61 118/75 104/66  Pulse: (!) 59 (!) 56 75 64  Resp: 18 20 20 20   Temp:    98 F (36.7 C)  TempSrc:    Oral  SpO2: 100% 97% 100% 100%  Weight:      Height:        Intake/Output Summary (Last 24 hours) at 05/28/16 1750 Last data filed at 05/28/16 1500  Gross per 24 hour  Intake                0 ml  Output                0 ml  Net                0 ml   Filed Weights   05/26/16 1901 05/27/16 0510 05/28/16 0544  Weight: 93 kg (205 lb) 95.8 kg (211 lb 4.8 oz) 93.6 kg (206 lb 6.4 oz)    Examination:  General exam: Appears calm and comfortable  Respiratory system: Clear to auscultation. Respiratory effort normal. Cardiovascular system: S1 & S2 heard, RRR. No JVD, murmurs, rubs, gallops or clicks. No pedal edema. Gastrointestinal system: Abdomen is nondistended, soft and nontender. No organomegaly or masses felt. Normal bowel sounds heard. Central nervous system: Alert and oriented. No focal neurological deficits. Extremities: Symmetric 5 x 5 power.  Data Reviewed: I have personally reviewed following labs and imaging studies  CBC:  Recent  Labs Lab 05/26/16 2120 05/28/16 0226  WBC 7.6 7.8  HGB 15.3 15.2  HCT 45.6 45.4  MCV 96.6 96.6  PLT 291 298   Basic Metabolic Panel:  Recent Labs Lab 05/26/16 2120 05/28/16 0226  NA 140 142  K 3.6 3.6  CL 107 107  CO2 25 26  GLUCOSE 114* 113*  BUN 7 6  CREATININE 0.81 0.89  CALCIUM 8.8* 8.7*   Liver Function Tests:  Recent Labs Lab 05/26/16 2226  AST 18  ALT 17  ALKPHOS 76  BILITOT 0.3  PROT 6.7  ALBUMIN 3.6    Recent Labs Lab 05/26/16 2226  LIPASE 36   Coagulation Profile:  Recent Labs Lab 05/28/16 0521  INR 0.98   Cardiac Enzymes:  Recent Labs Lab 05/26/16 2219 05/26/16 2226 05/27/16 0116 05/27/16 0529 05/27/16 0802 05/27/16 1202  CKTOTAL  --  117 99  --    --   --   CKMB  --   --  2.4  --   --   --   TROPONINI 0.23*  --  0.22* 0.26* 0.27* 0.25*   Radiology Studies: Dg Chest 2 View Result Date: 05/26/2016 1-2 cm nodular density left lung base. CT chest without contrast recommended to further evaluate. Low volume film with chronic interstitial coarsening.   Ct Angio Chest Pe W And/or Wo Contrast Result Date: 05/27/2016 An incidental finding of potential clinical significance has been found. There is a 2 cm upper pole right renal mass which requires additional evaluation to exclude a significant neoplasm. Abdominal MRI or CT without and with contrast is recommended. This can be performed on a nonemergent basis.** 2. Negative for pulmonary embolism or other acute abnormality in the chest.  Scheduled Meds: . aspirin EC  81 mg Oral Daily  . atorvastatin  80 mg Oral q1800  . ketorolac  30 mg Intravenous Once  . lisinopril  10 mg Oral Daily  . sodium chloride flush  3 mL Intravenous Q12H   Continuous Infusions: . heparin      LOS: 0 days   Time spent: 20 minutes   Debbora PrestoMAGICK-Uzoma Vivona, MD [facility] Pager [phone]  If 7PM-7AM, please contact night-coverage [email or web address] Password [email or web address] 05/28/2016, 5:50 PM

## 2016-05-28 NOTE — Progress Notes (Signed)
Pt TR band removed; site clean, dry and intact with no active bleeding noted. Level 0; VSS; clean sterile dsg applied to site. Pt family remains at bedside; call light within reach and pt sleeping. Will continue to closely monitor. Dionne BucyP. Amo Hershell Brandl RN

## 2016-05-29 ENCOUNTER — Inpatient Hospital Stay (HOSPITAL_COMMUNITY): Payer: 59 | Admitting: Certified Registered"

## 2016-05-29 ENCOUNTER — Encounter (HOSPITAL_COMMUNITY)
Admission: EM | Disposition: A | Discharge status: Home / Self Care | Payer: Self-pay | Source: Home / Self Care | Attending: Surgery

## 2016-05-29 ENCOUNTER — Inpatient Hospital Stay (HOSPITAL_COMMUNITY): Payer: 59

## 2016-05-29 ENCOUNTER — Other Ambulatory Visit (HOSPITAL_COMMUNITY): Payer: 59

## 2016-05-29 ENCOUNTER — Encounter (HOSPITAL_COMMUNITY): Payer: Self-pay | Admitting: Certified Registered"

## 2016-05-29 DIAGNOSIS — Z951 Presence of aortocoronary bypass graft: Secondary | ICD-10-CM

## 2016-05-29 HISTORY — PX: RADIAL ARTERY HARVEST: SHX5067

## 2016-05-29 HISTORY — PX: TEE WITHOUT CARDIOVERSION: SHX5443

## 2016-05-29 HISTORY — PX: CORONARY ARTERY BYPASS GRAFT: SHX141

## 2016-05-29 LAB — POCT I-STAT, CHEM 8
BUN: 4 mg/dL — AB (ref 6–20)
BUN: 4 mg/dL — AB (ref 6–20)
BUN: 4 mg/dL — AB (ref 6–20)
BUN: 4 mg/dL — ABNORMAL LOW (ref 6–20)
BUN: 3 mg/dL — ABNORMAL LOW (ref 6–20)
BUN: 4 mg/dL — ABNORMAL LOW (ref 6–20)
BUN: 4 mg/dL — ABNORMAL LOW (ref 6–20)
BUN: 8 mg/dL (ref 6–20)
CALCIUM ION: 1.19 mmol/L (ref 1.15–1.40)
CALCIUM ION: 1.2 mmol/L (ref 1.15–1.40)
CALCIUM ION: 1.19 mmol/L (ref 1.15–1.40)
CALCIUM ION: 1.18 mmol/L (ref 1.15–1.40)
CALCIUM ION: 1.01 mmol/L — AB (ref 1.15–1.40)
CALCIUM ION: 1.05 mmol/L — AB (ref 1.15–1.40)
CALCIUM ION: 1.12 mmol/L — AB (ref 1.15–1.40)
CHLORIDE: 103 mmol/L (ref 101–111)
CHLORIDE: 102 mmol/L (ref 101–111)
CHLORIDE: 101 mmol/L (ref 101–111)
CHLORIDE: 103 mmol/L (ref 101–111)
CREATININE: 0.7 mg/dL (ref 0.61–1.24)
CREATININE: 0.8 mg/dL (ref 0.61–1.24)
CREATININE: 0.7 mg/dL (ref 0.61–1.24)
CREATININE: 0.8 mg/dL (ref 0.61–1.24)
CREATININE: 0.8 mg/dL (ref 0.61–1.24)
Calcium, Ion: 1.17 mmol/L (ref 1.15–1.40)
Chloride: 103 mmol/L (ref 101–111)
Chloride: 104 mmol/L (ref 101–111)
Chloride: 104 mmol/L (ref 101–111)
Chloride: 107 mmol/L (ref 101–111)
Creatinine, Ser: 0.5 mg/dL — ABNORMAL LOW (ref 0.61–1.24)
Creatinine, Ser: 0.6 mg/dL — ABNORMAL LOW (ref 0.61–1.24)
Creatinine, Ser: 0.8 mg/dL (ref 0.61–1.24)
GLUCOSE: 91 mg/dL (ref 65–99)
GLUCOSE: 95 mg/dL (ref 65–99)
GLUCOSE: 100 mg/dL — AB (ref 65–99)
GLUCOSE: 95 mg/dL (ref 65–99)
GLUCOSE: 142 mg/dL — AB (ref 65–99)
Glucose, Bld: 114 mg/dL — ABNORMAL HIGH (ref 65–99)
Glucose, Bld: 129 mg/dL — ABNORMAL HIGH (ref 65–99)
Glucose, Bld: 125 mg/dL — ABNORMAL HIGH (ref 65–99)
HCT: 40 % (ref 39.0–52.0)
HCT: 40 % (ref 39.0–52.0)
HCT: 40 % (ref 39.0–52.0)
HCT: 33 % — ABNORMAL LOW (ref 39.0–52.0)
HCT: 34 % — ABNORMAL LOW (ref 39.0–52.0)
HEMATOCRIT: 39 % (ref 39.0–52.0)
HEMATOCRIT: 34 % — AB (ref 39.0–52.0)
HEMATOCRIT: 42 % (ref 39.0–52.0)
HEMOGLOBIN: 13.6 g/dL (ref 13.0–17.0)
HEMOGLOBIN: 13.6 g/dL (ref 13.0–17.0)
HEMOGLOBIN: 11.6 g/dL — AB (ref 13.0–17.0)
Hemoglobin: 13.3 g/dL (ref 13.0–17.0)
Hemoglobin: 13.6 g/dL (ref 13.0–17.0)
Hemoglobin: 11.2 g/dL — ABNORMAL LOW (ref 13.0–17.0)
Hemoglobin: 11.6 g/dL — ABNORMAL LOW (ref 13.0–17.0)
Hemoglobin: 14.3 g/dL (ref 13.0–17.0)
POTASSIUM: 4.3 mmol/L (ref 3.5–5.1)
POTASSIUM: 4.6 mmol/L (ref 3.5–5.1)
POTASSIUM: 5.4 mmol/L — AB (ref 3.5–5.1)
Potassium: 4 mmol/L (ref 3.5–5.1)
Potassium: 4.2 mmol/L (ref 3.5–5.1)
Potassium: 4.3 mmol/L (ref 3.5–5.1)
Potassium: 4.4 mmol/L (ref 3.5–5.1)
Potassium: 4 mmol/L (ref 3.5–5.1)
SODIUM: 141 mmol/L (ref 135–145)
SODIUM: 141 mmol/L (ref 135–145)
SODIUM: 139 mmol/L (ref 135–145)
SODIUM: 139 mmol/L (ref 135–145)
SODIUM: 137 mmol/L (ref 135–145)
Sodium: 142 mmol/L (ref 135–145)
Sodium: 141 mmol/L (ref 135–145)
Sodium: 138 mmol/L (ref 135–145)
TCO2: 28 mmol/L (ref 0–100)
TCO2: 27 mmol/L (ref 0–100)
TCO2: 27 mmol/L (ref 0–100)
TCO2: 27 mmol/L (ref 0–100)
TCO2: 30 mmol/L (ref 0–100)
TCO2: 26 mmol/L (ref 0–100)
TCO2: 28 mmol/L (ref 0–100)
TCO2: 24 mmol/L (ref 0–100)

## 2016-05-29 LAB — GLUCOSE, CAPILLARY
GLUCOSE-CAPILLARY: 127 mg/dL — AB (ref 65–99)
GLUCOSE-CAPILLARY: 118 mg/dL — AB (ref 65–99)
GLUCOSE-CAPILLARY: 122 mg/dL — AB (ref 65–99)
GLUCOSE-CAPILLARY: 129 mg/dL — AB (ref 65–99)
GLUCOSE-CAPILLARY: 150 mg/dL — AB (ref 65–99)
GLUCOSE-CAPILLARY: 146 mg/dL — AB (ref 65–99)
Glucose-Capillary: 130 mg/dL — ABNORMAL HIGH (ref 65–99)
Glucose-Capillary: 129 mg/dL — ABNORMAL HIGH (ref 65–99)

## 2016-05-29 LAB — BASIC METABOLIC PANEL
Anion gap: 8 (ref 5–15)
CHLORIDE: 107 mmol/L (ref 101–111)
CO2: 26 mmol/L (ref 22–32)
CREATININE: 0.84 mg/dL (ref 0.61–1.24)
Calcium: 8.6 mg/dL — ABNORMAL LOW (ref 8.9–10.3)
GFR calc non Af Amer: >60 mL/min (ref >60)
GLUCOSE: 118 mg/dL — AB (ref 65–99)
Potassium: 3.7 mmol/L (ref 3.5–5.1)
Sodium: 141 mmol/L (ref 135–145)

## 2016-05-29 LAB — PLATELET COUNT: Platelets: 220 10*3/uL (ref 150–400)

## 2016-05-29 LAB — CBC
HCT: 38.6 % — ABNORMAL LOW (ref 39.0–52.0)
HEMATOCRIT: 45.5 % (ref 39.0–52.0)
HEMATOCRIT: 43.3 % (ref 39.0–52.0)
HEMOGLOBIN: 15.1 g/dL (ref 13.0–17.0)
HEMOGLOBIN: 14.6 g/dL (ref 13.0–17.0)
Hemoglobin: 12.7 g/dL — ABNORMAL LOW (ref 13.0–17.0)
MCH: 32 pg (ref 26.0–34.0)
MCH: 31.6 pg (ref 26.0–34.0)
MCH: 32.1 pg (ref 26.0–34.0)
MCHC: 33.2 g/dL (ref 30.0–36.0)
MCHC: 32.9 g/dL (ref 30.0–36.0)
MCHC: 33.7 g/dL (ref 30.0–36.0)
MCV: 96.4 fL (ref 78.0–100.0)
MCV: 96 fL (ref 78.0–100.0)
MCV: 95.2 fL (ref 78.0–100.0)
PLATELETS: 180 10*3/uL (ref 150–400)
Platelets: 269 10*3/uL (ref 150–400)
Platelets: 234 10*3/uL (ref 150–400)
RBC: 4.72 MIL/uL (ref 4.22–5.81)
RBC: 4.02 MIL/uL — AB (ref 4.22–5.81)
RBC: 4.55 MIL/uL (ref 4.22–5.81)
RDW: 13.4 % (ref 11.5–15.5)
RDW: 13.4 % (ref 11.5–15.5)
RDW: 13.3 % (ref 11.5–15.5)
WBC: 8.4 10*3/uL (ref 4.0–10.5)
WBC: 15.6 10*3/uL — AB (ref 4.0–10.5)
WBC: 19.4 10*3/uL — ABNORMAL HIGH (ref 4.0–10.5)

## 2016-05-29 LAB — POCT I-STAT 4, (NA,K, GLUC, HGB,HCT)
Glucose, Bld: 111 mg/dL — ABNORMAL HIGH (ref 65–99)
HCT: 38 % — ABNORMAL LOW (ref 39.0–52.0)
HEMOGLOBIN: 12.9 g/dL — AB (ref 13.0–17.0)
Potassium: 3.9 mmol/L (ref 3.5–5.1)
SODIUM: 142 mmol/L (ref 135–145)

## 2016-05-29 LAB — POCT I-STAT 3, ART BLOOD GAS (G3+)
ACID-BASE EXCESS: 2 mmol/L (ref 0.0–2.0)
ACID-BASE EXCESS: 1 mmol/L (ref 0.0–2.0)
Acid-Base Excess: 6 mmol/L — ABNORMAL HIGH (ref 0.0–2.0)
Acid-base deficit: 1 mmol/L (ref 0.0–2.0)
BICARBONATE: 26.3 mmol/L (ref 20.0–28.0)
Bicarbonate: 28.5 mmol/L — ABNORMAL HIGH (ref 20.0–28.0)
Bicarbonate: 31.2 mmol/L — ABNORMAL HIGH (ref 20.0–28.0)
Bicarbonate: 24.7 mmol/L (ref 20.0–28.0)
Bicarbonate: 26.6 mmol/L (ref 20.0–28.0)
Bicarbonate: 25.4 mmol/L (ref 20.0–28.0)
O2 SAT: 100 %
O2 SAT: 92 %
O2 SAT: 90 %
O2 Saturation: 100 %
O2 Saturation: 94 %
O2 Saturation: 92 %
PCO2 ART: 51.7 mmHg — AB (ref 32.0–48.0)
PCO2 ART: 42.4 mmHg (ref 32.0–48.0)
PCO2 ART: 51.5 mmHg — AB (ref 32.0–48.0)
PH ART: 7.428 (ref 7.350–7.450)
PH ART: 7.373 (ref 7.350–7.450)
PO2 ART: 310 mmHg — AB (ref 83.0–108.0)
PO2 ART: 71 mmHg — AB (ref 83.0–108.0)
PO2 ART: 67 mmHg — AB (ref 83.0–108.0)
Patient temperature: 37
Patient temperature: 37.9
Patient temperature: 37.6
TCO2: 30 mmol/L (ref 0–100)
TCO2: 33 mmol/L (ref 0–100)
TCO2: 26 mmol/L (ref 0–100)
TCO2: 28 mmol/L (ref 0–100)
TCO2: 27 mmol/L (ref 0–100)
TCO2: 28 mmol/L (ref 0–100)
pCO2 arterial: 47.2 mmHg (ref 32.0–48.0)
pCO2 arterial: 44.9 mmHg (ref 32.0–48.0)
pCO2 arterial: 45.1 mmHg (ref 32.0–48.0)
pH, Arterial: 7.349 — ABNORMAL LOW (ref 7.350–7.450)
pH, Arterial: 7.322 — ABNORMAL LOW (ref 7.350–7.450)
pH, Arterial: 7.364 (ref 7.350–7.450)
pH, Arterial: 7.377 (ref 7.350–7.450)
pO2, Arterial: 506 mmHg — ABNORMAL HIGH (ref 83.0–108.0)
pO2, Arterial: 75 mmHg — ABNORMAL LOW (ref 83.0–108.0)
pO2, Arterial: 65 mmHg — ABNORMAL LOW (ref 83.0–108.0)

## 2016-05-29 LAB — HEMOGLOBIN AND HEMATOCRIT, BLOOD
HCT: 36.5 % — ABNORMAL LOW (ref 39.0–52.0)
Hemoglobin: 12.4 g/dL — ABNORMAL LOW (ref 13.0–17.0)

## 2016-05-29 LAB — MAGNESIUM: Magnesium: 3.1 mg/dL — ABNORMAL HIGH (ref 1.7–2.4)

## 2016-05-29 LAB — PROTIME-INR
INR: 1.38
PROTHROMBIN TIME: 17.1 s — AB (ref 11.4–15.2)

## 2016-05-29 LAB — HEPARIN LEVEL (UNFRACTIONATED): Heparin Unfractionated: <0.1 IU/mL — ABNORMAL LOW (ref 0.30–0.70)

## 2016-05-29 LAB — CREATININE, SERUM
CREATININE: 0.82 mg/dL (ref 0.61–1.24)
GFR calc Af Amer: >60 mL/min (ref >60)
GFR calc non Af Amer: >60 mL/min (ref >60)

## 2016-05-29 LAB — APTT: aPTT: 32 seconds (ref 24–36)

## 2016-05-29 LAB — SURGICAL PCR SCREEN
MRSA, PCR: NEGATIVE
Staphylococcus aureus: POSITIVE — AB

## 2016-05-29 SURGERY — CORONARY ARTERY BYPASS GRAFTING (CABG)
Anesthesia: General | Site: Chest

## 2016-05-29 MED ORDER — THROMBIN 20000 UNITS EX SOLR
CUTANEOUS | Status: AC
  Filled 2016-05-29: qty 20000

## 2016-05-29 MED ORDER — PROTAMINE SULFATE 10 MG/ML IV SOLN
INTRAVENOUS | Status: AC
  Filled 2016-05-29: qty 5

## 2016-05-29 MED ORDER — MORPHINE SULFATE (PF) 2 MG/ML IV SOLN
2.0000 mg | INTRAVENOUS | Status: DC | PRN
  Administered 2016-05-29: 2 mg via INTRAVENOUS
  Administered 2016-05-29: 4 mg via INTRAVENOUS
  Administered 2016-05-29 (×2): 2 mg via INTRAVENOUS
  Administered 2016-05-30: 5 mg via INTRAVENOUS
  Administered 2016-05-30: 2 mg via INTRAVENOUS
  Administered 2016-05-30: 4 mg via INTRAVENOUS
  Administered 2016-05-30 (×2): 2 mg via INTRAVENOUS
  Filled 2016-05-29: qty 2
  Filled 2016-05-29: qty 1
  Filled 2016-05-29: qty 3
  Filled 2016-05-29: qty 2

## 2016-05-29 MED ORDER — FENTANYL CITRATE (PF) 250 MCG/5ML IJ SOLN
INTRAMUSCULAR | Status: AC
  Filled 2016-05-29: qty 5

## 2016-05-29 MED ORDER — SODIUM CHLORIDE 0.9 % IV SOLN
30.0000 meq | Freq: Once | INTRAVENOUS | Status: AC
  Administered 2016-05-29: 30 meq via INTRAVENOUS
  Filled 2016-05-29: qty 15

## 2016-05-29 MED ORDER — THROMBIN 20000 UNITS EX SOLR
OROMUCOSAL | Status: DC | PRN
  Administered 2016-05-29: 4 mL via TOPICAL

## 2016-05-29 MED ORDER — ACETAMINOPHEN 160 MG/5ML PO SOLN: 1000.0000 mg | Freq: Four times a day (QID) | ORAL | Status: AC

## 2016-05-29 MED ORDER — PANTOPRAZOLE SODIUM 40 MG PO TBEC
40.0000 mg | DELAYED_RELEASE_TABLET | Freq: Every day | ORAL | Status: DC
  Administered 2016-05-31 – 2016-06-04 (×5): 40 mg via ORAL
  Filled 2016-05-29 (×5): qty 1

## 2016-05-29 MED ORDER — PHENYLEPHRINE 40 MCG/ML (10ML) SYRINGE FOR IV PUSH (FOR BLOOD PRESSURE SUPPORT)
PREFILLED_SYRINGE | INTRAVENOUS | Status: AC
  Filled 2016-05-29: qty 10

## 2016-05-29 MED ORDER — OXYCODONE HCL 5 MG PO TABS
5.0000 mg | ORAL_TABLET | ORAL | Status: DC | PRN
  Administered 2016-05-29 – 2016-05-30 (×2): 5 mg via ORAL
  Administered 2016-05-30: 10 mg via ORAL
  Administered 2016-05-30: 5 mg via ORAL
  Administered 2016-05-30: 10 mg via ORAL
  Administered 2016-05-31 (×2): 5 mg via ORAL
  Administered 2016-05-31 – 2016-06-01 (×4): 10 mg via ORAL
  Administered 2016-06-01: 5 mg via ORAL
  Administered 2016-06-02 – 2016-06-04 (×14): 10 mg via ORAL
  Filled 2016-05-29 (×2): qty 2
  Filled 2016-05-29: qty 1
  Filled 2016-05-29 (×5): qty 2
  Filled 2016-05-29 (×4): qty 1
  Filled 2016-05-29 (×15): qty 2

## 2016-05-29 MED ORDER — ROCURONIUM BROMIDE 10 MG/ML (PF) SYRINGE
PREFILLED_SYRINGE | INTRAVENOUS | Status: DC | PRN
  Administered 2016-05-29 (×5): 50 mg via INTRAVENOUS
  Administered 2016-05-29: 30 mg via INTRAVENOUS
  Administered 2016-05-29: 50 mg via INTRAVENOUS

## 2016-05-29 MED ORDER — VANCOMYCIN HCL IN DEXTROSE 1-5 GM/200ML-% IV SOLN
1000.0000 mg | Freq: Once | INTRAVENOUS | Status: AC
  Administered 2016-05-29: 1000 mg via INTRAVENOUS
  Filled 2016-05-29: qty 200

## 2016-05-29 MED ORDER — MAGNESIUM SULFATE 4 GM/100ML IV SOLN
4.0000 g | Freq: Once | INTRAVENOUS | Status: AC
  Administered 2016-05-29: 4 g via INTRAVENOUS
  Filled 2016-05-29: qty 100

## 2016-05-29 MED ORDER — ARTIFICIAL TEARS OP OINT
TOPICAL_OINTMENT | OPHTHALMIC | Status: AC
  Filled 2016-05-29: qty 3.5

## 2016-05-29 MED ORDER — CHLORHEXIDINE GLUCONATE 0.12% ORAL RINSE (MEDLINE KIT)
15.0000 mL | Freq: Two times a day (BID) | OROMUCOSAL | Status: DC
  Administered 2016-05-29: 15 mL via OROMUCOSAL

## 2016-05-29 MED ORDER — MIDAZOLAM HCL 10 MG/2ML IJ SOLN
INTRAMUSCULAR | Status: AC
  Filled 2016-05-29: qty 2

## 2016-05-29 MED ORDER — LACTATED RINGERS IV SOLN
INTRAVENOUS | Status: DC | PRN
  Administered 2016-05-29: 07:00:00 via INTRAVENOUS

## 2016-05-29 MED ORDER — CHLORHEXIDINE GLUCONATE 0.12 % MT SOLN
15.0000 mL | OROMUCOSAL | Status: AC
  Administered 2016-05-29: 15 mL via OROMUCOSAL

## 2016-05-29 MED ORDER — MUPIROCIN 2 % EX OINT
1.0000 "application " | TOPICAL_OINTMENT | Freq: Two times a day (BID) | CUTANEOUS | Status: AC
  Administered 2016-05-29 – 2016-06-03 (×10): 1 via NASAL
  Filled 2016-05-29 (×2): qty 22

## 2016-05-29 MED ORDER — MIDAZOLAM HCL 5 MG/5ML IJ SOLN
INTRAMUSCULAR | Status: DC | PRN
  Administered 2016-05-29: 2 mg via INTRAVENOUS
  Administered 2016-05-29: 1 mg via INTRAVENOUS
  Administered 2016-05-29: 3 mg via INTRAVENOUS
  Administered 2016-05-29 (×3): 2 mg via INTRAVENOUS
  Administered 2016-05-29 (×2): 1 mg via INTRAVENOUS

## 2016-05-29 MED ORDER — ACETAMINOPHEN 160 MG/5ML PO SOLN: 650.0000 mg | Freq: Once | ORAL | Status: AC

## 2016-05-29 MED ORDER — DEXMEDETOMIDINE HCL IN NACL 200 MCG/50ML IV SOLN
INTRAVENOUS | Status: AC
  Filled 2016-05-29: qty 50

## 2016-05-29 MED ORDER — HEMOSTATIC AGENTS (NO CHARGE) OPTIME
TOPICAL | Status: DC | PRN
  Administered 2016-05-29: 1 via TOPICAL

## 2016-05-29 MED ORDER — ACETAMINOPHEN 650 MG RE SUPP
650.0000 mg | Freq: Once | RECTAL | Status: AC
  Administered 2016-05-29: 650 mg via RECTAL

## 2016-05-29 MED ORDER — ASPIRIN 81 MG PO CHEW: 324.0000 mg | CHEWABLE_TABLET | Freq: Every day | ORAL | Status: DC

## 2016-05-29 MED ORDER — SODIUM CHLORIDE 0.9 % IV SOLN
INTRAVENOUS | Status: DC | PRN
  Administered 2016-05-29: 14:00:00 via INTRAVENOUS

## 2016-05-29 MED ORDER — ALBUMIN HUMAN 5 % IV SOLN: 250.0000 mL | INTRAVENOUS | Status: AC | PRN

## 2016-05-29 MED ORDER — ORAL CARE MOUTH RINSE: 15.0000 mL | Freq: Four times a day (QID) | OROMUCOSAL | Status: DC

## 2016-05-29 MED ORDER — ROCURONIUM BROMIDE 10 MG/ML (PF) SYRINGE
PREFILLED_SYRINGE | INTRAVENOUS | Status: AC
Start: 2016-05-29 — End: 2016-05-29
  Filled 2016-05-29: qty 10

## 2016-05-29 MED ORDER — INSULIN REGULAR BOLUS VIA INFUSION
0.0000 [IU] | Freq: Three times a day (TID) | INTRAVENOUS | Status: DC
  Filled 2016-05-29: qty 10

## 2016-05-29 MED ORDER — METOPROLOL TARTRATE 12.5 MG HALF TABLET
12.5000 mg | ORAL_TABLET | Freq: Two times a day (BID) | ORAL | Status: DC
  Administered 2016-05-31 – 2016-06-04 (×9): 12.5 mg via ORAL
  Filled 2016-05-29 (×11): qty 1

## 2016-05-29 MED ORDER — PHENYLEPHRINE HCL 10 MG/ML IJ SOLN
INTRAMUSCULAR | Status: DC | PRN
Start: 2016-05-29 — End: 2016-05-29
  Administered 2016-05-29: 80 ug via INTRAVENOUS
  Administered 2016-05-29: 120 ug via INTRAVENOUS
  Administered 2016-05-29: 80 ug via INTRAVENOUS

## 2016-05-29 MED ORDER — TRAMADOL HCL 50 MG PO TABS
50.0000 mg | ORAL_TABLET | ORAL | Status: DC | PRN
  Administered 2016-05-31 – 2016-06-04 (×4): 100 mg via ORAL
  Filled 2016-05-29 (×4): qty 2

## 2016-05-29 MED ORDER — ASPIRIN EC 325 MG PO TBEC
325.0000 mg | DELAYED_RELEASE_TABLET | Freq: Every day | ORAL | Status: DC
  Administered 2016-05-30 – 2016-06-04 (×6): 325 mg via ORAL
  Filled 2016-05-29 (×6): qty 1

## 2016-05-29 MED ORDER — MIDAZOLAM HCL 2 MG/2ML IJ SOLN
INTRAMUSCULAR | Status: AC
  Filled 2016-05-29: qty 2

## 2016-05-29 MED ORDER — BISACODYL 10 MG RE SUPP: 10.0000 mg | Freq: Every day | RECTAL | Status: DC

## 2016-05-29 MED ORDER — SODIUM CHLORIDE 0.9 % IV SOLN
INTRAVENOUS | Status: DC
  Administered 2016-05-29: 16:00:00 via INTRAVENOUS
  Filled 2016-05-29 (×2): qty 2.5

## 2016-05-29 MED ORDER — SODIUM CHLORIDE 0.9 % IV SOLN
INTRAVENOUS | Status: DC
  Administered 2016-05-30: 05:00:00 via INTRAVENOUS

## 2016-05-29 MED ORDER — PROTAMINE SULFATE 10 MG/ML IV SOLN
INTRAVENOUS | Status: DC | PRN
  Administered 2016-05-29: 150 mg via INTRAVENOUS
  Administered 2016-05-29: 50 mg via INTRAVENOUS
  Administered 2016-05-29: 60 mg via INTRAVENOUS
  Administered 2016-05-29: 50 mg via INTRAVENOUS

## 2016-05-29 MED ORDER — FENTANYL CITRATE (PF) 250 MCG/5ML IJ SOLN
INTRAMUSCULAR | Status: DC | PRN
  Administered 2016-05-29: 400 ug via INTRAVENOUS
  Administered 2016-05-29: 100 ug via INTRAVENOUS
  Administered 2016-05-29: 150 ug via INTRAVENOUS
  Administered 2016-05-29: 100 ug via INTRAVENOUS
  Administered 2016-05-29: 500 ug via INTRAVENOUS
  Administered 2016-05-29: 250 ug via INTRAVENOUS
  Administered 2016-05-29: 150 ug via INTRAVENOUS
  Administered 2016-05-29: 100 ug via INTRAVENOUS

## 2016-05-29 MED ORDER — PROPOFOL 10 MG/ML IV BOLUS
INTRAVENOUS | Status: DC | PRN
  Administered 2016-05-29: 50 mg via INTRAVENOUS

## 2016-05-29 MED ORDER — SODIUM CHLORIDE 0.9 % IV SOLN
250.0000 mL | INTRAVENOUS | Status: DC
  Administered 2016-05-30: 250 mL via INTRAVENOUS

## 2016-05-29 MED ORDER — METOPROLOL TARTRATE 5 MG/5ML IV SOLN: 2.5000 mg | INTRAVENOUS | Status: DC | PRN

## 2016-05-29 MED ORDER — ALBUMIN HUMAN 5 % IV SOLN
INTRAVENOUS | Status: DC | PRN
  Administered 2016-05-29: 14:00:00 via INTRAVENOUS

## 2016-05-29 MED ORDER — LACTATED RINGERS IV SOLN: INTRAVENOUS | Status: DC

## 2016-05-29 MED ORDER — ROCURONIUM BROMIDE 10 MG/ML (PF) SYRINGE
PREFILLED_SYRINGE | INTRAVENOUS | Status: AC
  Filled 2016-05-29: qty 10

## 2016-05-29 MED ORDER — NITROGLYCERIN IN D5W 200-5 MCG/ML-% IV SOLN: 0.0000 ug/min | INTRAVENOUS | Status: DC

## 2016-05-29 MED ORDER — ACETAMINOPHEN 500 MG PO TABS
1000.0000 mg | ORAL_TABLET | Freq: Four times a day (QID) | ORAL | Status: AC
  Administered 2016-05-29 – 2016-06-03 (×19): 1000 mg via ORAL
  Filled 2016-05-29 (×20): qty 2

## 2016-05-29 MED ORDER — FENTANYL CITRATE (PF) 250 MCG/5ML IJ SOLN
INTRAMUSCULAR | Status: AC
  Filled 2016-05-29: qty 10

## 2016-05-29 MED ORDER — HEPARIN SODIUM (PORCINE) 1000 UNIT/ML IJ SOLN
INTRAMUSCULAR | Status: AC
  Filled 2016-05-29: qty 1

## 2016-05-29 MED ORDER — MIDAZOLAM HCL 2 MG/2ML IJ SOLN: 2.0000 mg | INTRAMUSCULAR | Status: DC | PRN

## 2016-05-29 MED ORDER — KETOROLAC TROMETHAMINE 15 MG/ML IJ SOLN
15.0000 mg | Freq: Four times a day (QID) | INTRAMUSCULAR | Status: DC
  Administered 2016-05-29 – 2016-05-30 (×3): 15 mg via INTRAVENOUS
  Filled 2016-05-29 (×3): qty 1

## 2016-05-29 MED ORDER — PROTAMINE SULFATE 10 MG/ML IV SOLN
INTRAVENOUS | Status: AC
  Filled 2016-05-29: qty 25

## 2016-05-29 MED ORDER — METOPROLOL TARTRATE 25 MG/10 ML ORAL SUSPENSION: 12.5000 mg | Freq: Two times a day (BID) | ORAL | Status: DC

## 2016-05-29 MED ORDER — CHLORHEXIDINE GLUCONATE CLOTH 2 % EX PADS
6.0000 | MEDICATED_PAD | Freq: Every day | CUTANEOUS | Status: AC
  Administered 2016-05-29 – 2016-06-02 (×5): 6 via TOPICAL

## 2016-05-29 MED ORDER — FAMOTIDINE IN NACL 20-0.9 MG/50ML-% IV SOLN
20.0000 mg | Freq: Two times a day (BID) | INTRAVENOUS | Status: DC
  Administered 2016-05-29: 20 mg via INTRAVENOUS

## 2016-05-29 MED ORDER — BISACODYL 5 MG PO TBEC
10.0000 mg | DELAYED_RELEASE_TABLET | Freq: Every day | ORAL | Status: DC
  Administered 2016-05-30 – 2016-06-03 (×4): 10 mg via ORAL
  Filled 2016-05-29 (×6): qty 2

## 2016-05-29 MED ORDER — SODIUM CHLORIDE 0.9% FLUSH: 3.0000 mL | INTRAVENOUS | Status: DC | PRN

## 2016-05-29 MED ORDER — LACTATED RINGERS IV SOLN
INTRAVENOUS | Status: DC
  Administered 2016-05-30: 05:00:00 via INTRAVENOUS

## 2016-05-29 MED ORDER — DOCUSATE SODIUM 100 MG PO CAPS
200.0000 mg | ORAL_CAPSULE | Freq: Every day | ORAL | Status: DC
  Administered 2016-05-30 – 2016-06-03 (×5): 200 mg via ORAL
  Filled 2016-05-29 (×6): qty 2

## 2016-05-29 MED ORDER — HEPARIN SODIUM (PORCINE) 1000 UNIT/ML IJ SOLN
INTRAMUSCULAR | Status: DC | PRN
  Administered 2016-05-29: 31000 [IU] via INTRAVENOUS

## 2016-05-29 MED ORDER — ORAL CARE MOUTH RINSE
15.0000 mL | Freq: Two times a day (BID) | OROMUCOSAL | Status: DC
  Administered 2016-05-29 – 2016-06-01 (×4): 15 mL via OROMUCOSAL

## 2016-05-29 MED ORDER — ONDANSETRON HCL 4 MG/2ML IJ SOLN
4.0000 mg | Freq: Four times a day (QID) | INTRAMUSCULAR | Status: DC | PRN
  Administered 2016-06-01 – 2016-06-03 (×2): 4 mg via INTRAVENOUS
  Filled 2016-05-29 (×2): qty 2

## 2016-05-29 MED ORDER — PROPOFOL 10 MG/ML IV BOLUS
INTRAVENOUS | Status: AC
  Filled 2016-05-29: qty 20

## 2016-05-29 MED ORDER — DEXMEDETOMIDINE HCL IN NACL 200 MCG/50ML IV SOLN
0.0000 ug/kg/h | INTRAVENOUS | Status: DC
Start: 2016-05-29 — End: 2016-05-30
  Administered 2016-05-29: 0.5 ug/kg/h via INTRAVENOUS
  Filled 2016-05-29: qty 50

## 2016-05-29 MED ORDER — MORPHINE SULFATE (PF) 2 MG/ML IV SOLN
1.0000 mg | INTRAVENOUS | Status: DC | PRN
  Filled 2016-05-29 (×5): qty 1

## 2016-05-29 MED ORDER — FENTANYL CITRATE (PF) 100 MCG/2ML IJ SOLN: 25.0000 ug | INTRAMUSCULAR | Status: DC | PRN

## 2016-05-29 MED ORDER — LEVOFLOXACIN IN D5W 750 MG/150ML IV SOLN
750.0000 mg | INTRAVENOUS | Status: AC
  Administered 2016-05-30: 750 mg via INTRAVENOUS
  Filled 2016-05-29: qty 150

## 2016-05-29 MED ORDER — DEXTROSE 5 % IV SOLN
0.0000 ug/min | INTRAVENOUS | Status: DC
  Administered 2016-05-29: 40 ug/min via INTRAVENOUS
  Administered 2016-05-30: 60 ug/min via INTRAVENOUS
  Filled 2016-05-29 (×4): qty 2

## 2016-05-29 MED ORDER — SODIUM CHLORIDE 0.45 % IV SOLN
INTRAVENOUS | Status: DC | PRN
  Administered 2016-05-29: 15:00:00 via INTRAVENOUS

## 2016-05-29 MED ORDER — LACTATED RINGERS IV SOLN: 500.0000 mL | Freq: Once | INTRAVENOUS | Status: DC | PRN

## 2016-05-29 MED ORDER — SODIUM CHLORIDE 0.9% FLUSH
3.0000 mL | Freq: Two times a day (BID) | INTRAVENOUS | Status: DC
  Administered 2016-05-30 – 2016-06-01 (×6): 3 mL via INTRAVENOUS

## 2016-05-29 MED FILL — Potassium Chloride Inj 2 mEq/ML: INTRAVENOUS | Qty: 40 | Status: AC

## 2016-05-29 MED FILL — Magnesium Sulfate Inj 50%: INTRAMUSCULAR | Qty: 10 | Status: AC

## 2016-05-29 MED FILL — Heparin Sodium (Porcine) Inj 1000 Unit/ML: INTRAMUSCULAR | Qty: 30 | Status: AC

## 2016-05-29 SURGICAL SUPPLY — 110 items
BAG DECANTER FOR FLEXI CONT (MISCELLANEOUS) ×4 IMPLANT
BANDAGE ACE 4X5 VEL STRL LF (GAUZE/BANDAGES/DRESSINGS) IMPLANT
BANDAGE ACE 6X5 VEL STRL LF (GAUZE/BANDAGES/DRESSINGS) IMPLANT
BANDAGE ELASTIC 4 VELCRO ST LF (GAUZE/BANDAGES/DRESSINGS) ×4 IMPLANT
BASKET HEART (ORDER IN 25'S) (MISCELLANEOUS) ×1
BASKET HEART (ORDER IN 25S) (MISCELLANEOUS) ×3 IMPLANT
BLADE STERNUM SYSTEM 6 (BLADE) ×4 IMPLANT
BLADE SURG 15 STRL LF DISP TIS (BLADE) ×3 IMPLANT
BLADE SURG 15 STRL SS (BLADE) ×1
BNDG GAUZE ELAST 4 BULKY (GAUZE/BANDAGES/DRESSINGS) ×4 IMPLANT
CANISTER SUCTION 2500CC (MISCELLANEOUS) ×4 IMPLANT
CATH ROBINSON RED A/P 18FR (CATHETERS) ×8 IMPLANT
CATH THORACIC 28FR (CATHETERS) ×8 IMPLANT
CATH THORACIC 36FR (CATHETERS) ×4 IMPLANT
CATH THORACIC 36FR RT ANG (CATHETERS) ×4 IMPLANT
CLIP TI MEDIUM 24 (CLIP) IMPLANT
CLIP TI WIDE RED SMALL 24 (CLIP) ×12 IMPLANT
COVER MAYO STAND STRL (DRAPES) ×4 IMPLANT
CRADLE DONUT ADULT HEAD (MISCELLANEOUS) ×4 IMPLANT
DRAPE CARDIOVASCULAR INCISE (DRAPES) ×1
DRAPE HALF SHEET 40X57 (DRAPES) ×4 IMPLANT
DRAPE PROXIMA HALF (DRAPES) ×4 IMPLANT
DRAPE SLUSH/WARMER DISC (DRAPES) ×4 IMPLANT
DRAPE SRG 135X102X78XABS (DRAPES) ×3 IMPLANT
DRSG COVADERM 4X10 (GAUZE/BANDAGES/DRESSINGS) ×4 IMPLANT
DRSG COVADERM 4X14 (GAUZE/BANDAGES/DRESSINGS) IMPLANT
ELECT CAUTERY BLADE 6.4 (BLADE) ×4 IMPLANT
ELECT REM PT RETURN 9FT ADLT (ELECTROSURGICAL) ×8
ELECTRODE REM PT RTRN 9FT ADLT (ELECTROSURGICAL) ×6 IMPLANT
FELT TEFLON 1X6 (MISCELLANEOUS) ×4 IMPLANT
GAUZE SPONGE 4X4 12PLY STRL (GAUZE/BANDAGES/DRESSINGS) IMPLANT
GEL ULTRASOUND 20GR AQUASONIC (MISCELLANEOUS) ×4 IMPLANT
GLOVE BIO SURGEON STRL SZ 6 (GLOVE) ×8 IMPLANT
GLOVE BIO SURGEON STRL SZ 6.5 (GLOVE) ×8 IMPLANT
GLOVE BIO SURGEON STRL SZ7 (GLOVE) IMPLANT
GLOVE BIO SURGEON STRL SZ7.5 (GLOVE) ×8 IMPLANT
GLOVE BIOGEL PI IND STRL 6 (GLOVE) ×3 IMPLANT
GLOVE BIOGEL PI IND STRL 6.5 (GLOVE) ×9 IMPLANT
GLOVE BIOGEL PI IND STRL 7.0 (GLOVE) ×6 IMPLANT
GLOVE BIOGEL PI INDICATOR 6 (GLOVE) ×1
GLOVE BIOGEL PI INDICATOR 6.5 (GLOVE) ×3
GLOVE BIOGEL PI INDICATOR 7.0 (GLOVE) ×2
GLOVE EUDERMIC 7 POWDERFREE (GLOVE) ×8 IMPLANT
GLOVE ORTHO TXT STRL SZ7.5 (GLOVE) IMPLANT
GOWN STRL REUS W/ TWL LRG LVL3 (GOWN DISPOSABLE) ×21 IMPLANT
GOWN STRL REUS W/ TWL XL LVL3 (GOWN DISPOSABLE) ×6 IMPLANT
GOWN STRL REUS W/TWL LRG LVL3 (GOWN DISPOSABLE) ×7
GOWN STRL REUS W/TWL XL LVL3 (GOWN DISPOSABLE) ×2
HARMONIC SHEARS 14CM COAG (MISCELLANEOUS) ×4 IMPLANT
HEMOSTAT POWDER SURGIFOAM 1G (HEMOSTASIS) ×12 IMPLANT
HEMOSTAT SURGICEL 2X14 (HEMOSTASIS) ×4 IMPLANT
INSERT FOGARTY 61MM (MISCELLANEOUS) IMPLANT
INSERT FOGARTY XLG (MISCELLANEOUS) IMPLANT
KIT BASIN OR (CUSTOM PROCEDURE TRAY) ×4 IMPLANT
KIT CATH CPB BARTLE (MISCELLANEOUS) ×4 IMPLANT
KIT ROOM TURNOVER OR (KITS) ×4 IMPLANT
KIT SUCTION CATH 14FR (SUCTIONS) ×4 IMPLANT
KIT VASOVIEW HEMOPRO VH 3000 (KITS) IMPLANT
MARKER SKIN DUAL TIP RULER LAB (MISCELLANEOUS) ×4 IMPLANT
NS IRRIG 1000ML POUR BTL (IV SOLUTION) ×24 IMPLANT
PACK OPEN HEART (CUSTOM PROCEDURE TRAY) ×4 IMPLANT
PAD ARMBOARD 7.5X6 YLW CONV (MISCELLANEOUS) ×8 IMPLANT
PAD ELECT DEFIB RADIOL ZOLL (MISCELLANEOUS) ×4 IMPLANT
PENCIL BUTTON HOLSTER BLD 10FT (ELECTRODE) ×4 IMPLANT
PUNCH AORTIC ROT 4.0MM RCL 40 (MISCELLANEOUS) ×4 IMPLANT
PUNCH AORTIC ROTATE 4.0MM (MISCELLANEOUS) IMPLANT
PUNCH AORTIC ROTATE 4.5MM 8IN (MISCELLANEOUS) IMPLANT
PUNCH AORTIC ROTATE 5MM 8IN (MISCELLANEOUS) IMPLANT
SET CARDIOPLEGIA MPS 5001102 (MISCELLANEOUS) ×4 IMPLANT
SPONGE GAUZE 4X4 12PLY STER LF (GAUZE/BANDAGES/DRESSINGS) ×8 IMPLANT
SPONGE INTESTINAL PEANUT (DISPOSABLE) IMPLANT
SPONGE LAP 18X18 X RAY DECT (DISPOSABLE) ×8 IMPLANT
SPONGE LAP 4X18 X RAY DECT (DISPOSABLE) ×12 IMPLANT
SUT BONE WAX W31G (SUTURE) ×4 IMPLANT
SUT MNCRL AB 4-0 PS2 18 (SUTURE) ×4 IMPLANT
SUT PROLENE 3 0 SH DA (SUTURE) IMPLANT
SUT PROLENE 3 0 SH1 36 (SUTURE) ×4 IMPLANT
SUT PROLENE 4 0 RB 1 (SUTURE)
SUT PROLENE 4 0 SH DA (SUTURE) IMPLANT
SUT PROLENE 4-0 RB1 .5 CRCL 36 (SUTURE) IMPLANT
SUT PROLENE 5 0 C 1 36 (SUTURE) IMPLANT
SUT PROLENE 6 0 C 1 30 (SUTURE) IMPLANT
SUT PROLENE 7 0 BV 1 (SUTURE) IMPLANT
SUT PROLENE 7 0 BV1 MDA (SUTURE) ×4 IMPLANT
SUT PROLENE 8 0 BV175 6 (SUTURE) ×12 IMPLANT
SUT SILK  1 MH (SUTURE) ×1
SUT SILK 1 MH (SUTURE) ×3 IMPLANT
SUT SILK 2 0 SH (SUTURE) ×4 IMPLANT
SUT STEEL STERNAL CCS#1 18IN (SUTURE) IMPLANT
SUT STEEL SZ 6 DBL 3X14 BALL (SUTURE) ×12 IMPLANT
SUT VIC AB 1 CTX 36 (SUTURE) ×4
SUT VIC AB 1 CTX36XBRD ANBCTR (SUTURE) ×12 IMPLANT
SUT VIC AB 2-0 CT1 27 (SUTURE)
SUT VIC AB 2-0 CT1 TAPERPNT 27 (SUTURE) IMPLANT
SUT VIC AB 2-0 CTX 27 (SUTURE) IMPLANT
SUT VIC AB 3-0 SH 27 (SUTURE) ×2
SUT VIC AB 3-0 SH 27X BRD (SUTURE) ×6 IMPLANT
SUT VIC AB 3-0 X1 27 (SUTURE) ×4 IMPLANT
SUT VICRYL 4-0 PS2 18IN ABS (SUTURE) IMPLANT
SUTURE E-PAK OPEN HEART (SUTURE) ×4 IMPLANT
SYR 50ML SLIP (SYRINGE) ×4 IMPLANT
SYSTEM SAHARA CHEST DRAIN ATS (WOUND CARE) ×8 IMPLANT
TAPE CLOTH SURG 4X10 WHT LF (GAUZE/BANDAGES/DRESSINGS) ×4 IMPLANT
TOWEL OR 17X24 6PK STRL BLUE (TOWEL DISPOSABLE) ×4 IMPLANT
TOWEL OR 17X26 10 PK STRL BLUE (TOWEL DISPOSABLE) ×4 IMPLANT
TRAY FOLEY IC TEMP SENS 16FR (CATHETERS) ×4 IMPLANT
TUBE SUCT INTRACARD DLP 20F (MISCELLANEOUS) ×4 IMPLANT
TUBING INSUFFLATION (TUBING) IMPLANT
UNDERPAD 30X30 (UNDERPADS AND DIAPERS) ×8 IMPLANT
WATER STERILE IRR 1000ML POUR (IV SOLUTION) ×8 IMPLANT

## 2016-05-29 NOTE — Procedures (Signed)
Extubation Procedure Note  Patient Details:   Name: Dennis Winters DOB: January 21, 1961 MRN: [id number]   Airway Documentation:     Evaluation  O2 sats: stable throughout Complications: No apparent complications Patient did tolerate procedure well. Bilateral Breath Sounds: Clear, Diminished   Yes   Positive cuff leak, NIF - 24, VC 610.  Pt placed on [place] 9Lpm with humidity, sat 93% (mouth breather).  No stridor noted. IS 500.  [name] 05/29/2016, 7:21 PM

## 2016-05-29 NOTE — Brief Op Note (Signed)
05/26/2016 - 05/29/2016  1:09 PM  PATIENT:  Dennis Winters  55 y.o. male  PRE-OPERATIVE DIAGNOSIS:  CAD  POST-OPERATIVE DIAGNOSIS:  CAD  PROCEDURE:  Procedure(s): CORONARY ARTERY BYPASS GRAFTING (CABG) x 3 with bilateral internal mammary arteries and left radial artery grafting (N/A) TRANSESOPHAGEAL ECHOCARDIOGRAM (TEE) (N/A) RADIAL ARTERY HARVEST (Left) LIMA-LAD FREE RIMA-RCA LEFT RADIAL-OM  SURGEON:  Surgeon(s) and Role:    * Alleen BorneBryan K Bartle, MD - Primary  PHYSICIAN ASSISTANT: Ryn Peine PA-C;  Doree FudgeNIELLE ZIMMERMAN PA-C  ANESTHESIA:   general  EBL:  Total I/O In: 1600 [I.V.:1600] Out: 575 [Urine:575]  BLOOD ADMINISTERED:none  DRAINS: ROUTINE   LOCAL MEDICATIONS USED:  NONE  SPECIMEN:  No Specimen  DISPOSITION OF SPECIMEN:  N/A  COUNTS:  YES  TOURNIQUET:  * No tourniquets in log *  DICTATION: .Dragon Dictation  PLAN OF CARE: Admit to inpatient   PATIENT DISPOSITION:  ICU - intubated and hemodynamically stable.   Delay start of Pharmacological VTE agent (>24hrs) due to surgical blood loss or risk of bleeding: yes  COMPLICATIONS: NO KNOWN

## 2016-05-29 NOTE — Progress Notes (Signed)
Patient c/o of chest pain with no radiation to any other areas. Patient stated that, "it was in the center of his chest, pain 7 (0-10). EKG was obtained and placed in chart. No changes noted. Morphine given and patient asleep and resting comfortably. Pain has resolved. Around 0210, pt c/o of same pain in the same area 6 (0-10). Morphine given, patient asleep and resting comfortably. Will continue to monitor.

## 2016-05-29 NOTE — OR Nursing (Signed)
1404 Second call made to SICU.

## 2016-05-29 NOTE — Anesthesia Procedure Notes (Signed)
Procedure Name: Intubation Date/Time: 05/29/2016 7:52 AM Performed by: De NurseENNIE, Sharmeka Palmisano E Pre-anesthesia Checklist: Patient identified, Emergency Drugs available, Suction available and Patient being monitored Patient Re-evaluated:Patient Re-evaluated prior to inductionOxygen Delivery Method: Circle System Utilized Preoxygenation: Pre-oxygenation with 100% oxygen Intubation Type: IV induction Ventilation: Mask ventilation without difficulty Laryngoscope Size: Mac and 4 Grade View: Grade I Tube type: Oral Tube size: 8.0 mm Number of attempts: 1 Airway Equipment and Method: Stylet and Oral airway Placement Confirmation: ETT inserted through vocal cords under direct vision,  positive ETCO2 and breath sounds checked- equal and bilateral Secured at: 23 cm Tube secured with: Tape Dental Injury: Teeth and Oropharynx as per pre-operative assessment

## 2016-05-29 NOTE — Progress Notes (Signed)
  Echocardiogram Echocardiogram Transesophageal has been performed.  Leta JunglingCooper, Daphane Odekirk M 05/29/2016, 8:16 AM

## 2016-05-29 NOTE — Anesthesia Procedure Notes (Signed)
Anesthesia Procedure Note     

## 2016-05-29 NOTE — Transfer of Care (Signed)
Immediate Anesthesia Transfer of Care Note  Patient: Dennis Winters  Procedure(s) Performed: Procedure(s): CORONARY ARTERY BYPASS GRAFTING (CABG) x 3 with bilateral internal mammary arteries and left radial artery grafting LIMA-LAD FREE RIMA-RCA LEFT RADIAL-OM (N/A) TRANSESOPHAGEAL ECHOCARDIOGRAM (TEE) (N/A) RADIAL ARTERY HARVEST (Left)  Patient Location: SICU  Anesthesia Type:General  Level of Consciousness: Patient remains intubated per anesthesia plan  Airway & Oxygen Therapy: Patient remains intubated per anesthesia plan and Patient placed on Ventilator (see vital sign flow sheet for setting)  Post-op Assessment: Report given to RN  Post vital signs: Reviewed and stable  Last Vitals:  Vitals:   05/29/16 0724 05/29/16 0725  BP:    Pulse: 64 65  Resp: 15 16  Temp:      Last Pain:  Vitals:   05/29/16 0409  TempSrc: Oral  PainSc:          Complications: No apparent anesthesia complications

## 2016-05-29 NOTE — Anesthesia Postprocedure Evaluation (Signed)
Anesthesia Post Note  Patient: Elita QuickMarshall Beebe  Procedure(s) Performed: Procedure(s) (LRB): CORONARY ARTERY BYPASS GRAFTING (CABG) x 3 with bilateral internal mammary arteries and left radial artery grafting LIMA-LAD FREE RIMA-RCA LEFT RADIAL-OM (N/A) TRANSESOPHAGEAL ECHOCARDIOGRAM (TEE) (N/A) RADIAL ARTERY HARVEST (Left)  Patient location during evaluation: SICU Anesthesia Type: General Level of consciousness: sedated Pain management: pain level controlled Vital Signs Assessment: post-procedure vital signs reviewed and stable Respiratory status: patient remains intubated per anesthesia plan Cardiovascular status: stable Anesthetic complications: no    Last Vitals:  Vitals:   05/29/16 0725 05/29/16 1435  BP:  (!) 103/55  Pulse: 65 90  Resp: 16 12  Temp:      Last Pain:  Vitals:   05/29/16 0409  TempSrc: Oral  PainSc:                  Shakiyah Cirilo DAVID

## 2016-05-29 NOTE — Anesthesia Procedure Notes (Signed)
Anesthesia Procedure Note Procedures: Right IJ Theone MurdochSwan Ganz Catheter Insertion:[id number]: The patient was identified and consent obtained.  TO was performed, and full barrier precautions were used.  The skin was anesthetized with lidocaine-4cc plain with 25g needle.  Once the vein was located with the 22 ga. needle using ultrasound guidance , the wire was inserted into the vein.  The wire location was confirmed with ultrasound.  The tissue was dilated and the 8.5 [place] cordis catheter was carefully inserted. Afterwards Theone MurdochSwan Ganz catheter was inserted. PA catheter at 45cm.  The patient tolerated the procedure well.  CG

## 2016-05-29 NOTE — Progress Notes (Signed)
CT surgery p.m. Rounds  Patient status post CABG 3 earlier today using all arterial grafts Patient extubated complaining of sternal musculoskeletal pain Will add Toradol to pain medication regimen Otherwise doing well, 6 hour labs reviewed and are satisfactory

## 2016-05-29 NOTE — Anesthesia Preprocedure Evaluation (Addendum)
Anesthesia Evaluation  Patient identified by MRN, date of birth, ID band Patient awake    Reviewed: Allergy & Precautions, NPO status , Patient's Chart, lab work & pertinent test results  Airway Mallampati: I  TM Distance: >3 FB Neck ROM: Full    Dental  (+) Teeth Intact, Partial Upper, Poor Dentition, Missing, Chipped, Dental Advisory Given   Pulmonary Current Smoker,    Pulmonary exam normal        Cardiovascular hypertension, Pt. on medications + CAD and + Past MI  Normal cardiovascular exam     Neuro/Psych    GI/Hepatic   Endo/Other    Renal/GU      Musculoskeletal   Abdominal   Peds  Hematology   Anesthesia Other Findings   Reproductive/Obstetrics                            Anesthesia Physical Anesthesia Plan  ASA: III  Anesthesia Plan: General   Post-op Pain Management:    Induction: Intravenous  Airway Management Planned: Oral ETT  Additional Equipment: Arterial line, CVP, PA Cath, TEE and Ultrasound Guidance Line Placement  Intra-op Plan:   Post-operative Plan: Extubation in OR  Informed Consent: I have reviewed the patients History and Physical, chart, labs and discussed the procedure including the risks, benefits and alternatives for the proposed anesthesia with the patient or authorized representative who has indicated his/her understanding and acceptance.     Plan Discussed with: CRNA and Surgeon  Anesthesia Plan Comments:         Anesthesia Quick Evaluation

## 2016-05-29 NOTE — Progress Notes (Signed)
ANTICOAGULATION CONSULT NOTE - Follow Up Consult  Pharmacy Consult for Heparin  Indication: CAD awaiting CABG 12/8 AM  Patient Measurements: Height: 5\' 8"  (172.7 cm) Weight: 206 lb 6.4 oz (93.6 kg) IBW/kg (Calculated) : 68.4 Vital Signs: Temp: 98.5 F (36.9 C) (12/07 2047) Temp Source: Oral (12/07 2047) BP: 131/74 (12/07 2047)  Labs:  Recent Labs  05/26/16 2120  05/26/16 2226 05/27/16 0116 05/27/16 0529 05/27/16 0802 05/27/16 1202 05/28/16 0226 05/28/16 0521 05/29/16 0216  HGB 15.3  --   --   --   --   --   --  15.2  --  15.1  HCT 45.6  --   --   --   --   --   --  45.4  --  45.5  PLT 291  --   --   --   --   --   --  298  --  269  LABPROT  --   --   --   --   --   --   --   --  13.0  --   INR  --   --   --   --   --   --   --   --  0.98  --   HEPARINUNFRC  --   --   --   --   --   --   --   --   --  <0.10*  CREATININE 0.81  --   --   --   --   --   --  0.89  --  0.84  CKTOTAL  --   --  117 99  --   --   --   --   --   --   CKMB  --   --   --  2.4  --   --   --   --   --   --   TROPONINI 0.22*  < >  --  0.22* 0.26* 0.27* 0.25*  --   --   --   < > = values in this interval not displayed.  Estimated Creatinine Clearance: 111.6 mL/min (by C-G formula based on SCr of 0.84 mg/dL).   Assessment: Heparin while awaiting CABG this AM, heparin level is low, no issues per RN.   Goal of Therapy:  Heparin level 0.3-0.7 units/ml Monitor platelets by anticoagulation protocol: Yes   Plan:  -Increase heparin to 1400 units/hr -Heparin will be turned off on call to OR -F/U post-CABG  Dennis Winters, Dennis Winters 05/29/2016,3:29 AM

## 2016-05-29 NOTE — Op Note (Signed)
CARDIOVASCULAR SURGERY OPERATIVE NOTE  05/29/2016  Surgeon:  Alleen BorneBryan K. Damir Leung, MD  First Assistant: Gershon CraneWayne Gold,  PA-C   Preoperative Diagnosis:  Severe multi-vessel coronary artery disease   Postoperative Diagnosis:  Same   Procedure:  1. Median Sternotomy 2. Extracorporeal circulation 3.   Coronary artery bypass grafting x 3   Left internal mammary artery graft to the LAD  Free right internal mammary artery graft to the RCA  Left radial artery graft to the OM   Anesthesia:  General Endotracheal   Clinical History/Surgical Indication:    The patient is a 55 year old smoker with hypertension and hyperlipidemia who reports a several month history or what he thought was heartburn as well as some shortness of breath with exertion. On Monday he started having chest pain radiation to his left shoulder and arm. This occurred while lifting two cases of water out of his truck. He thought he pulled a muscle. The pain improved after lying down and he went to bed. The next morning the pain was resolved but when he went to work on Tuesday it recurred. 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 When he got home the pain got worse and he went to [facility] where he received morphine with resolution of his pain. His ECG was non-acute and troponin 0.26, 0.27. [facility] and underwent cath today showing severe 3-vessel CAD with a large dominant RCA with a focal 99% mid stenosis. The LCX has a large OM that is occluded proximally and fills by collat. The LAD has a 80% stenosis in the mid portion. LV function is preserved with inferior hypokinesis. He has 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 I have personally reviewed his cath films and he has anatomy that is probably suitable for bilateral IMA and a left  radial artery graft. 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 I reviewed the cath films with the patient and his wife and the surgical plan. I discussed the operative procedure with the patient and his wife including alternatives, benefits and risks; including but not limited to bleeding, blood transfusion, infection, stroke, myocardial infarction, graft failure, heart block requiring a permanent pacemaker, organ dysfunction, and death.  Dennis Winters understands and agrees to proceed.  We will schedule surgery for tomorrow morning. I also discussed the importance of complete smoking cessation and maximum cardiac risk factor reduction.   Preparation:  The patient was seen in the preoperative holding area and the correct patient, correct operation were confirmed with the patient after reviewing the medical record and catheterization. The consent was signed by me. Preoperative antibiotics were given. A pulmonary arterial line and radial arterial line were placed by the anesthesia team. The patient was taken back to the operating room and positioned supine on the operating room table. After being placed under general endotracheal anesthesia by the anesthesia team a foley catheter was placed. The neck, chest, abdomen, and both legs were prepped with betadine soap and solution and draped in the usual sterile manner. A surgical time-out was taken and the correct patient and operative procedure were confirmed with the nursing and anesthesia staff.  Left internal mammary harvest:  The left side of the sternum was retracted using the Rultract retractor. The left internal mammary artery was harvested as a pedicle graft. All side branches  were clipped. It was a medium-sized vessel of good quality with excellent blood flow. It was ligated distally and divided. It was sprayed with topical papaverine solution to prevent  vasospasm.  Right internal mammary harvest:  The right side of the sternum was retracted using the Rultract retractor. The right internal mammary artery was harvested as a free graft since it was not long enough to reach the distal RCA beyond the mid-vessel disease. All side branches were clipped. It was a medium-sized vessel of good quality with excellent blood flow. It was ligated proximally and distally and divided. The proximal stump was suture ligated with 2-0 silk. It was flushed with  papaverine solution to prevent vasospasm.  Left radial artery harvest:  The left radial artery was exposed proximal to the wrist through a longitudinal incision over the artery. The artery was occluded with an atraumatic bulldog clamp and a good doppler signal was confirmed in the artery distal to the clamp. The clamp was removed and the remainder of the radial artery exposed though the longitudinal incision. The lateral cutaneous nerve and the superficial branch of the radial nerve were identified and preserved. The branches of the radial artery were divided using the harmonic scalpel. Then the distal end of the artery was clamped and divided and the stump suture ligated with 2-0 silk suture ligature. The proximal end was then clamped and divided and the stump suture ligated with 2-0 silk suture ligature. The artery was flushed with papaverine saline solution and the side branches clipped. Hemostasis was achieved using electrocautery and the incision closed in layers with 3-0 vicryl subcutaneous suture and 3-0 vicryl subcuticular skin closure. The arm was wrapped in Kerlix and gauze and then an ace wrap applied. The arm was returned to the patient's side.    Cardiopulmonary Bypass:  A median sternotomy was performed. The pericardium was opened in the midline. Right ventricular function appeared normal. The ascending aorta was of normal size and had no palpable plaque. There were no contraindications to aortic  cannulation or cross-clamping. The patient was fully systemically heparinized and the ACT was maintained > 400 sec. The proximal aortic arch was cannulated with a 20 F aortic cannula for arterial inflow. Venous cannulation was performed via the right atrial appendage using a two-staged venous cannula. An antegrade cardioplegia/vent cannula was inserted into the mid-ascending aorta. Aortic occlusion was performed with a single cross-clamp. Systemic cooling to 32 degrees Centigrade and topical cooling of the heart with iced saline were used. Hyperkalemic antegrade cold blood cardioplegia was used to induce diastolic arrest and was then given at about 20 minute intervals throughout the period of arrest to maintain myocardial temperature at or below 10 degrees centigrade. A temperature probe was inserted into the interventricular septum and an insulating pad was placed in the pericardium.   Coronary arteries:  The coronary arteries were examined.   LAD:  Medium caliber vessel with diffuse plaque  LCX:  Medium caliber OM that was visualized proximally where there was no disease and then it became intramyocardial throughout the remainder of its length.  RCA:  Diffusely diseased throughout the proximal and mid-portions. There was a soft area near the takeoff of the small PDA that was suitable for grafting.    Grafts:  1. LIMA to the LAD: 1.75 mm. It was sewn end to side using 8-0 prolene continuous suture. 2. Free RIMA to RCA:  2.5 mm. It was sewn end to side using 8-0 prolene continuous suture. 3. Left radial artery  to OM:  1.75 mm. It was sewn end to side using 8-0 prolene continuous suture.   The proximal radial and RIMA graft anastomoses were performed to the mid-ascending aorta using continuous 7-0 prolene suture. Graft markers were placed around the proximal anastomoses.   Completion:  The patient was rewarmed to 37 degrees Centigrade. The clamp was removed from the LIMA pedicle and there  was rapid warming of the septum and return of ventricular fibrillation. The crossclamp was removed with a time of 88 minutes. There was spontaneous return of sinus rhythm. The distal and proximal anastomoses were checked for hemostasis. The position of the grafts was satisfactory. Two temporary epicardial pacing wires were placed on the right atrium and two on the right ventricle. The patient was weaned from CPB without difficulty on no inotropes. CPB time was 109 minutes. Cardiac output was 6 LPM. Heparin was fully reversed with protamine and the aortic and venous cannulas removed. Hemostasis was achieved. Mediastinal and bilateral pleural drainage tubes were placed. The sternum was closed with double #6 stainless steel wires. The fascia was closed with continuous # 1 vicryl suture. The subcutaneous tissue was closed with 2-0 vicryl continuous suture. The skin was closed with 3-0 vicryl subcuticular suture. All sponge, needle, and instrument counts were reported correct at the end of the case. Dry sterile dressings were placed over the incisions and around the chest tubes which were connected to pleurevac suction. The patient was then transported to the surgical intensive care unit in critical but stable condition.

## 2016-05-29 NOTE — OR Nursing (Signed)
1333 First call made to SICU.

## 2016-05-30 ENCOUNTER — Inpatient Hospital Stay (HOSPITAL_COMMUNITY): Payer: 59

## 2016-05-30 LAB — CBC
HEMATOCRIT: 39.9 % (ref 39.0–52.0)
HEMATOCRIT: 36.6 % — AB (ref 39.0–52.0)
HEMOGLOBIN: 13.5 g/dL (ref 13.0–17.0)
Hemoglobin: 12.3 g/dL — ABNORMAL LOW (ref 13.0–17.0)
MCH: 32.4 pg (ref 26.0–34.0)
MCH: 32.5 pg (ref 26.0–34.0)
MCHC: 33.8 g/dL (ref 30.0–36.0)
MCHC: 33.6 g/dL (ref 30.0–36.0)
MCV: 95.7 fL (ref 78.0–100.0)
MCV: 96.8 fL (ref 78.0–100.0)
PLATELETS: 181 10*3/uL (ref 150–400)
Platelets: 206 10*3/uL (ref 150–400)
RBC: 4.17 MIL/uL — AB (ref 4.22–5.81)
RBC: 3.78 MIL/uL — ABNORMAL LOW (ref 4.22–5.81)
RDW: 13.3 % (ref 11.5–15.5)
RDW: 13.4 % (ref 11.5–15.5)
WBC: 15 10*3/uL — ABNORMAL HIGH (ref 4.0–10.5)
WBC: 13.9 10*3/uL — ABNORMAL HIGH (ref 4.0–10.5)

## 2016-05-30 LAB — GLUCOSE, CAPILLARY
GLUCOSE-CAPILLARY: 102 mg/dL — AB (ref 65–99)
GLUCOSE-CAPILLARY: 113 mg/dL — AB (ref 65–99)
GLUCOSE-CAPILLARY: 122 mg/dL — AB (ref 65–99)
GLUCOSE-CAPILLARY: 127 mg/dL — AB (ref 65–99)
GLUCOSE-CAPILLARY: 131 mg/dL — AB (ref 65–99)
GLUCOSE-CAPILLARY: 86 mg/dL (ref 65–99)
GLUCOSE-CAPILLARY: 92 mg/dL (ref 65–99)
Glucose-Capillary: 109 mg/dL — ABNORMAL HIGH (ref 65–99)
Glucose-Capillary: 113 mg/dL — ABNORMAL HIGH (ref 65–99)
Glucose-Capillary: 114 mg/dL — ABNORMAL HIGH (ref 65–99)
Glucose-Capillary: 114 mg/dL — ABNORMAL HIGH (ref 65–99)
Glucose-Capillary: 118 mg/dL — ABNORMAL HIGH (ref 65–99)
Glucose-Capillary: 118 mg/dL — ABNORMAL HIGH (ref 65–99)
Glucose-Capillary: 125 mg/dL — ABNORMAL HIGH (ref 65–99)
Glucose-Capillary: 131 mg/dL — ABNORMAL HIGH (ref 65–99)
Glucose-Capillary: 131 mg/dL — ABNORMAL HIGH (ref 65–99)

## 2016-05-30 LAB — CREATININE, SERUM
Creatinine, Ser: 0.83 mg/dL (ref 0.61–1.24)
GFR calc Af Amer: >60 mL/min (ref >60)

## 2016-05-30 LAB — BASIC METABOLIC PANEL
Anion gap: 6 (ref 5–15)
BUN: 9 mg/dL (ref 6–20)
CHLORIDE: 104 mmol/L (ref 101–111)
CO2: 25 mmol/L (ref 22–32)
CREATININE: 0.73 mg/dL (ref 0.61–1.24)
Calcium: 8 mg/dL — ABNORMAL LOW (ref 8.9–10.3)
GFR calc non Af Amer: >60 mL/min (ref >60)
Glucose, Bld: 120 mg/dL — ABNORMAL HIGH (ref 65–99)
POTASSIUM: 4.4 mmol/L (ref 3.5–5.1)
SODIUM: 135 mmol/L (ref 135–145)

## 2016-05-30 LAB — POCT I-STAT, CHEM 8
BUN: 17 mg/dL (ref 6–20)
CALCIUM ION: 1.18 mmol/L (ref 1.15–1.40)
CHLORIDE: 99 mmol/L — AB (ref 101–111)
Creatinine, Ser: 0.8 mg/dL (ref 0.61–1.24)
GLUCOSE: 102 mg/dL — AB (ref 65–99)
HCT: 36 % — ABNORMAL LOW (ref 39.0–52.0)
Hemoglobin: 12.2 g/dL — ABNORMAL LOW (ref 13.0–17.0)
Potassium: 4.3 mmol/L (ref 3.5–5.1)
SODIUM: 138 mmol/L (ref 135–145)
TCO2: 27 mmol/L (ref 0–100)

## 2016-05-30 LAB — MAGNESIUM
MAGNESIUM: 2.4 mg/dL (ref 1.7–2.4)
MAGNESIUM: 2.2 mg/dL (ref 1.7–2.4)

## 2016-05-30 MED ORDER — ALBUTEROL SULFATE (2.5 MG/3ML) 0.083% IN NEBU
INHALATION_SOLUTION | RESPIRATORY_TRACT | Status: AC
  Administered 2016-05-30: 2.5 mg
  Filled 2016-05-30: qty 3

## 2016-05-30 MED ORDER — FUROSEMIDE 10 MG/ML IJ SOLN
20.0000 mg | Freq: Two times a day (BID) | INTRAMUSCULAR | Status: DC
  Administered 2016-05-30 – 2016-05-31 (×2): 20 mg via INTRAVENOUS
  Filled 2016-05-30 (×2): qty 2

## 2016-05-30 MED ORDER — FENTANYL CITRATE (PF) 100 MCG/2ML IJ SOLN
75.0000 ug | INTRAMUSCULAR | Status: DC | PRN
Start: 2016-05-30 — End: 2016-05-31
  Administered 2016-05-30: 75 ug via INTRAVENOUS
  Filled 2016-05-30: qty 2

## 2016-05-30 MED ORDER — KETOROLAC TROMETHAMINE 15 MG/ML IJ SOLN
15.0000 mg | Freq: Four times a day (QID) | INTRAMUSCULAR | Status: DC
  Administered 2016-05-30 – 2016-05-31 (×4): 15 mg via INTRAVENOUS
  Filled 2016-05-30 (×5): qty 1

## 2016-05-30 MED ORDER — INSULIN ASPART 100 UNIT/ML ~~LOC~~ SOLN: 0.0000 [IU] | SUBCUTANEOUS | Status: DC

## 2016-05-30 MED ORDER — INSULIN DETEMIR 100 UNIT/ML ~~LOC~~ SOLN
8.0000 [IU] | Freq: Two times a day (BID) | SUBCUTANEOUS | Status: DC
  Administered 2016-05-30: 8 [IU] via SUBCUTANEOUS
  Filled 2016-05-30 (×4): qty 0.08

## 2016-05-30 MED ORDER — FENTANYL CITRATE (PF) 100 MCG/2ML IJ SOLN: 50.0000 ug | INTRAMUSCULAR | Status: DC | PRN

## 2016-05-30 MED ORDER — ISOSORBIDE MONONITRATE ER 30 MG PO TB24
15.0000 mg | ORAL_TABLET | Freq: Every day | ORAL | Status: DC
  Administered 2016-05-30 – 2016-06-04 (×6): 15 mg via ORAL
  Filled 2016-05-30 (×6): qty 1

## 2016-05-30 NOTE — Progress Notes (Signed)
Patient dangled and stood at bedside at 0500; patient tolerated poorly and SPO2 desat in the mid 80s.  Escalated to NRB @ 15 lpm; assessed and noted inspiratory wheezes. Called RT and given one time albuterol treatment; Patient SPO2 in the mid 90s and holding; called CXR to expedite.  Ivery QualeSean Agata Lucente RN

## 2016-05-30 NOTE — Progress Notes (Signed)
1 Day Post-Op Procedure(s) (LRB): CORONARY ARTERY BYPASS GRAFTING (CABG) x 3 with bilateral internal mammary arteries and left radial artery grafting LIMA-LAD FREE RIMA-RCA LEFT RADIAL-OM (N/A) TRANSESOPHAGEAL ECHOCARDIOGRAM (TEE) (N/A) RADIAL ARTERY HARVEST (Left) Subjective: Pain after BIMA Hemodynamics stable Objective: Vital signs in last 24 hours: Temp:  [98.2 F (36.8 C)-100.6 F (38.1 C)] 99 F (37.2 C) (12/09 0945) Pulse Rate:  [70-90] 80 (12/09 0945) Cardiac Rhythm: Atrial paced (12/09 0800) Resp:  [12-38] 30 (12/09 0945) BP: (77-117)/(55-78) 104/67 (12/09 0900) SpO2:  [87 %-99 %] 90 % (12/09 0945) Arterial Line BP: (86-144)/(50-80) 123/68 (12/09 0945) FiO2 (%):  [40 %-50 %] 40 % (12/08 1641) Weight:  [215 lb 13.3 oz (97.9 kg)] 215 lb 13.3 oz (97.9 kg) (12/09 0500)  Hemodynamic parameters for last 24 hours: PAP: (22-47)/(7-30) 43/25 CO:  [4.1 L/min-6.1 L/min] 5.6 L/min CI:  [2 L/min/m2-3 L/min/m2] 2.7 L/min/m2  Intake/Output from previous day: 12/08 0701 - 12/09 0700 In: 4826.4 [I.V.:3751.4; Blood:200; NG/GT:30; IV Piggyback:815] Out: 2955 [Urine:1815; Blood:400; Chest Tube:740] Intake/Output this shift: Total I/O In: 63.3 [I.V.:63.3] Out: 86 [Urine:36; Chest Tube:50]       Exam    General- alert and comfortable   Lungs- clear without rales, wheezes   Cor- regular rate and rhythm, no murmur , gallop   Abdomen- soft, non-tender   Extremities - warm, non-tender, minimal edema   Neuro- oriented, appropriate, no focal weakness   Lab Results:  Recent Labs  05/29/16 1945 05/30/16 0430  WBC 19.4* 15.0*  HGB 14.6 13.5  HCT 43.3 39.9  PLT 234 206   BMET:  Recent Labs  05/29/16 0216  05/29/16 1942 05/29/16 1945 05/30/16 0430  NA 141  < > 137  --  135  K 3.7  < > 5.4*  --  4.4  CL 107  < > 107  --  104  CO2 26  --   --   --  25  GLUCOSE 118*  < > 125*  --  120*  BUN <5*  < > 8  --  9  CREATININE 0.84  < > 0.80 0.82 0.73  CALCIUM 8.6*  --   --   --   8.0*  < > = values in this interval not displayed.  PT/INR:  Recent Labs  05/29/16 1445  LABPROT 17.1*  INR 1.38   ABG    Component Value Date/Time   PHART 7.377 05/29/2016 1834   HCO3 26.3 05/29/2016 1834   TCO2 24 05/29/2016 1942   ACIDBASEDEF 1.0 05/29/2016 1340   O2SAT 92.0 05/29/2016 1834   CBG (last 3)   Recent Labs  05/30/16 0557 05/30/16 0651 05/30/16 0922  GLUCAP 131* 118* 114*    Assessment/Plan: S/P Procedure(s) (LRB): CORONARY ARTERY BYPASS GRAFTING (CABG) x 3 with bilateral internal mammary arteries and left radial artery grafting LIMA-LAD FREE RIMA-RCA LEFT RADIAL-OM (N/A) TRANSESOPHAGEAL ECHOCARDIOGRAM (TEE) (N/A) RADIAL ARTERY HARVEST (Left) Mobilize Diuresis Diabetes control See progression orders imdur for L radial harvest  LOS: 2 days    Kathlee Nationseter Van Trigt III 05/30/2016

## 2016-05-31 ENCOUNTER — Inpatient Hospital Stay (HOSPITAL_COMMUNITY): Payer: 59

## 2016-05-31 LAB — GLUCOSE, CAPILLARY
GLUCOSE-CAPILLARY: 119 mg/dL — AB (ref 65–99)
Glucose-Capillary: 90 mg/dL (ref 65–99)
Glucose-Capillary: 111 mg/dL — ABNORMAL HIGH (ref 65–99)
Glucose-Capillary: 115 mg/dL — ABNORMAL HIGH (ref 65–99)
Glucose-Capillary: 109 mg/dL — ABNORMAL HIGH (ref 65–99)

## 2016-05-31 LAB — CBC
HCT: 33.4 % — ABNORMAL LOW (ref 39.0–52.0)
Hemoglobin: 10.9 g/dL — ABNORMAL LOW (ref 13.0–17.0)
MCH: 31.8 pg (ref 26.0–34.0)
MCHC: 32.6 g/dL (ref 30.0–36.0)
MCV: 97.4 fL (ref 78.0–100.0)
Platelets: 156 10*3/uL (ref 150–400)
RBC: 3.43 MIL/uL — ABNORMAL LOW (ref 4.22–5.81)
RDW: 13.5 % (ref 11.5–15.5)
WBC: 11 10*3/uL — ABNORMAL HIGH (ref 4.0–10.5)

## 2016-05-31 LAB — BASIC METABOLIC PANEL
Anion gap: 8 (ref 5–15)
BUN: 16 mg/dL (ref 6–20)
CO2: 28 mmol/L (ref 22–32)
Calcium: 8 mg/dL — ABNORMAL LOW (ref 8.9–10.3)
Chloride: 100 mmol/L — ABNORMAL LOW (ref 101–111)
Creatinine, Ser: 0.89 mg/dL (ref 0.61–1.24)
GFR calc Af Amer: >60 mL/min (ref >60)
GFR calc non Af Amer: >60 mL/min (ref >60)
Glucose, Bld: 92 mg/dL (ref 65–99)
Potassium: 3.9 mmol/L (ref 3.5–5.1)
Sodium: 136 mmol/L (ref 135–145)

## 2016-05-31 MED ORDER — SODIUM CHLORIDE 0.9 % IV SOLN: 250.0000 mL | INTRAVENOUS | Status: DC | PRN

## 2016-05-31 MED ORDER — POTASSIUM CHLORIDE CRYS ER 20 MEQ PO TBCR
40.0000 meq | EXTENDED_RELEASE_TABLET | Freq: Once | ORAL | Status: AC
  Administered 2016-05-31: 40 meq via ORAL
  Filled 2016-05-31: qty 2

## 2016-05-31 MED ORDER — POTASSIUM CHLORIDE CRYS ER 20 MEQ PO TBCR
20.0000 meq | EXTENDED_RELEASE_TABLET | Freq: Every day | ORAL | Status: DC
  Filled 2016-05-31: qty 1

## 2016-05-31 MED ORDER — FUROSEMIDE 40 MG PO TABS
40.0000 mg | ORAL_TABLET | Freq: Every day | ORAL | Status: DC
  Administered 2016-06-01 – 2016-06-04 (×4): 40 mg via ORAL
  Filled 2016-05-31 (×4): qty 1

## 2016-05-31 MED ORDER — OMEGA-3-ACID ETHYL ESTERS 1 G PO CAPS
1.0000 g | ORAL_CAPSULE | Freq: Two times a day (BID) | ORAL | Status: DC
  Administered 2016-05-31 – 2016-06-04 (×9): 1 g via ORAL
  Filled 2016-05-31 (×9): qty 1

## 2016-05-31 MED ORDER — INSULIN ASPART 100 UNIT/ML ~~LOC~~ SOLN: 0.0000 [IU] | Freq: Three times a day (TID) | SUBCUTANEOUS | Status: DC

## 2016-05-31 MED ORDER — SODIUM CHLORIDE 0.9% FLUSH
3.0000 mL | Freq: Two times a day (BID) | INTRAVENOUS | Status: DC
  Administered 2016-05-31 – 2016-06-03 (×6): 3 mL via INTRAVENOUS

## 2016-05-31 MED ORDER — MAGNESIUM HYDROXIDE 400 MG/5ML PO SUSP: 30.0000 mL | Freq: Every day | ORAL | Status: DC | PRN

## 2016-05-31 MED ORDER — SODIUM CHLORIDE 0.9% FLUSH: 3.0000 mL | INTRAVENOUS | Status: DC | PRN

## 2016-05-31 MED ORDER — MOVING RIGHT ALONG BOOK
Freq: Once | Status: AC
  Administered 2016-05-31: 11:00:00
  Filled 2016-05-31: qty 1

## 2016-05-31 MED ORDER — MOMETASONE FURO-FORMOTEROL FUM 200-5 MCG/ACT IN AERO
2.0000 | INHALATION_SPRAY | Freq: Two times a day (BID) | RESPIRATORY_TRACT | Status: DC
  Administered 2016-05-31 – 2016-06-04 (×8): 2 via RESPIRATORY_TRACT
  Filled 2016-05-31: qty 8.8

## 2016-05-31 NOTE — Progress Notes (Signed)
2 Days Post-Op Procedure(s) (LRB): CORONARY ARTERY BYPASS GRAFTING (CABG) x 3 with bilateral internal mammary arteries and left radial artery grafting LIMA-LAD FREE RIMA-RCA LEFT RADIAL-OM (N/A) TRANSESOPHAGEAL ECHOCARDIOGRAM (TEE) (N/A) RADIAL ARTERY HARVEST (Left) Subjective: Stable on 5 L O2 nsr CXR low lung volumes Walked in hall  Objective: Vital signs in last 24 hours: Temp:  [97.2 F (36.2 C)-99 F (37.2 C)] 98.4 F (36.9 C) (12/10 0800) Pulse Rate:  [68-81] 78 (12/10 1030) Cardiac Rhythm: Normal sinus rhythm (12/10 0800) Resp:  [11-31] 15 (12/10 1000) BP: (88-121)/(55-76) 88/76 (12/10 1030) SpO2:  [91 %-96 %] 93 % (12/10 1000) Arterial Line BP: (107-113)/(49-65) 107/55 (12/09 1300) Weight:  [214 lb 3.2 oz (97.2 kg)] 214 lb 3.2 oz (97.2 kg) (12/10 0600)  Hemodynamic parameters for last 24 hours: PAP: (25)/(6) 25/6  Intake/Output from previous day: 12/09 0701 - 12/10 0700 In: 1380.9 [P.O.:690; I.V.:690.9] Out: 791 [Urine:741; Chest Tube:50] Intake/Output this shift: Total I/O In: 200 [P.O.:120; I.V.:80] Out: 300 [Urine:300]       Exam    General- alert and comfortable   Lungs- clear without rales, wheezes   Cor- regular rate and rhythm, no murmur , gallop   Abdomen- soft, non-tender   Extremities - warm, non-tender, minimal edema   Neuro- oriented, appropriate, no focal weakness   Lab Results:  Recent Labs  05/30/16 1627 05/30/16 1631 05/31/16 0454  WBC 13.9*  --  11.0*  HGB 12.3* 12.2* 10.9*  HCT 36.6* 36.0* 33.4*  PLT 181  --  156   BMET:  Recent Labs  05/30/16 0430  05/30/16 1631 05/31/16 0454  NA 135  --  138 136  K 4.4  --  4.3 3.9  CL 104  --  99* 100*  CO2 25  --   --  28  GLUCOSE 120*  --  102* 92  BUN 9  --  17 16  CREATININE 0.73  < > 0.80 0.89  CALCIUM 8.0*  --   --  8.0*  < > = values in this interval not displayed.  PT/INR:  Recent Labs  05/29/16 1445  LABPROT 17.1*  INR 1.38   ABG    Component Value Date/Time   PHART 7.377 05/29/2016 1834   HCO3 26.3 05/29/2016 1834   TCO2 27 05/30/2016 1631   ACIDBASEDEF 1.0 05/29/2016 1340   O2SAT 92.0 05/29/2016 1834   CBG (last 3)   Recent Labs  05/30/16 2352 05/31/16 0421 05/31/16 0754  GLUCAP 86 90 111*    Assessment/Plan: S/P Procedure(s) (LRB): CORONARY ARTERY BYPASS GRAFTING (CABG) x 3 with bilateral internal mammary arteries and left radial artery grafting LIMA-LAD FREE RIMA-RCA LEFT RADIAL-OM (N/A) TRANSESOPHAGEAL ECHOCARDIOGRAM (TEE) (N/A) RADIAL ARTERY HARVEST (Left) Mobilize Diuresis Diabetes control d/c tubes/lines Plan for transfer to step-down: see transfer orders imdur for LRA graft   LOS: 3 days    Dennis Winters 05/31/2016

## 2016-05-31 NOTE — Progress Notes (Signed)
05/31/2016 1415 Received pt from 2S to room 2W23.  Pt is A&O, some c/o pain.  Tele monitor applied and CCMD notified.  Oriented to room, call light and bed.  Call bell in reach, family at bedside. Kathryne HitchAllen, [place] Nordquist C

## 2016-05-31 NOTE — Progress Notes (Signed)
Patient transferred to 2w23. Patient ambulated to new room on 4L [place]. Patient tolerated well, did desat into 88-89% toward end and oxygen bumped up to 5L. Patient assisted into bed with receiving RN. Oxygen subsequently decreased to 4L. SCDs applied to BLE. Family at bedside.

## 2016-06-01 ENCOUNTER — Encounter (HOSPITAL_COMMUNITY): Payer: Self-pay | Admitting: General Practice

## 2016-06-01 ENCOUNTER — Inpatient Hospital Stay (HOSPITAL_COMMUNITY): Payer: 59

## 2016-06-01 LAB — GLUCOSE, CAPILLARY: GLUCOSE-CAPILLARY: 103 mg/dL — AB (ref 65–99)

## 2016-06-01 LAB — BASIC METABOLIC PANEL
Anion gap: 12 (ref 5–15)
BUN: 23 mg/dL — ABNORMAL HIGH (ref 6–20)
CO2: 23 mmol/L (ref 22–32)
Calcium: 8.2 mg/dL — ABNORMAL LOW (ref 8.9–10.3)
Chloride: 101 mmol/L (ref 101–111)
Creatinine, Ser: 0.85 mg/dL (ref 0.61–1.24)
GFR calc Af Amer: >60 mL/min (ref >60)
GFR calc non Af Amer: >60 mL/min (ref >60)
Glucose, Bld: 108 mg/dL — ABNORMAL HIGH (ref 65–99)
Potassium: 3.9 mmol/L (ref 3.5–5.1)
Sodium: 136 mmol/L (ref 135–145)

## 2016-06-01 LAB — CBC
HCT: 33.1 % — ABNORMAL LOW (ref 39.0–52.0)
Hemoglobin: 10.8 g/dL — ABNORMAL LOW (ref 13.0–17.0)
MCH: 31.8 pg (ref 26.0–34.0)
MCHC: 32.6 g/dL (ref 30.0–36.0)
MCV: 97.4 fL (ref 78.0–100.0)
Platelets: 175 10*3/uL (ref 150–400)
RBC: 3.4 MIL/uL — ABNORMAL LOW (ref 4.22–5.81)
RDW: 13.6 % (ref 11.5–15.5)
WBC: 10.4 10*3/uL (ref 4.0–10.5)

## 2016-06-01 MED ORDER — POTASSIUM CHLORIDE CRYS ER 20 MEQ PO TBCR
20.0000 meq | EXTENDED_RELEASE_TABLET | Freq: Two times a day (BID) | ORAL | Status: DC
Start: 2016-06-01 — End: 2016-06-04
  Administered 2016-06-01 – 2016-06-04 (×6): 20 meq via ORAL
  Filled 2016-06-01 (×6): qty 1

## 2016-06-01 MED FILL — Electrolyte-R (PH 7.4) Solution: INTRAVENOUS | Qty: 2000 | Status: AC

## 2016-06-01 MED FILL — Sodium Bicarbonate IV Soln 8.4%: INTRAVENOUS | Qty: 50 | Status: AC

## 2016-06-01 MED FILL — Mannitol IV Soln 20%: INTRAVENOUS | Qty: 500 | Status: AC

## 2016-06-01 MED FILL — Sodium Chloride IV Soln 0.9%: INTRAVENOUS | Qty: 1000 | Status: AC

## 2016-06-01 MED FILL — Heparin Sodium (Porcine) Inj 1000 Unit/ML: INTRAMUSCULAR | Qty: 20 | Status: AC

## 2016-06-01 MED FILL — Lidocaine HCl IV Inj 20 MG/ML: INTRAVENOUS | Qty: 5 | Status: AC

## 2016-06-01 NOTE — Discharge Summary (Signed)
Physician Discharge Summary  Patient ID: Dennis Winters MRN: [id number] DOB/AGE: 54-Jul-1962 54 y.o.  Admit date: 05/26/2016 Discharge date: 06/04/2016  Admission Diagnoses:  Patient Active Problem List   Diagnosis Date Noted  . 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 NSTEMI (non-ST elevated myocardial infarction) (HCC)   . Chest pain 05/27/2016  . High cholesterol   . Essential hypertension   . Calculus of gallbladder with acute cholecystitis, without mention of obstruction 04/19/2013    Discharge Diagnoses:  Principal Problem:   Chest pain Active Problems:   High cholesterol   Essential hypertension   NSTEMI (non-ST elevated myocardial infarction) (HCC)   S/P CABG x 3   Discharged Condition: good  HPI: The patient is a 55 year old smoker with hypertension and hyperlipidemia who reports a several month history or what he thought was heartburn as well as some shortness of breath with exertion. On Monday he started having chest pain radiation to his left shoulder and arm. This occurred while lifting two cases of water out of his truck. He thought he pulled a muscle. The pain improved after lying down and he went to bed. The next morning the pain was resolved but when he went to work on Tuesday it recurred. 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 When he got home the pain got worse and he went to [facility] where he received morphine with resolution of his pain. His ECG was non-acute and troponin 0.26, 0.27. [facility] and underwent cath today showing severe 3-vessel CAD with a large dominant RCA with a focal 99% mid stenosis. The LCX has a large OM that is occluded proximally and fills by collat. The LAD has a 80% stenosis in the mid portion. LV function is preserved with inferior hypokinesis.   Hospital Course:  On 05/29/2016 [name] underwent a coronary bypass grafting 3 by Dr. Rexanne ManoBrian Bartle. He tolerated the procedure well and was  transferred to ICU. He was extubated in a timely manner. We initiated diuretic regimen for fluid overload. We started indoor for his left radial harvest. He remained on 5 L nasal cannula postop day 2. His chest tubes and lines were discontinued. He continued to progress and ambulating around the unit. He was transferred to the telemetry unit for continued care. Postop day 3 we continued to wean his supplemental oxygen support.  He continues to have atelectasis on CXR and was educated on the importance of continued use of IS at discharge. He remained in normal sinus rhythm. We removed his pacing wires. We continued his diuretic regimen and he is responding appropriately.  He developed right wrist pain.  This was at the location of his arterial line.  Exam was benign.  He is ambulating independently.  He is maintaining oxygen saturations off oxygen.  He medically stable for discharge home today.  Consults: cardiology  Significant Diagnostic Studies: CLINICAL DATA:  Coronary artery disease, history of myocardial infarction.  EXAM: CHEST  2 VIEW  COMPARISON:  05/31/2016 and CT chest 05/26/2016.  FINDINGS: Trachea is midline. Heart size stable. Epicardial pacer wires are in place. Right IJ catheter sheath has been removed. 7 intact sternotomy wires, stable in position. There is mild patchy mixed interstitial and airspace opacification, similar to 05/31/2016 but new from 05/26/2016. Lungs are low in volume. There may be tiny bilateral pleural effusions.  IMPRESSION: 1. Mixed interstitial and airspace opacification, stable from 05/31/2016. Findings may be due  to edema or pneumonia. 2. Possible tiny bilateral pleural effusions.   Electronically Signed   By: Leanna Battles M.D.   On: 06/01/2016 07:25   Treatments:  05/29/2016  Surgeon:  Alleen Borne, MD  First Assistant: Gershon Crane,  PA-C   Preoperative Diagnosis:  Severe multi-vessel coronary artery  disease   Postoperative Diagnosis:  Same   Procedure:  1. Median Sternotomy 2. Extracorporeal circulation 3.   Coronary artery bypass grafting x 3   Left internal mammary artery graft to the LAD  Free right internal mammary artery graft to the RCA  Left radial artery graft to the OM   Anesthesia:  General Endotracheal   Clinical History/Surgical Indication:   The patient is a 55 year old smoker with hypertension and hyperlipidemia who reports a several month history or what he thought was heartburn as well as some shortness of breath with exertion. On Monday he started having chest pain radiation to his left shoulder and arm. This occurred while lifting two cases of water out of his truck. He thought he pulled a muscle. The pain improved after lying down and he went to bed. The next morning the pain was resolved but when he went to work on Tuesday it recurred. 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 When he got home the pain got worse and he went to [facility] where he received morphine with resolution of his pain. His ECG was non-acute and troponin 0.26, 0.27. He was transferred to [facility] and underwent cath today showing severe 3-vessel CAD with a large dominant RCA with a focal 99% mid stenosis. The LCX has a large OM that is occluded proximally and fills by collat. The LAD has a 80% stenosis in the mid portion. LV function is preserved with inferior hypokinesis. He has 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 I have personally reviewed his cath films and he has anatomy that is probably suitable for bilateral IMA and a left radial artery graft. 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 I reviewed the cath films with the patient and his wife and the surgical plan. I discussed the operative procedure with  the patient and his wifeincluding alternatives, benefits and risks; including but not limited to bleeding, blood transfusion, infection, stroke, myocardial infarction, graft failure, heart block requiring a permanent pacemaker, organ dysfunction, and death. Bowen Carterunderstands and agrees to proceed. We will schedule surgery for tomorrow morning. I also discussed the importance of complete smoking cessation and maximum cardiac risk factor reduction.  Disposition: 01-Home or Self Care   Discharge medications:     Medication List    STOP taking these medications   ibuprofen 200 MG tablet Commonly known as:  ADVIL,MOTRIN   lisinopril 10 MG tablet Commonly known as:  PRINIVIL,ZESTRIL     TAKE these medications   aspirin 325 MG EC tablet Take 1 tablet (325 mg total) by mouth daily. What changed:  medication strength  how much to take   furosemide 40 MG tablet Commonly known as:  LASIX Take 1 tablet (40 mg total) by mouth daily. For 7 Days   isosorbide mononitrate 30 MG 24 hr tablet Commonly known as:  IMDUR Take 0.5 tablets (15 mg total) by mouth daily.   metoprolol tartrate 25 MG tablet Commonly known as:  LOPRESSOR Take 0.5 tablets (12.5  mg total) by mouth 2 (two) times daily.   mometasone-formoterol 200-5 MCG/ACT Aero Commonly known as:  DULERA Inhale 2 puffs into the lungs 2 (two) times daily.   multivitamin with minerals Tabs tablet Take 1 tablet by mouth daily.   niacin 500 MG tablet Take 500 mg by mouth daily.   omega-3 acid ethyl esters 1 g capsule Commonly known as:  LOVAZA Take 1 capsule (1 g total) by mouth 2 (two) times daily.   oxyCODONE 5 MG immediate release tablet Commonly known as:  Oxy IR/ROXICODONE Take 1-2 tablets (5-10 mg total) by mouth every 3 (three) hours as needed for severe pain.   pantoprazole 40 MG tablet Commonly known as:  PROTONIX Take 1 tablet (40 mg total) by mouth daily.   potassium chloride SA 20 MEQ  tablet Commonly known as:  K-DUR,KLOR-CON Take 1 tablet (20 mEq total) by mouth 2 (two) times daily. For 7 Days   simvastatin 40 MG tablet Commonly known as:  ZOCOR Take 40 mg by mouth daily.      Follow-up Information    Erasmo DownerStrader, Lindsey F, NP. Schedule an appointment as soon as possible for a visit.   Specialty:  Nurse Practitioner Contact information: [address] [place] [phone]        Debbora PrestoMAGICK-MYERS, ISKRA, MD Follow up.   Specialty:  Internal Medicine Why:  call me with questions [phone] Contact information: [address] [place] [address] [phone]        Alleen BorneBryan K Bartle, MD Follow up.   Specialty:  Cardiothoracic Surgery Why:  Your appointment is on 07/01/2016 at 11:30am. Please arrive at 11:00am [facility] which is located on the first floor of our building.  Contact information: [address] [place] [address] [phone]        nursing appointment for suture removal Follow up.   Why:  Your suture removal appointment is on 06/10/2016 on 10:30am.          The patient has been discharged on:   1.Beta Blocker:  Yes [ x  ]                              No   [   ]                              If No, reason:  2.Ace Inhibitor/ARB: Yes [   ]                                     No  [  x  ]                                     If No, reason: titration of beta blockers  3.Statin:   Yes [   ]                  No  [ x  ]                  If No, reason: Allergy  4.Marlowe KaysEcasaValentino Hue:  Yes  [  x ]  No   [   ]                  If No, reason:   Signed: Mercedes Fort 06/04/2016, 8:06 AM

## 2016-06-01 NOTE — Progress Notes (Addendum)
      [address]       [place] [id number]             [phone]      3 Days Post-Op Procedure(s) (LRB): CORONARY ARTERY BYPASS GRAFTING (CABG) x 3 with bilateral internal mammary arteries and left radial artery grafting LIMA-LAD FREE RIMA-RCA LEFT RADIAL-OM (N/A) TRANSESOPHAGEAL ECHOCARDIOGRAM (TEE) (N/A) RADIAL ARTERY HARVEST (Left) Subjective: Tired this morning due to all the activity yesterday.   Objective: Vital signs in last 24 hours: Temp:  [97.4 F (36.3 C)-100.1 F (37.8 C)] 98.3 F (36.8 C) (12/11 0435) Pulse Rate:  [66-90] 72 (12/11 0435) Cardiac Rhythm: Normal sinus rhythm (12/10 1900) Resp:  [15-28] 20 (12/11 0435) BP: (88-116)/(56-76) 106/62 (12/11 0435) SpO2:  [90 %-97 %] 94 % (12/11 0435) FiO2 (%):  [36 %] 36 % (12/10 1105) Weight:  [210 lb 1.6 oz (95.3 kg)] 210 lb 1.6 oz (95.3 kg) (12/11 0435)     Intake/Output from previous day: 12/10 0701 - 12/11 0700 In: 420 [P.O.:330; I.V.:90] Out: 550 [Urine:550] Intake/Output this shift: No intake/output data recorded.  General appearance: alert, cooperative and no distress Heart: regular rate and rhythm, S1, S2 normal, no murmur, click, rub or gallop Lungs: clear to auscultation bilaterally and diminished in the lower lobes bilaterally Abdomen: soft, non-tender; bowel sounds normal; no masses,  no organomegaly Extremities: extremities normal, atraumatic, no cyanosis or edema Wound: clean and dry  Lab Results:  Recent Labs  05/31/16 0454 06/01/16 0447  WBC 11.0* 10.4  HGB 10.9* 10.8*  HCT 33.4* 33.1*  PLT 156 175   BMET:  Recent Labs  05/31/16 0454 06/01/16 0447  NA 136 136  K 3.9 3.9  CL 100* 101  CO2 28 23  GLUCOSE 92 108*  BUN 16 23*  CREATININE 0.89 0.85  CALCIUM 8.0* 8.2*    PT/INR:  Recent Labs  05/29/16 1445  LABPROT 17.1*  INR 1.38   ABG    Component Value Date/Time   PHART 7.377 05/29/2016 1834   HCO3 26.3 05/29/2016 1834   TCO2 27 05/30/2016 1631   ACIDBASEDEF 1.0 05/29/2016 1340   O2SAT 92.0 05/29/2016 1834   CBG (last 3)   Recent Labs  05/31/16 1640 05/31/16 2129 06/01/16 0625  GLUCAP 109* 119* 103*    Assessment/Plan: S/P Procedure(s) (LRB): CORONARY ARTERY BYPASS GRAFTING (CABG) x 3 with bilateral internal mammary arteries and left radial artery grafting LIMA-LAD FREE RIMA-RCA LEFT RADIAL-OM (N/A) TRANSESOPHAGEAL ECHOCARDIOGRAM (TEE) (N/A) RADIAL ARTERY HARVEST (Left)  1. CV-Imdur, Lopressor, BP well controlled. NSR 70s. Wires out tomorrow. 2. Pulm-CXR shows tiny bilateral pleural effusions, Atelectasis and mixed interstitial and airspace opacification. Remains on 5L [place]. Shared that the neb treatments did not work to help him breathe 3. Renal-creatinine stable. Weight continues to trend down. On 40mg  Lasix. 4. Endocrine-blood glucose level well controlled 5. H and H stable 6. Plan: Ambulate. Encouraged incentive spirometry hourly. Weaning of oxygen as tolerated. Continue diuretics for fluid overload.    LOS: 4 days    Dennis Winters 06/01/2016   Chart reviewed, patient examined, agree with above. He is making good progress for POD 3. Still on oxygen with bibasilar atelectasis on CXR. Continue IS, ambulation. Continue diuresis. Glucose has been fine. Stop CBG's and SSI.

## 2016-06-01 NOTE — Progress Notes (Signed)
CARDIAC REHAB PHASE I   PRE:  Rate/Rhythm: 67 SR  BP:  Sitting: 123/86        SaO2: 96 5L  MODE:  Ambulation: 350 ft   POST:  Rate/Rhythm: 74 SR  BP:  Sitting: 102/73         SaO2: 88 4L  Pt in bed, states he is very tired from lack of sleep. Pt sats 96% on 5L, agreeable to try to walk on 4L (sats 94% on 4L at rest). Verbal reminder cues for sternal precautions, moderate assist from lying to sitting position, stood with min assist. Pt ambulated 350 ft on 4L O2, rolling walker, steady gait, assist x1, tolerated fairly well. Pt c/o dizziness initially, improved with distance, DOE, fatigue, standing rest x2. Encouraged IS, out of bed, additional ambulation x2 today. Pt to recliner after walk, feet elevated, call bell within reach. Will follow.  [phone] Joylene GrapesEmily C Raegan Winders, RN, BSN 06/01/2016 10:20 AM

## 2016-06-02 LAB — HEMOGLOBIN A1C
HEMOGLOBIN A1C: 5.7 % — AB (ref 4.8–5.6)
MEAN PLASMA GLUCOSE: 117 mg/dL

## 2016-06-02 MED ORDER — LEVALBUTEROL HCL 0.63 MG/3ML IN NEBU
0.6300 mg | INHALATION_SOLUTION | Freq: Three times a day (TID) | RESPIRATORY_TRACT | Status: DC
  Administered 2016-06-02 – 2016-06-04 (×7): 0.63 mg via RESPIRATORY_TRACT
  Filled 2016-06-02 (×7): qty 3

## 2016-06-02 NOTE — Progress Notes (Signed)
CARDIAC REHAB PHASE I   PRE:  Rate/Rhythm: 72 SR  BP:  Supine:   Sitting: 117/68  Standing:    SaO2: 93% 5L  MODE:  Ambulation: 550 ft   POST:  Rate/Rhythm: 87 SR  BP:  Supine:   Sitting: 115/76  Standing:    SaO2: 94% in hall and room on 6L [id number] Pt using IS. Pt walked 550 ft on 6L with rolling walker and minimal asst. Walked on 4L yesterday and desat so that is why we walked on 6L. Tolerated well. Stopped a couple of times to rest. Back to recliner. Re enforced IS 10x an hour. Left on 5L in room.   Luetta Nuttingharlene Terricka Onofrio, RN BSN  06/02/2016 10:24 AM

## 2016-06-02 NOTE — Care Management Note (Signed)
Case Management Note Donn PieriniKristi Reedy Biernat RN, BSN Unit 2W-Case Manager [phone]  Patient Details  Name: Dennis QuickMarshall Bartosiewicz MRN: [id number] Date of Birth: 1960/07/28  Subjective/Objective:   Pt admitted with c/p, found MVD, s/p CABGx3 on 12/5- tx from ICU to 2W on 05/31/16                 Action/Plan: PTA pt lived at home, has SO that can assist on discharge- anticipate return home- CM to follow  Expected Discharge Date:                  Expected Discharge Plan:  Home/Self Care  In-House Referral:     Discharge planning Services  CM Consult  Post Acute Care Choice:    Choice offered to:     DME Arranged:    DME Agency:     HH Arranged:    HH Agency:     Status of Service:  In process, will continue to follow  If discussed at Long Length of Stay Meetings, dates discussed:    Additional Comments:  Darrold SpanWebster, Kenadie Royce Hall, RN 06/02/2016, 2:28 PM

## 2016-06-02 NOTE — Progress Notes (Signed)
EP wires have been pulled. They were intact. Patient has been instructed to remain on bedrest for one hour. Vitals to be taken every 15 minutes. Will continue to monitor.

## 2016-06-02 NOTE — Progress Notes (Addendum)
      [address]       [place] [id number]             [phone]      4 Days Post-Op Procedure(s) (LRB): CORONARY ARTERY BYPASS GRAFTING (CABG) x 3 with bilateral internal mammary arteries and left radial artery grafting LIMA-LAD FREE RIMA-RCA LEFT RADIAL-OM (N/A) TRANSESOPHAGEAL ECHOCARDIOGRAM (TEE) (N/A) RADIAL ARTERY HARVEST (Left)   Subjective:  Mr. Montez MoritaCarter complains of pain in his chest and arm today.  He continues to have oxygen requirements with sats dropping to the 70s overnight.  + ambulation  + BM  Objective: Vital signs in last 24 hours: Temp:  [98.1 F (36.7 C)-98.6 F (37 C)] 98.6 F (37 C) (12/12 0503) Pulse Rate:  [74-78] 74 (12/12 0503) Cardiac Rhythm: Normal sinus rhythm (12/11 1924) Resp:  [18-20] 20 (12/12 0503) BP: (101-126)/(62-77) 126/77 (12/12 0503) SpO2:  [90 %-97 %] 95 % (12/12 0503) Weight:  [207 lb 6.4 oz (94.1 kg)] 207 lb 6.4 oz (94.1 kg) (12/12 0224)  Intake/Output from previous day: 12/11 0701 - 12/12 0700 In: 726 [P.O.:720; I.V.:6] Out: -   General appearance: alert, cooperative and no distress Heart: regular rate and rhythm Lungs: diminished breath sounds bibasilar Abdomen: soft, non-tender; bowel sounds normal; no masses,  no organomegaly Extremities: edema no edema Wound: clean and dry  Lab Results:  Recent Labs  05/31/16 0454 06/01/16 0447  WBC 11.0* 10.4  HGB 10.9* 10.8*  HCT 33.4* 33.1*  PLT 156 175   BMET:  Recent Labs  05/31/16 0454 06/01/16 0447  NA 136 136  K 3.9 3.9  CL 100* 101  CO2 28 23  GLUCOSE 92 108*  BUN 16 23*  CREATININE 0.89 0.85  CALCIUM 8.0* 8.2*    PT/INR: No results for input(s): LABPROT, INR in the last 72 hours. ABG    Component Value Date/Time   PHART 7.377 05/29/2016 1834   HCO3 26.3 05/29/2016 1834   TCO2 27 05/30/2016 1631   ACIDBASEDEF 1.0 05/29/2016 1340   O2SAT 92.0 05/29/2016 1834   CBG (last 3)   Recent Labs  05/31/16 1640 05/31/16 2129 06/01/16 0625   GLUCAP 109* 119* 103*    Assessment/Plan: S/P Procedure(s) (LRB): CORONARY ARTERY BYPASS GRAFTING (CABG) x 3 with bilateral internal mammary arteries and left radial artery grafting LIMA-LAD FREE RIMA-RCA LEFT RADIAL-OM (N/A) TRANSESOPHAGEAL ECHOCARDIOGRAM (TEE) (N/A) RADIAL ARTERY HARVEST (Left)  1. CV- hemodynamically stable, continue Lopressor, Imdur for radial graft.. BP too labile for ACE 2. Pulm- COPD, atelectasis, oxygen use ranging from 3-5L, will add xopenex nebs, continue IS.. May ultimately need oxygen at discharge 3. Renal-creatinine WNL, weight continues to trend down, continue Lasix 4. DIspo- patient continues to have difficulty getting off oxygen, will start nebs, continue IS, remove EPWs, will get repeat CXR in AM   LOS: 5 days    Raford PitcherBARRETT, ERIN 06/02/2016   Chart reviewed, patient examined, agree with above. Still on oxygen with decreased breath sounds in both lower lobes. Only doing 500-750 on IS. Has a lot of chest wall pain. Ambulated twice today so far. Continue IS, bronchodilator. Repeat CXR in the am.

## 2016-06-03 ENCOUNTER — Inpatient Hospital Stay (HOSPITAL_COMMUNITY): Payer: 59

## 2016-06-03 NOTE — Progress Notes (Addendum)
      [address]       [place] [id number]             [phone]      5 Days Post-Op Procedure(s) (LRB): CORONARY ARTERY BYPASS GRAFTING (CABG) x 3 with bilateral internal mammary arteries and left radial artery grafting LIMA-LAD FREE RIMA-RCA LEFT RADIAL-OM (N/A) TRANSESOPHAGEAL ECHOCARDIOGRAM (TEE) (N/A) RADIAL ARTERY HARVEST (Left)   Subjective:  [name] states he is breathing a little better.  He continues to have chest discomfort which is worse with coughing.  Objective: Vital signs in last 24 hours: Temp:  [97.4 F (36.3 C)-99.1 F (37.3 C)] 97.4 F (36.3 C) (12/13 0554) Pulse Rate:  [63-77] 70 (12/13 0554) Cardiac Rhythm: Normal sinus rhythm (12/12 1900) Resp:  [18] 18 (12/13 0554) BP: (101-118)/(62-75) 110/62 (12/13 0554) SpO2:  [89 %-94 %] 94 % (12/13 0554) Weight:  [205 lb 1.6 oz (93 kg)] 205 lb 1.6 oz (93 kg) (12/13 0554)  Intake/Output from previous day: 12/12 0701 - 12/13 0700 In: 600 [P.O.:600] Out: -   General appearance: alert, cooperative and no distress Heart: regular rate and rhythm Lungs: diminished breath sounds bibasilar Abdomen: soft, non-tender; bowel sounds normal; no masses,  no organomegaly Extremities: edema trace Wound: clean and dry  Lab Results:  Recent Labs  06/01/16 0447  WBC 10.4  HGB 10.8*  HCT 33.1*  PLT 175   BMET:  Recent Labs  06/01/16 0447  NA 136  K 3.9  CL 101  CO2 23  GLUCOSE 108*  BUN 23*  CREATININE 0.85  CALCIUM 8.2*    PT/INR: No results for input(s): LABPROT, INR in the last 72 hours. ABG    Component Value Date/Time   PHART 7.377 05/29/2016 1834   HCO3 26.3 05/29/2016 1834   TCO2 27 05/30/2016 1631   ACIDBASEDEF 1.0 05/29/2016 1340   O2SAT 92.0 05/29/2016 1834   CBG (last 3)   Recent Labs  05/31/16 1640 05/31/16 2129 06/01/16 0625  GLUCAP 109* 119* 103*    Assessment/Plan: S/P Procedure(s) (LRB): CORONARY ARTERY BYPASS GRAFTING (CABG) x 3 with bilateral  internal mammary arteries and left radial artery grafting LIMA-LAD FREE RIMA-RCA LEFT RADIAL-OM (N/A) TRANSESOPHAGEAL ECHOCARDIOGRAM (TEE) (N/A) RADIAL ARTERY HARVEST (Left)  1. CV- hemodynamically stable- continue Lopressor, Imdur for radial graft 2. Pulm- COPD, CXR is unchanged, + atelectasis, ? Pleural fluid, tiny... Continue nebs, IS.Marland Kitchen. Could switch to Mucomyst.... However may need to get a CTA of chest to R/O PE 3. Renal- weight is at baseline, on oral Lasix 4. Dispo- continues to require oxygen at 5-6L, CXR is unchanged for the most part... May want to R/O PE will discuss with Dr. Laneta SimmersBartle   LOS: 6 days    Lowella DandyBARRETT, ERIN 06/03/2016    Chart reviewed, patient examined, agree with above. He feels better today. Sats are in the low 90's on room air this morning. He doesn't think the sat monitor was working correctly yesterday. CXR looks ok but low lung volumes. There is patchy atelectasis. I don't think he has a PE and does not need a CT. He needs to continue using his IS and ambulate. If sats are less than 89 % with ambulation then I would arrange home oxygen. He may be ale to go home tomorrow.

## 2016-06-03 NOTE — Progress Notes (Signed)
CARDIAC REHAB PHASE I   PRE:  Rate/Rhythm: 66 SR    BP: sitting 113/67    SaO2: 92 RA on ear (86 RA finger)  MODE:  Ambulation: 550 ft   POST:  Rate/Rhythm: 77 SR    BP: sitting 100/74     SaO2: 87-88 RA, up to 91 RA  Pt sts he has been feeling "yucky" for last hour. He was nauseated earlier. Willing to walk. Tolerated well with RW. No O2 needed. He began c/o right wrist "throbbing" at radial site when I took BP on that arm. He continued to c/o of this during and after walking. Notified RN and put a hot pack on wrist. Hot pack helped. Had him do IS, inspiring 650 mL. Encouraged more use, girlfriend present and will help. Encouraged x2 more walks. [phone]   Harriet MassonRandi Kristan Sonia Stickels CES, ACSM 06/03/2016 2:07 PM

## 2016-06-03 NOTE — Progress Notes (Signed)
Patient is on 5L O2 per respiratory. O2 sats 89-92% with occasional 93%. Patient's head elevated to help with O2 sats.  Encouraged patient to use IS. Patient acknowledged understanding.

## 2016-06-04 ENCOUNTER — Other Ambulatory Visit: Payer: Self-pay | Admitting: *Deleted

## 2016-06-04 DIAGNOSIS — J9811 Atelectasis: Secondary | ICD-10-CM

## 2016-06-04 MED ORDER — MOMETASONE FURO-FORMOTEROL FUM 200-5 MCG/ACT IN AERO: 2.0000 | INHALATION_SPRAY | Freq: Two times a day (BID) | RESPIRATORY_TRACT | 1 refills | Status: DC

## 2016-06-04 MED ORDER — FLUTICASONE-SALMETEROL 250-50 MCG/DOSE IN AEPB: 1.0000 | INHALATION_SPRAY | Freq: Two times a day (BID) | RESPIRATORY_TRACT | 1 refills | Status: DC

## 2016-06-04 MED ORDER — ISOSORBIDE MONONITRATE ER 30 MG PO TB24: 15.0000 mg | ORAL_TABLET | Freq: Every day | ORAL | 0 refills | Status: DC

## 2016-06-04 MED ORDER — POTASSIUM CHLORIDE CRYS ER 20 MEQ PO TBCR: 20.0000 meq | EXTENDED_RELEASE_TABLET | Freq: Two times a day (BID) | ORAL | 0 refills | Status: DC

## 2016-06-04 MED ORDER — OXYCODONE HCL 5 MG PO TABS: 5.0000 mg | ORAL_TABLET | ORAL | 0 refills | Status: DC | PRN

## 2016-06-04 MED ORDER — METOPROLOL TARTRATE 25 MG PO TABS
12.5000 mg | ORAL_TABLET | Freq: Two times a day (BID) | ORAL | 3 refills | Status: DC
Start: 2016-06-04 — End: 2017-11-22

## 2016-06-04 MED ORDER — PANTOPRAZOLE SODIUM 40 MG PO TBEC: 40.0000 mg | DELAYED_RELEASE_TABLET | Freq: Every day | ORAL | 3 refills | Status: DC

## 2016-06-04 MED ORDER — FUROSEMIDE 40 MG PO TABS: 40.0000 mg | ORAL_TABLET | Freq: Every day | ORAL | 0 refills | Status: DC

## 2016-06-04 MED ORDER — ASPIRIN 325 MG PO TBEC: 325.0000 mg | DELAYED_RELEASE_TABLET | Freq: Every day | ORAL | 0 refills | Status: DC

## 2016-06-04 MED ORDER — OMEGA-3-ACID ETHYL ESTERS 1 G PO CAPS: 1.0000 g | ORAL_CAPSULE | Freq: Two times a day (BID) | ORAL | Status: DC

## 2016-06-04 NOTE — Progress Notes (Signed)
CARDIAC REHAB PHASE I   Pt just returned from walking with family. Agreeable to walk to document O2 sats. Pt sats 100% on RA at rest (reading from forehead, reading from finger 88-90% on RA at rest). Pt ambulated 150 ft on RA, hand held assist, steady gait, tolerated well with no complaints, sats 98-100% on RA during ambulation. Pt does not qualify for home O2, case manager notified. Cardiac surgery discharge education completed with pt and significant other at bedside. Reviewed risk factors, tobacco cessation (gave pt fake cigarette), IS, sternal precautions, activity progression, exercise, heart healthy diet, daily weights and phase 2 cardiac rehab. Pt verbalized understanding. Pt agrees to phase 2 cardiac rehab referral, [facility]. Pt to edge of bed per pt request after walk, call bell within reach, awaiting discharge.  SATURATION QUALIFICATIONS: (This note is used to comply with regulatory documentation for home oxygen)  Patient Saturations on Room Air at Rest = 100%  Patient Saturations on Room Air while Ambulating = 98-100 %  Patient Saturations on  Liters of oxygen while Ambulating = N/A  Please briefly explain why patient needs home oxygen: Pt does not qualify for home O2  [id number] Joylene GrapesEmily C Traniece Boffa, RN, BSN 06/04/2016 10:24 AM

## 2016-06-04 NOTE — Progress Notes (Addendum)
      [address]       [place] [id number]             [phone]      6 Days Post-Op Procedure(s) (LRB): CORONARY ARTERY BYPASS GRAFTING (CABG) x 3 with bilateral internal mammary arteries and left radial artery grafting LIMA-LAD FREE RIMA-RCA LEFT RADIAL-OM (N/A) TRANSESOPHAGEAL ECHOCARDIOGRAM (TEE) (N/A) RADIAL ARTERY HARVEST (Left)   Subjective:  [name] complains of pain along his right wrist.  He states it just started hurting.  He thinks its due to the blood pressure checks in that arm.  He is off oxygen.  + ambulation  Objective: Vital signs in last 24 hours: Temp:  [98.2 F (36.8 C)-98.5 F (36.9 C)] 98.3 F (36.8 C) (12/14 0500) Pulse Rate:  [65-77] 74 (12/14 0500) Cardiac Rhythm: Normal sinus rhythm (12/13 1900) Resp:  [18-20] 20 (12/14 0500) BP: (98-122)/(59-71) 120/71 (12/14 0500) SpO2:  [84 %-94 %] 91 % (12/14 0500) Weight:  [202 lb 1.6 oz (91.7 kg)] 202 lb 1.6 oz (91.7 kg) (12/14 0500)  Intake/Output from previous day: 12/13 0701 - 12/14 0700 In: 960 [P.O.:960] Out: -   General appearance: alert, cooperative and no distress Heart: regular rate and rhythm Lungs: diminished breath sounds bibasilar Abdomen: soft, non-tender; bowel sounds normal; no masses,  no organomegaly Extremities: edema trace, RUE mild bruising, minimal edema at radial art line site Wound: clean and dry  Lab Results: No results for input(s): WBC, HGB, HCT, PLT in the last 72 hours. BMET: No results for input(s): NA, K, CL, CO2, GLUCOSE, BUN, CREATININE, CALCIUM in the last 72 hours.  PT/INR: No results for input(s): LABPROT, INR in the last 72 hours. ABG    Component Value Date/Time   PHART 7.377 05/29/2016 1834   HCO3 26.3 05/29/2016 1834   TCO2 27 05/30/2016 1631   ACIDBASEDEF 1.0 05/29/2016 1340   O2SAT 92.0 05/29/2016 1834   CBG (last 3)  No results for input(s): GLUCAP in the last 72 hours.  Assessment/Plan: S/P Procedure(s) (LRB): CORONARY ARTERY  BYPASS GRAFTING (CABG) x 3 with bilateral internal mammary arteries and left radial artery grafting LIMA-LAD FREE RIMA-RCA LEFT RADIAL-OM (N/A) TRANSESOPHAGEAL ECHOCARDIOGRAM (TEE) (N/A) RADIAL ARTERY HARVEST (Left)  1. CV- NSR, hemodynamically stable continue Lopressor, Imdur 2. Pulm- no acute issues, off oxygen, instructed patient on aggressive IS use at discharge 3. Renal- creatinine WNL, remains mildly hypervolemic- continue Lasix 4. RUE pain-area near wrist is benign, minimal bruising, good pulses present..continue ice prn 5. dispo- patient stable, wrist pain is benign, exam in unremarkable will mention to Dr. Laneta SimmersBartle, but should be able to discharge patient home today   LOS: 7 days    Lowella DandyBARRETT, ERIN 06/04/2016   Chart reviewed, patient examined, agree with above. He looks well and sats are fine on room air. Lungs sound ok. He needs to continue IS and ambulation and he can do that at home. He is ok to go home today. His wt is below preop so I would only keep him on lasix and KCL for 5 more days at home.

## 2016-06-04 NOTE — Progress Notes (Signed)
Patient given discharge instructions medication list and paper prescriptions. Along with follow up appointments. Patient IV and tele dcd, will discharge home as ordered. Germani Gavilanes, Randall AnKristin Jessup RN

## 2016-06-10 ENCOUNTER — Ambulatory Visit (INDEPENDENT_AMBULATORY_CARE_PROVIDER_SITE_OTHER): Payer: Self-pay

## 2016-06-10 DIAGNOSIS — Z951 Presence of aortocoronary bypass graft: Secondary | ICD-10-CM

## 2016-06-10 DIAGNOSIS — Z4802 Encounter for removal of sutures: Secondary | ICD-10-CM

## 2016-06-10 NOTE — Progress Notes (Signed)
Removed 4 sutures from chest tube sites with no signs of infection and patient tolerated well. 

## 2016-06-15 ENCOUNTER — Emergency Department (HOSPITAL_COMMUNITY)
Admission: EM | Admit: 2016-06-15 | Discharge: 2016-06-15 | Disposition: A | Discharge status: Home / Self Care | Payer: 59 | Attending: Emergency Medicine | Admitting: Emergency Medicine

## 2016-06-15 ENCOUNTER — Emergency Department (HOSPITAL_COMMUNITY): Payer: 59

## 2016-06-15 DIAGNOSIS — K2970 Gastritis, unspecified, without bleeding: Secondary | ICD-10-CM

## 2016-06-15 DIAGNOSIS — Z951 Presence of aortocoronary bypass graft: Secondary | ICD-10-CM | POA: Diagnosis not present

## 2016-06-15 DIAGNOSIS — R059 Cough, unspecified: Secondary | ICD-10-CM

## 2016-06-15 DIAGNOSIS — R0789 Other chest pain: Secondary | ICD-10-CM

## 2016-06-15 DIAGNOSIS — I2510 Atherosclerotic heart disease of native coronary artery without angina pectoris: Secondary | ICD-10-CM | POA: Diagnosis not present

## 2016-06-15 DIAGNOSIS — K219 Gastro-esophageal reflux disease without esophagitis: Secondary | ICD-10-CM | POA: Insufficient documentation

## 2016-06-15 DIAGNOSIS — I252 Old myocardial infarction: Secondary | ICD-10-CM | POA: Insufficient documentation

## 2016-06-15 DIAGNOSIS — Z79899 Other long term (current) drug therapy: Secondary | ICD-10-CM | POA: Diagnosis not present

## 2016-06-15 DIAGNOSIS — I10 Essential (primary) hypertension: Secondary | ICD-10-CM | POA: Insufficient documentation

## 2016-06-15 DIAGNOSIS — Z7982 Long term (current) use of aspirin: Secondary | ICD-10-CM | POA: Insufficient documentation

## 2016-06-15 DIAGNOSIS — F17200 Nicotine dependence, unspecified, uncomplicated: Secondary | ICD-10-CM | POA: Diagnosis not present

## 2016-06-15 DIAGNOSIS — R05 Cough: Secondary | ICD-10-CM

## 2016-06-15 DIAGNOSIS — R1013 Epigastric pain: Secondary | ICD-10-CM

## 2016-06-15 LAB — I-STAT TROPONIN, ED
TROPONIN I, POC: 0 ng/mL (ref 0.00–0.08)
Troponin i, poc: 0.01 ng/mL (ref 0.00–0.08)

## 2016-06-15 LAB — BASIC METABOLIC PANEL
ANION GAP: 9 (ref 5–15)
CALCIUM: 9.3 mg/dL (ref 8.9–10.3)
CO2: 28 mmol/L (ref 22–32)
Chloride: 102 mmol/L (ref 101–111)
Creatinine, Ser: 1.07 mg/dL (ref 0.61–1.24)
GFR calc Af Amer: >60 mL/min (ref >60)
GLUCOSE: 85 mg/dL (ref 65–99)
Potassium: 4.2 mmol/L (ref 3.5–5.1)
Sodium: 139 mmol/L (ref 135–145)

## 2016-06-15 LAB — HEPATIC FUNCTION PANEL
ALT: 19 U/L (ref 17–63)
AST: 18 U/L (ref 15–41)
Albumin: 3.5 g/dL (ref 3.5–5.0)
Alkaline Phosphatase: 102 U/L (ref 38–126)
BILIRUBIN TOTAL: 0.5 mg/dL (ref 0.3–1.2)
Total Protein: 7.2 g/dL (ref 6.5–8.1)

## 2016-06-15 LAB — CBC
HCT: 38 % — ABNORMAL LOW (ref 39.0–52.0)
Hemoglobin: 13 g/dL (ref 13.0–17.0)
MCH: 31.3 pg (ref 26.0–34.0)
MCHC: 34.2 g/dL (ref 30.0–36.0)
MCV: 91.3 fL (ref 78.0–100.0)
Platelets: 805 10*3/uL — ABNORMAL HIGH (ref 150–400)
RBC: 4.16 MIL/uL — ABNORMAL LOW (ref 4.22–5.81)
RDW: 12.8 % (ref 11.5–15.5)
WBC: 11.1 10*3/uL — ABNORMAL HIGH (ref 4.0–10.5)

## 2016-06-15 LAB — LIPASE, BLOOD: Lipase: 44 U/L (ref 11–51)

## 2016-06-15 MED ORDER — GI COCKTAIL ~~LOC~~
30.0000 mL | Freq: Once | ORAL | Status: AC
  Administered 2016-06-15: 30 mL via ORAL
  Filled 2016-06-15: qty 30

## 2016-06-15 MED ORDER — MORPHINE SULFATE (PF) 4 MG/ML IV SOLN
4.0000 mg | Freq: Once | INTRAVENOUS | Status: AC
  Administered 2016-06-15: 4 mg via INTRAVENOUS
  Filled 2016-06-15: qty 1

## 2016-06-15 MED ORDER — RANITIDINE HCL 150 MG PO TABS: 150.0000 mg | ORAL_TABLET | Freq: Two times a day (BID) | ORAL | 0 refills | Status: DC

## 2016-06-15 MED ORDER — NITROGLYCERIN 0.4 MG SL SUBL: 0.4000 mg | SUBLINGUAL_TABLET | SUBLINGUAL | Status: DC | PRN

## 2016-06-15 NOTE — ED Triage Notes (Signed)
Pt complaining of right sided chest pain and central chest pressure. Pt states bypass on 05/29/2016. Pt states pain started yesterday. Pt states worsening pain today.

## 2016-06-15 NOTE — ED Notes (Signed)
Patient able to ambulate independently  

## 2016-06-15 NOTE — ED Notes (Signed)
Called lab able to add lipase and hepatic function panel to available blood.

## 2016-06-15 NOTE — ED Provider Notes (Signed)
[facility]-EMERGENCY DEPT Provider Note   CSN: [id number] Arrival date & time: 06/15/16  1520     History   Chief Complaint Chief Complaint  Patient presents with  . Chest Pain    HPI Lavonne Cass is a 55 y.o. male with a PMHx of CAD, NSTEMI s/p CABG x3 by Dr. Laneta Simmers on 05/29/16, HLD, HTN, and cholelithiasis s/p cholecystectomy, who presents to the ED with complaints of gradual onset worsening right-sided chest pain that began yesterday afternoon while at rest. He states that on Saturday (day before onset of pain, 2 days prior to visit) he "jerked his arm around" and thinks he overdid it because the next day he developed some mild chest pain that gradually worsened. He describes the pain as 8/10 constant heaviness and tightness in the right and center of his chest, radiating somewhat into the epigastric area, worse with movement, somewhat improved with walking around/mild exertion, and mildly improved with ibuprofen. He also reports that he had a cough prior to his CABG but that over the last 2 days it has worsened, and he has had green sputum production. Additionally he reports that his needed approximately 3 pillows to sleep over the last several days.  He denies diaphoresis, lightheadedness, fevers, chills, wheezing, SOB, hemoptysis, LE swelling, recent travel, family/personal hx of DVT/PE, abd pain, N/V/C, hematuria, dysuria, myalgias, arthralgias, new numbness/tingling, weakness, claudication, or any other complaints at this time. Reports he's had some paresthesias in his L wrist ever since the radial artery harvesting for his CABG, but this hasn't changed. Also mentions that he accidentally was taking 1tab Imdur 30mg  tabs instead of the 1/2 tab he was instructed to take. Reports some looser stools since leaving the hospital but denies frank diarrhea. +FHx of CAD in his father at age 21, who required a CABG. +Former smoker, quit on 05/26/16.   The history is provided by the patient and medical  records. No language interpreter was used.  Chest Pain   This is a new problem. The current episode started yesterday. The problem occurs constantly. The problem has been gradually worsening. The pain is associated with rest. The pain is present in the substernal region and lateral region. The pain is at a severity of 8/10. The pain is moderate. The quality of the pain is described as heavy (and tightness). The pain radiates to the epigastrium. Duration of episode(s) is 1 day. Exacerbated by: movement. Associated symptoms include cough, orthopnea (3 pillows) and sputum production. Pertinent negatives include no abdominal pain, no claudication, no diaphoresis, no fever, no lower extremity edema, no nausea, no numbness, no shortness of breath, no vomiting and no weakness. Treatments tried: exertion and ibuprofen help. The treatment provided mild relief. Risk factors include male gender and smoking/tobacco exposure.  His past medical history is significant for CAD, hyperlipidemia, hypertension and MI.  Pertinent negatives for past medical history include no DVT and no PE.  His family medical history is significant for CAD.  Procedure history is positive for cardiac catheterization and echocardiogram.    Past Medical History:  Diagnosis Date  . Coronary artery disease   . High cholesterol   . Hypertension     Patient Active Problem List   Diagnosis Date Noted  . 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 NSTEMI (non-ST elevated myocardial infarction) (HCC)   . Chest pain 05/27/2016  . High cholesterol   . Essential hypertension   . Calculus of gallbladder with acute cholecystitis, without mention of obstruction 04/19/2013  Past Surgical History:  Procedure Laterality Date  . ANKLE SURGERY    . CARDIAC CATHETERIZATION N/A 05/28/2016   Procedure: Left Heart Cath and Coronary Angiography;  Surgeon: Kathleene Hazel, MD;  Location: [facility] INVASIVE CV LAB;  Service: Cardiovascular;  Laterality: N/A;  .  CHOLECYSTECTOMY N/A 04/13/2013   Procedure: LAPAROSCOPIC CHOLECYSTECTOMY , attempted IOC.;  Surgeon: Ernestene Mention, MD;  Location: [facility] ORS;  Service: General;  Laterality: N/A;  . CORONARY ARTERY BYPASS GRAFT    . CORONARY ARTERY BYPASS GRAFT N/A 05/29/2016   Procedure: CORONARY ARTERY BYPASS GRAFTING (CABG) x 3 with bilateral internal mammary arteries and left radial artery grafting LIMA-LAD FREE RIMA-RCA LEFT RADIAL-OM;  Surgeon: Alleen Borne, MD;  Location: [facility] OR;  Service: Open Heart Surgery;  Laterality: N/A;  . RADIAL ARTERY HARVEST Left 05/29/2016   Procedure: RADIAL ARTERY HARVEST;  Surgeon: Alleen Borne, MD;  Location: [facility] OR;  Service: Open Heart Surgery;  Laterality: Left;  . TEE WITHOUT CARDIOVERSION N/A 05/29/2016   Procedure: TRANSESOPHAGEAL ECHOCARDIOGRAM (TEE);  Surgeon: Alleen Borne, MD;  Location: [facility] OR;  Service: Open Heart Surgery;  Laterality: N/A;       Home Medications    Prior to Admission medications   Medication Sig Start Date End Date Taking? Authorizing Provider  aspirin EC 325 MG EC tablet Take 1 tablet (325 mg total) by mouth daily. 06/04/16   Erin R Barrett, PA-C  Fluticasone-Salmeterol (ADVAIR DISKUS) 250-50 MCG/DOSE AEPB Inhale 1 puff into the lungs every 12 (twelve) hours. 06/04/16 07/04/16  Erin R Barrett, PA-C  furosemide (LASIX) 40 MG tablet Take 1 tablet (40 mg total) by mouth daily. For 7 Days 06/04/16   Erin R Barrett, PA-C  isosorbide mononitrate (IMDUR) 30 MG 24 hr tablet Take 0.5 tablets (15 mg total) by mouth daily. 06/04/16   Erin R Barrett, PA-C  metoprolol tartrate (LOPRESSOR) 25 MG tablet Take 0.5 tablets (12.5 mg total) by mouth 2 (two) times daily. 06/04/16   Erin R Barrett, PA-C  Multiple Vitamin (MULTIVITAMIN WITH MINERALS) TABS tablet Take 1 tablet by mouth daily.    Historical Provider, MD  niacin 500 MG tablet Take 500 mg by mouth daily.    Historical Provider, MD  omega-3 acid ethyl esters (LOVAZA) 1 g capsule Take 1 capsule (1 g  total) by mouth 2 (two) times daily. 06/04/16   Erin R Barrett, PA-C  oxyCODONE (OXY IR/ROXICODONE) 5 MG immediate release tablet Take 1-2 tablets (5-10 mg total) by mouth every 3 (three) hours as needed for severe pain. 06/04/16   Erin R Barrett, PA-C  pantoprazole (PROTONIX) 40 MG tablet Take 1 tablet (40 mg total) by mouth daily. 06/04/16   Erin R Barrett, PA-C  potassium chloride SA (K-DUR,KLOR-CON) 20 MEQ tablet Take 1 tablet (20 mEq total) by mouth 2 (two) times daily. For 7 Days 06/04/16   Erin R Barrett, PA-C  simvastatin (ZOCOR) 40 MG tablet Take 40 mg by mouth daily.    Historical Provider, MD    Family History Family History  Problem Relation Age of Onset  . Pulmonary disease Maternal Aunt   . CAD Neg Hx     Social History Social History  Substance Use Topics  . Smoking status: Current Every Day Smoker    Packs/day: 0.50  . Smokeless tobacco: Never Used  . Alcohol use 3.6 oz/week    6 Cans of beer per week     Allergies   Lipitor [atorvastatin]; Penicillins; and Zocor [simvastatin]  Review of Systems Review of Systems  Constitutional: Negative for chills, diaphoresis and fever.  Respiratory: Positive for cough and sputum production. Negative for shortness of breath and wheezing.   Cardiovascular: Positive for chest pain and orthopnea (3 pillows). Negative for claudication and leg swelling.  Gastrointestinal: Negative for abdominal pain, constipation, nausea and vomiting.  Genitourinary: Negative for dysuria and hematuria.  Musculoskeletal: Negative for arthralgias and myalgias.  Skin: Negative for color change.  Allergic/Immunologic: Negative for immunocompromised state.  Neurological: Negative for weakness, light-headedness and numbness.  Psychiatric/Behavioral: Negative for confusion.   10 Systems reviewed and are negative for acute change except as noted in the HPI.   Physical Exam Updated Vital Signs BP 126/88 (BP Location: Right Arm)   Pulse 80   Temp  97.9 F (36.6 C) (Oral)   Resp 18   Ht 5\' 8"  (1.727 m)   Wt 89.4 kg   SpO2 100%   BMI 29.95 kg/m   Physical Exam  Constitutional: He is oriented to person, place, and time. Vital signs are normal. He appears well-developed and well-nourished.  Non-toxic appearance. No distress.  Afebrile, nontoxic, NAD  HENT:  Head: Normocephalic and atraumatic.  Mouth/Throat: Oropharynx is clear and moist and mucous membranes are normal.  Eyes: Conjunctivae and EOM are normal. Right eye exhibits no discharge. Left eye exhibits no discharge.  Neck: Normal range of motion. Neck supple.  Cardiovascular: Normal rate, regular rhythm, normal heart sounds and intact distal pulses.  Exam reveals no gallop and no friction rub.   No murmur heard. RRR, nl s1/s2, no m/r/g, distal pulses intact in b/l lower extremities, and R radial and L ulnar pulses intact, no L radial artery pulse as expected; no pedal edema   Pulmonary/Chest: Effort normal and breath sounds normal. No respiratory distress. He has no decreased breath sounds. He has no wheezes. He has no rhonchi. He has no rales. He exhibits tenderness. He exhibits no crepitus, no deformity and no retraction.    CTAB in all lung fields, no w/r/r, no hypoxia or increased WOB, speaking in full sentences, SpO2 100% on RA Chest wall with moderate TTP over R side and sternal area, well healing sternotomy incision noted, without crepitus, deformities, or retractions. No s/sx of infection, scar c/d/i  Abdominal: Soft. Normal appearance and bowel sounds are normal. He exhibits no distension. There is tenderness in the right upper quadrant and epigastric area. There is no rigidity, no rebound, no guarding, no CVA tenderness, no tenderness at McBurney's point and negative Murphy's sign.  Soft, nondistended, +BS throughout, with moderate epigastric and RUQ TTP, no r/g/r, neg murphy's, neg mcburney's, no CVA TTP   Musculoskeletal: Normal range of motion.  MAE x4 Strength and  sensation grossly intact in all extremities Distal pulses intact as mentioned above (L radial pulse not present as expected given harvesting procedure of that artery) Gait steady No pedal edema, neg homan's bilaterally   Neurological: He is alert and oriented to person, place, and time. He has normal strength. No sensory deficit.  Skin: Skin is warm, dry and intact. No rash noted.  Healing sternotomy scar as mentioned above Well healing L forearm incision which is c/d/i with minimal scabbing remaining, and no signs of cellulitis or infection  Psychiatric: He has a normal mood and affect.  Nursing note and vitals reviewed.    ED Treatments / Results  Labs (all labs ordered are listed, but only abnormal results are displayed) Labs Reviewed  BASIC METABOLIC PANEL - Abnormal; Notable  for the following:       Result Value   BUN <5 (*)    All other components within normal limits  CBC - Abnormal; Notable for the following:    WBC 11.1 (*)    RBC 4.16 (*)    HCT 38.0 (*)    Platelets 805 (*)    All other components within normal limits  HEPATIC FUNCTION PANEL - Abnormal; Notable for the following:    Bilirubin, Direct <0.1 (*)    All other components within normal limits  LIPASE, BLOOD  I-STAT TROPOININ, ED  I-STAT TROPOININ, ED    EKG  EKG Interpretation  Date/Time:  Monday June 15 2016 15:36:49 EST Ventricular Rate:  82 PR Interval:  152 QRS Duration: 74 QT Interval:  388 QTC Calculation: 453 R Axis:   9 Text Interpretation:  Normal sinus rhythm Nonspecific T wave abnormality Abnormal ECG When compared with ECG of 05/30/2016, No significant change was found Confirmed by [facility]  MD, DAVID ([id number]) on 06/15/2016 4:12:56 PM       Radiology Dg Chest 2 View  Result Date: 06/15/2016 CLINICAL DATA:  Right-sided chest pain and chest pressure EXAM: CHEST  2 VIEW COMPARISON:  06/03/2016 FINDINGS: Cardiac shadow is stable. Postoperative changes are again seen. Previously seen  patchy infiltrates bilaterally have nearly completely resolved with only minimal atelectatic changes in the bases bilaterally. No new focal effusion or infiltrate is seen. IMPRESSION: Near complete resolution of previously seen infiltrates. Electronically Signed   By: Alcide CleverMark  Lukens M.D.   On: 06/15/2016 17:07    TEE 05/29/16: Result status: Final result   Left ventricle: Concentric hypertrophy of mild severity. LV systolic function is normal with an EF of 55-60%.  Mitral valve: Trace regurgitation.     Procedures Procedures (including critical care time)  LHC 05/28/16: Narrative & Impression    1. Severe triple vessel CAD 2. The RCA is a large dominant vessel with moderate proximal stenosis and severe focal 99% mid stenosis. The distal vessel is patent and appears to be a good target for bypass. 3. The Circumflex gives off a large obtuse marginal branch. The obtuse marginal branch is occluded and fills late from left to left collaterals.  4. The LAD is a large caliber vessel that courses to the apex. The proximal and mid vessel has diffuse mild to moderate stenosis. The mid vessel just beyond the second diagonal branch has a 80% stenosis.  5. Left ventricular systolic function is preserved with hypokinesis of the inferior wall.   Recommendations: I will ask CT surgery to see him to discuss CABG. Will resume heparin drip 8 hours post sheath pull. Continue ASA and statin. Add beta blocker if BP tolerates.      Medications Ordered in ED Medications  morphine 4 MG/ML injection 4 mg (4 mg Intravenous Given 06/15/16 1631)  gi cocktail (Maalox,Lidocaine,Donnatal) (30 mLs Oral Given 06/15/16 1654)     Initial Impression / Assessment and Plan / ED Course  I have reviewed the triage vital signs and the nursing notes.  Pertinent labs & imaging results that were available during my care of the patient were reviewed by me and considered in my medical decision making (see chart for  details).  Clinical Course     55 y.o. male here with gradual onset CP x1 day, recent NSTEMI and CABG done 05/29/16. Sternotomy scar c/d/i and healing well. States he jerked his arm around 2 days ago and that's when the pain started. Chest wall  tender to palpation and reproduces pain, but also tender in epigastrum/RUQ. Endorses cough since before CABG, but worse x2 days, with green sputum production. No LE swelling, denies SOB, doubt PE. Took ASA this morning, will give morphine and GI cocktail, and NTG if that doesn't help. Symptoms don't sound as much like cardiac CP as opposed to musculoskeletal or gastritis/GERD related. Awaiting labs, will add-on lipase and LFTs, awaiting CXR. EKG unchanged from prior EKG, and without acute ischemic findings. Will reassess shortly  6:30 PM Pt feeling significantly better, states that the GI cocktail helped him more than the morphine did, which supports the gastritis/GERD theory of etiology of pain. CBC with very mildly elevated WBC 11.1 but appears slightly hemoconcentrated. BMP WNL. Trop neg. LFTs and Lipase unremarkable. CXR with near complete resolution of previously seen infiltrates, no acute findings. Given recent cardiac surgery and risk factors, will delta trop at 3hr mark, and then if symptoms continue to be resolved and troponin neg, we can likely get him home. Will reassess shortly.  8:15 PM Second trop neg. Pt continues to feel improved. Patient is to be discharged with recommendation to follow up with his PCP and cardiologist this week in regards to today's hospital visit. Pts symptoms likely related to musculoskeletal and GI etiologies, and pt has not reported any ongoing CP while in my care. Labs and imaging reviewed again prior to dc. CXR and ECG with no acute abnormalities, reassuring recent echo as above, and neg troponins x2. Will start on zantac, discussed GERD diet/lifestyle modifications, tylenol for pain, motrin on full stomach only as needed for  pain, additional tums/maalox PRN additional relief, and heat use to chest wall to see if this helps. Also discussed use of mucinex for cough, which is likely just from his recent surgery/intubation and him recently quitting smoking. Pt has been advised return to the ED if develop any exertional CP- strict return precautions discussed & patient's questions answered. Pt appears reliable for follow up and is agreeable to discharge. F/up with PCP and cardiologist in next 3-5 days. I explained the diagnosis and have given explicit precautions to return to the ER including for any other new or worsening symptoms. The patient understands and accepts the medical plan as it's been dictated and I have answered their questions. Discharge instructions concerning home care and prescriptions have been given. The patient is STABLE and is discharged to home in good condition.   Final Clinical Impressions(s) / ED Diagnoses   Final diagnoses:  Atypical chest pain  Epigastric abdominal pain  Cough  Gastritis, presence of bleeding unspecified, unspecified chronicity, unspecified gastritis type  Gastroesophageal reflux disease, esophagitis presence not specified  Chest wall pain    New Prescriptions New Prescriptions   RANITIDINE (ZANTAC) 150 MG TABLET    Take 1 tablet (150 mg total) by mouth 2 (two) times daily.     [address] Boyce, PA-C 06/15/16 2015    Dione Booze, MD 06/15/16 2214

## 2016-06-15 NOTE — Discharge Instructions (Signed)
Your chest pain is likely musculoskeletal pain related to straining your arm and having had recent open heart surgery, and your abdominal pain is likely from gastritis or maybe an ulcer. You will need to take zantac as directed, and avoid spicy/fatty/acidic foods, avoid soda/coffee/tea/alcohol. Avoid laying down flat within 30 minutes of eating. Avoid NSAIDs like ibuprofen/aleve/motrin/etc on an empty stomach, but you may take them only as needed for pain and only on a full stomach. Use tylenol as needed for additional pain relief. May consider using over the counter tums/maalox as needed for additional relief. Stay well hydrated. You may consider using heat to the areas of pain, no more than 20 minutes every hour.  May also want to consider taking mucinex for cough suppression/expectoration of mucous. Follow up with your primary care doctor and your cardiologist in the next 3-5 days for recheck of symptoms and for ongoing evaluation of your chest and abdominal pain. Return to the ER for changes or worsening symptoms.  SEEK IMMEDIATE MEDICAL ATTENTION IF: You develop a fever.  Your chest pains become severe or intolerable.  You develop new, unexplained symptoms (problems).  You develop shortness of breath, nausea, vomiting, sweating or feel light headed.  You develop a new cough or you cough up blood. You develop new leg swelling

## 2016-06-15 NOTE — ED Notes (Signed)
Provider stated give morphine first and evaluate response if decreases hold nitro.

## 2016-06-17 ENCOUNTER — Other Ambulatory Visit (HOSPITAL_COMMUNITY): Payer: Self-pay | Admitting: Nurse Practitioner

## 2016-06-17 DIAGNOSIS — N2889 Other specified disorders of kidney and ureter: Secondary | ICD-10-CM

## 2016-06-26 ENCOUNTER — Ambulatory Visit (HOSPITAL_COMMUNITY)
Admission: RE | Admit: 2016-06-26 | Discharge: 2016-06-26 | Disposition: A | Discharge status: Home / Self Care | Payer: 59 | Source: Ambulatory Visit | Attending: Nurse Practitioner | Admitting: Nurse Practitioner

## 2016-06-26 DIAGNOSIS — N400 Enlarged prostate without lower urinary tract symptoms: Secondary | ICD-10-CM | POA: Insufficient documentation

## 2016-06-26 DIAGNOSIS — N281 Cyst of kidney, acquired: Secondary | ICD-10-CM | POA: Insufficient documentation

## 2016-06-26 DIAGNOSIS — I700 Atherosclerosis of aorta: Secondary | ICD-10-CM | POA: Diagnosis not present

## 2016-06-26 DIAGNOSIS — N2889 Other specified disorders of kidney and ureter: Secondary | ICD-10-CM

## 2016-06-26 DIAGNOSIS — J90 Pleural effusion, not elsewhere classified: Secondary | ICD-10-CM | POA: Diagnosis not present

## 2016-06-26 DIAGNOSIS — I2510 Atherosclerotic heart disease of native coronary artery without angina pectoris: Secondary | ICD-10-CM | POA: Diagnosis not present

## 2016-06-26 MED ORDER — IOPAMIDOL (ISOVUE-300) INJECTION 61%
100.0000 mL | Freq: Once | INTRAVENOUS | Status: AC | PRN
  Administered 2016-06-26: 100 mL via INTRAVENOUS

## 2016-06-30 ENCOUNTER — Other Ambulatory Visit: Payer: Self-pay | Admitting: Surgery

## 2016-06-30 DIAGNOSIS — Z951 Presence of aortocoronary bypass graft: Secondary | ICD-10-CM

## 2016-07-01 ENCOUNTER — Ambulatory Visit (INDEPENDENT_AMBULATORY_CARE_PROVIDER_SITE_OTHER): Payer: Self-pay | Admitting: Surgery

## 2016-07-01 ENCOUNTER — Encounter: Payer: Self-pay | Admitting: Surgery

## 2016-07-01 ENCOUNTER — Ambulatory Visit
Admission: RE | Admit: 2016-07-01 | Discharge: 2016-07-01 | Disposition: A | Discharge status: Home / Self Care | Payer: 59 | Source: Ambulatory Visit | Attending: Surgery | Admitting: Surgery

## 2016-07-01 VITALS — BP 138/90 | HR 72 | Resp 20 | Ht 68.0 in | Wt 197.0 lb

## 2016-07-01 DIAGNOSIS — Z951 Presence of aortocoronary bypass graft: Secondary | ICD-10-CM

## 2016-07-03 ENCOUNTER — Encounter: Payer: Self-pay | Admitting: Surgery

## 2016-07-03 NOTE — Progress Notes (Signed)
Thanks, Bryan! Paityn Balsam 

## 2016-07-03 NOTE — Progress Notes (Signed)
      HPI: Patient returns for routine postoperative follow-up having undergone CABG x 3 using bilateral IMA grafts and a left radial artery graft on 05/29/2016. The patient's early postoperative recovery while in the hospital was notable for an uncomplicated postop course. Since hospital discharge the patient reports that he has been feeling well. He did come into the ER on 06/15/2016 with some right sided chest pain after he tried to grab something with his right arm out to the side and it hurt. He though he might have pulled his chest apart. Workup was negative and the pain resolved. 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 He is walking without chest pain or shortness of breath. He has abstained from smoking since surgery.   Current Outpatient Prescriptions  Medication Sig Dispense Refill  . aspirin EC 325 MG EC tablet Take 1 tablet (325 mg total) by mouth daily. 30 tablet 0  . isosorbide mononitrate (IMDUR) 30 MG 24 hr tablet Take 0.5 tablets (15 mg total) by mouth daily. 30 tablet 0  . metoprolol tartrate (LOPRESSOR) 25 MG tablet Take 0.5 tablets (12.5 mg total) by mouth 2 (two) times daily. 60 tablet 3  . Multiple Vitamin (MULTIVITAMIN WITH MINERALS) TABS tablet Take 1 tablet by mouth daily.    . pantoprazole (PROTONIX) 40 MG tablet Take 1 tablet (40 mg total) by mouth daily. 30 tablet 3   No current facility-administered medications for this visit.     Physical Exam: BP 138/90   Pulse 72   Resp 20   Ht 5\' 8"  (1.727 m)   Wt 197 lb (89.4 kg)   SpO2 98% Comment: RA  BMI 29.95 kg/m  He looks well. Lung exam is clear. Cardiac exam shows a regular rate and rhythm with normal heart sounds. Chest incision is healing well and sternum is stable. The left arm incision is healing well and the hand is neurovascularly intact.   Diagnostic Tests:  CLINICAL DATA:  Post CABG  EXAM: CHEST  2 VIEW  COMPARISON:   06/15/2016  FINDINGS: Prior CABG. Heart is normal size. Linear scarring or atelectasis in the lingula. No confluent opacity on the right. No effusions or acute bony abnormality.  IMPRESSION: Lingular scarring or atelectasis.  No active disease.   Electronically Signed   By: Charlett NoseKevin  Dover M.D.   On: 07/01/2016 11:52   Impression:  Overall I think he is doing well. I encouraged him to continue walking. He is not planning to participate in cardiac rehab because he says that he doesn't have time with work.  I told him he could drive his car ( he has been driving since he went home)  but should not lift anything heavier than 10 lbs for three months postop.   Plan:  He will continue to follow up with Dr. Royann Shiversroitoru and his PCP and will contact me if he has any problems with his incisions.   Alleen BorneBryan K Bartle, MD [facility] [phone]

## 2016-07-20 ENCOUNTER — Encounter: Payer: Self-pay | Admitting: Cardiovascular Disease

## 2016-07-20 ENCOUNTER — Ambulatory Visit (INDEPENDENT_AMBULATORY_CARE_PROVIDER_SITE_OTHER): Payer: 59 | Admitting: Cardiovascular Disease

## 2016-07-20 VITALS — BP 115/80 | HR 58 | Ht 68.0 in | Wt 198.8 lb

## 2016-07-20 DIAGNOSIS — Z951 Presence of aortocoronary bypass graft: Secondary | ICD-10-CM | POA: Diagnosis not present

## 2016-07-20 DIAGNOSIS — E785 Hyperlipidemia, unspecified: Secondary | ICD-10-CM

## 2016-07-20 DIAGNOSIS — E669 Obesity, unspecified: Secondary | ICD-10-CM

## 2016-07-20 DIAGNOSIS — Z79899 Other long term (current) drug therapy: Secondary | ICD-10-CM | POA: Diagnosis not present

## 2016-07-20 DIAGNOSIS — I25810 Atherosclerosis of coronary artery bypass graft(s) without angina pectoris: Secondary | ICD-10-CM

## 2016-07-20 MED ORDER — ASPIRIN EC 81 MG PO TBEC: 81.0000 mg | DELAYED_RELEASE_TABLET | Freq: Every day | ORAL | 3 refills | Status: DC

## 2016-07-20 MED ORDER — ROSUVASTATIN CALCIUM 10 MG PO TABS: 10.0000 mg | ORAL_TABLET | Freq: Every day | ORAL | 3 refills | Status: DC

## 2016-07-20 NOTE — Progress Notes (Signed)
Cardiology Office Note    Date:  07/20/2016   ID:  Dennis Winters, DOB 07-Sep-1960, MRN [id number]  PCP:  Erasmo Downer, NP  Cardiologist:   Thurmon Fair, MD   Chief Complaint  Patient presents with  . Follow-up    History of Present Illness:  Dennis Winters is a 56 y.o. male with recently diagnosed coronary artery disease, returning for follow-up almost 2 months after undergoing three-vessel bypass surgery (LIMA to LAD, free RIMA to RCA, free left radial to OM). Also has hyperlipidemia, preserved left ventricular systolic function, hypertension with mild LVH. He saw Dr. Laneta Simmers in follow-up on January 10. He has recovered quite well. He has not had any problems with palpitations or other symptoms of arrhythmia. He denies angina or shortness of breath and his sternotomy pain has virtually resolved. He has a little bit of a raised and slightly reddish scar towards the distal aspect of his radial harvest site. He may have a small stitch granuloma, but there is really no sign of infection. He has not smoked since his hospitalization. He has managed to lose about 10 pounds of weight. He has not yet started cardiac rehabilitation but has returned to work.    Past Medical History:  Diagnosis Date  . Coronary artery disease   . High cholesterol   . Hypertension     Past Surgical History:  Procedure Laterality Date  . ANKLE SURGERY    . CARDIAC CATHETERIZATION N/A 05/28/2016   Procedure: Left Heart Cath and Coronary Angiography;  Surgeon: Kathleene Hazel, MD;  Location: [facility] INVASIVE CV LAB;  Service: Cardiovascular;  Laterality: N/A;  . CHOLECYSTECTOMY N/A 04/13/2013   Procedure: LAPAROSCOPIC CHOLECYSTECTOMY , attempted IOC.;  Surgeon: Ernestene Mention, MD;  Location: [facility] ORS;  Service: General;  Laterality: N/A;  . CORONARY ARTERY BYPASS GRAFT    . CORONARY ARTERY BYPASS GRAFT N/A 05/29/2016   Procedure: CORONARY ARTERY BYPASS GRAFTING (CABG) x 3 with bilateral internal  mammary arteries and left radial artery grafting LIMA-LAD FREE RIMA-RCA LEFT RADIAL-OM;  Surgeon: Alleen Borne, MD;  Location: [facility] OR;  Service: Open Heart Surgery;  Laterality: N/A;  . RADIAL ARTERY HARVEST Left 05/29/2016   Procedure: RADIAL ARTERY HARVEST;  Surgeon: Alleen Borne, MD;  Location: [facility] OR;  Service: Open Heart Surgery;  Laterality: Left;  . TEE WITHOUT CARDIOVERSION N/A 05/29/2016   Procedure: TRANSESOPHAGEAL ECHOCARDIOGRAM (TEE);  Surgeon: Alleen Borne, MD;  Location: [facility] OR;  Service: Open Heart Surgery;  Laterality: N/A;    Current Medications: Outpatient Medications Prior to Visit  Medication Sig Dispense Refill  . metoprolol tartrate (LOPRESSOR) 25 MG tablet Take 0.5 tablets (12.5 mg total) by mouth 2 (two) times daily. 60 tablet 3  . Multiple Vitamin (MULTIVITAMIN WITH MINERALS) TABS tablet Take 1 tablet by mouth daily.    . pantoprazole (PROTONIX) 40 MG tablet Take 1 tablet (40 mg total) by mouth daily. 30 tablet 3  . aspirin EC 325 MG EC tablet Take 1 tablet (325 mg total) by mouth daily. 30 tablet 0  . isosorbide mononitrate (IMDUR) 30 MG 24 hr tablet Take 0.5 tablets (15 mg total) by mouth daily. (Patient not taking: Reported on 07/20/2016) 30 tablet 0   No facility-administered medications prior to visit.      Allergies:   Lipitor [atorvastatin]; Penicillins; and Zocor [simvastatin]   Social History   Social History  . Marital status: Single    Spouse name: N/A  . Number of children: N/A  .  Years of education: N/A   Occupational History  . 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 Smoking status: Former Smoker    Packs/day: 0.50  . Smokeless tobacco: Never Used  . Alcohol use 3.6 oz/week    6 Cans of beer per week  . Drug use: No  . Sexual activity: Not Asked   Other Topics Concern  . None   Social History Narrative   Pt lives with son.      Family History:  The patient's family history  includes Pulmonary disease in his maternal aunt.   ROS:   Please see the history of present illness.    ROS All other systems reviewed and are negative.   PHYSICAL EXAM:   VS:  BP 115/80 (BP Location: Right Arm, Patient Position: Sitting, Cuff Size: Normal)   Pulse (!) 58   Ht 5\' 8"  (1.727 m)   Wt 90.2 kg (198 lb 12.8 oz)   SpO2 98%   BMI 30.23 kg/m    GEN: Well nourished, well developed, in no acute distress  HEENT: normal  Neck: no JVD, carotid bruits, or masses Cardiac: RRR; no murmurs, rubs, or gallops,no edema , well-healed sternotomy scar Respiratory:  clear to auscultation bilaterally, normal work of breathing GI: soft, nontender, nondistended, + BS MS: no deformity or atrophy  Skin: warm and dry, no rash; scar at radial harvest site is mostly healed with a small raised/reddish/keloid segment at its distalmost and Neuro:  Alert and Oriented x 3, Strength and sensation are intact Psych: euthymic mood, full affect  Wt Readings from Last 3 Encounters:  07/20/16 90.2 kg (198 lb 12.8 oz)  07/01/16 89.4 kg (197 lb)  06/15/16 89.4 kg (197 lb)      Studies/Labs Reviewed:   EKG:  EKG is not ordered today.    Recent Labs: 05/30/2016: Magnesium 2.2 06/15/2016: ALT 19; BUN <5; Creatinine, Ser 1.07; Hemoglobin 13.0; Platelets 805; Potassium 4.2; Sodium 139     ASSESSMENT:    1. Coronary artery disease involving coronary bypass graft of native heart without angina pectoris   2. S/P CABG x 3   3. Dyslipidemia   4. Mild obesity   5. Medication management      PLAN:  In order of problems listed above:  1. CAD s/p CABG: Recovering very well. Refer to cardiac rehabilitation. Suspect he has a small stitch granuloma at the radial harvest site that will resolve. 2. HLP: Previously felt "bad" without being able to provide more details while on simvastatin and atorvastatin. We'll try Crestor instead. Recheck in 3 months 3. Obesity: He is made excellent progress with improving  his diet and has lost substantial weight. Target 34 inch waist and another 10 pound weight loss in the next 3-6 months.    Medication Adjustments/Labs and Tests Ordered: Current medicines are reviewed at length with the patient today.  Concerns regarding medicines are outlined above.  Medication changes, Labs and Tests ordered today are listed in the Patient Instructions below. Patient Instructions  Medication Instructions: Dr Royann Shivers has recommended making the following medication changes: 1. DECREASE Aspirin to 81 mg daily 2. START Rosuvastatin 10 mg - take 1 tablet by mouth daily  Labwork: Your physician recommends that you return for lab work in 3 months - FASTING.  Testing/Procedures: NONE ORDERED  Follow-up: Dr Royann Shivers recommends that you schedule a follow-up appointment in 3 months.  If you need a refill  on your cardiac medications before your next appointment, please call your pharmacy.   You have been referred to Cardiac Rehab. They will contact you to set this up.    Signed, Thurmon FairMihai Laiklyn Pilkenton, MD  07/20/2016 1:46 PM    [facility] [address], [place], [place]  [address] Phone: [phone]; Fax: [phone]

## 2016-07-20 NOTE — Patient Instructions (Addendum)
Medication Instructions: Dr Royann Shiversroitoru has recommended making the following medication changes: 1. DECREASE Aspirin to 81 mg daily 2. START Rosuvastatin 10 mg - take 1 tablet by mouth daily  Labwork: Your physician recommends that you return for lab work in 3 months - FASTING.  Testing/Procedures: NONE ORDERED  Follow-up: Dr Royann Shiversroitoru recommends that you schedule a follow-up appointment in 3 months.  If you need a refill on your cardiac medications before your next appointment, please call your pharmacy.   You have been referred to Cardiac Rehab. They will contact you to set this up.

## 2016-10-30 ENCOUNTER — Ambulatory Visit: Payer: 59 | Admitting: Cardiovascular Disease

## 2016-11-06 ENCOUNTER — Other Ambulatory Visit: Payer: Self-pay | Admitting: Physician Assistant

## 2017-03-09 ENCOUNTER — Other Ambulatory Visit (HOSPITAL_COMMUNITY): Payer: Self-pay | Admitting: Nurse Practitioner

## 2017-03-09 DIAGNOSIS — N281 Cyst of kidney, acquired: Secondary | ICD-10-CM

## 2017-03-25 ENCOUNTER — Ambulatory Visit (HOSPITAL_COMMUNITY)
Admission: RE | Admit: 2017-03-25 | Discharge: 2017-03-25 | Disposition: A | Discharge status: Home / Self Care | Payer: 59 | Source: Ambulatory Visit | Attending: Nurse Practitioner | Admitting: Nurse Practitioner

## 2017-03-25 DIAGNOSIS — N281 Cyst of kidney, acquired: Secondary | ICD-10-CM | POA: Insufficient documentation

## 2017-03-25 DIAGNOSIS — I700 Atherosclerosis of aorta: Secondary | ICD-10-CM | POA: Diagnosis not present

## 2017-03-25 DIAGNOSIS — I2510 Atherosclerotic heart disease of native coronary artery without angina pectoris: Secondary | ICD-10-CM | POA: Insufficient documentation

## 2017-03-25 DIAGNOSIS — K760 Fatty (change of) liver, not elsewhere classified: Secondary | ICD-10-CM | POA: Insufficient documentation

## 2017-03-25 MED ORDER — IOPAMIDOL (ISOVUE-300) INJECTION 61%
100.0000 mL | Freq: Once | INTRAVENOUS | Status: AC | PRN
  Administered 2017-03-25: 100 mL via INTRAVENOUS

## 2017-05-05 IMAGING — CR DG CHEST 2V
2 series · 2 of 2 positions shown · non-contrast
Comparison: 06/15/2016

CLINICAL DATA: Post CABG

EXAM:
CHEST  2 VIEW

[w chest pa]
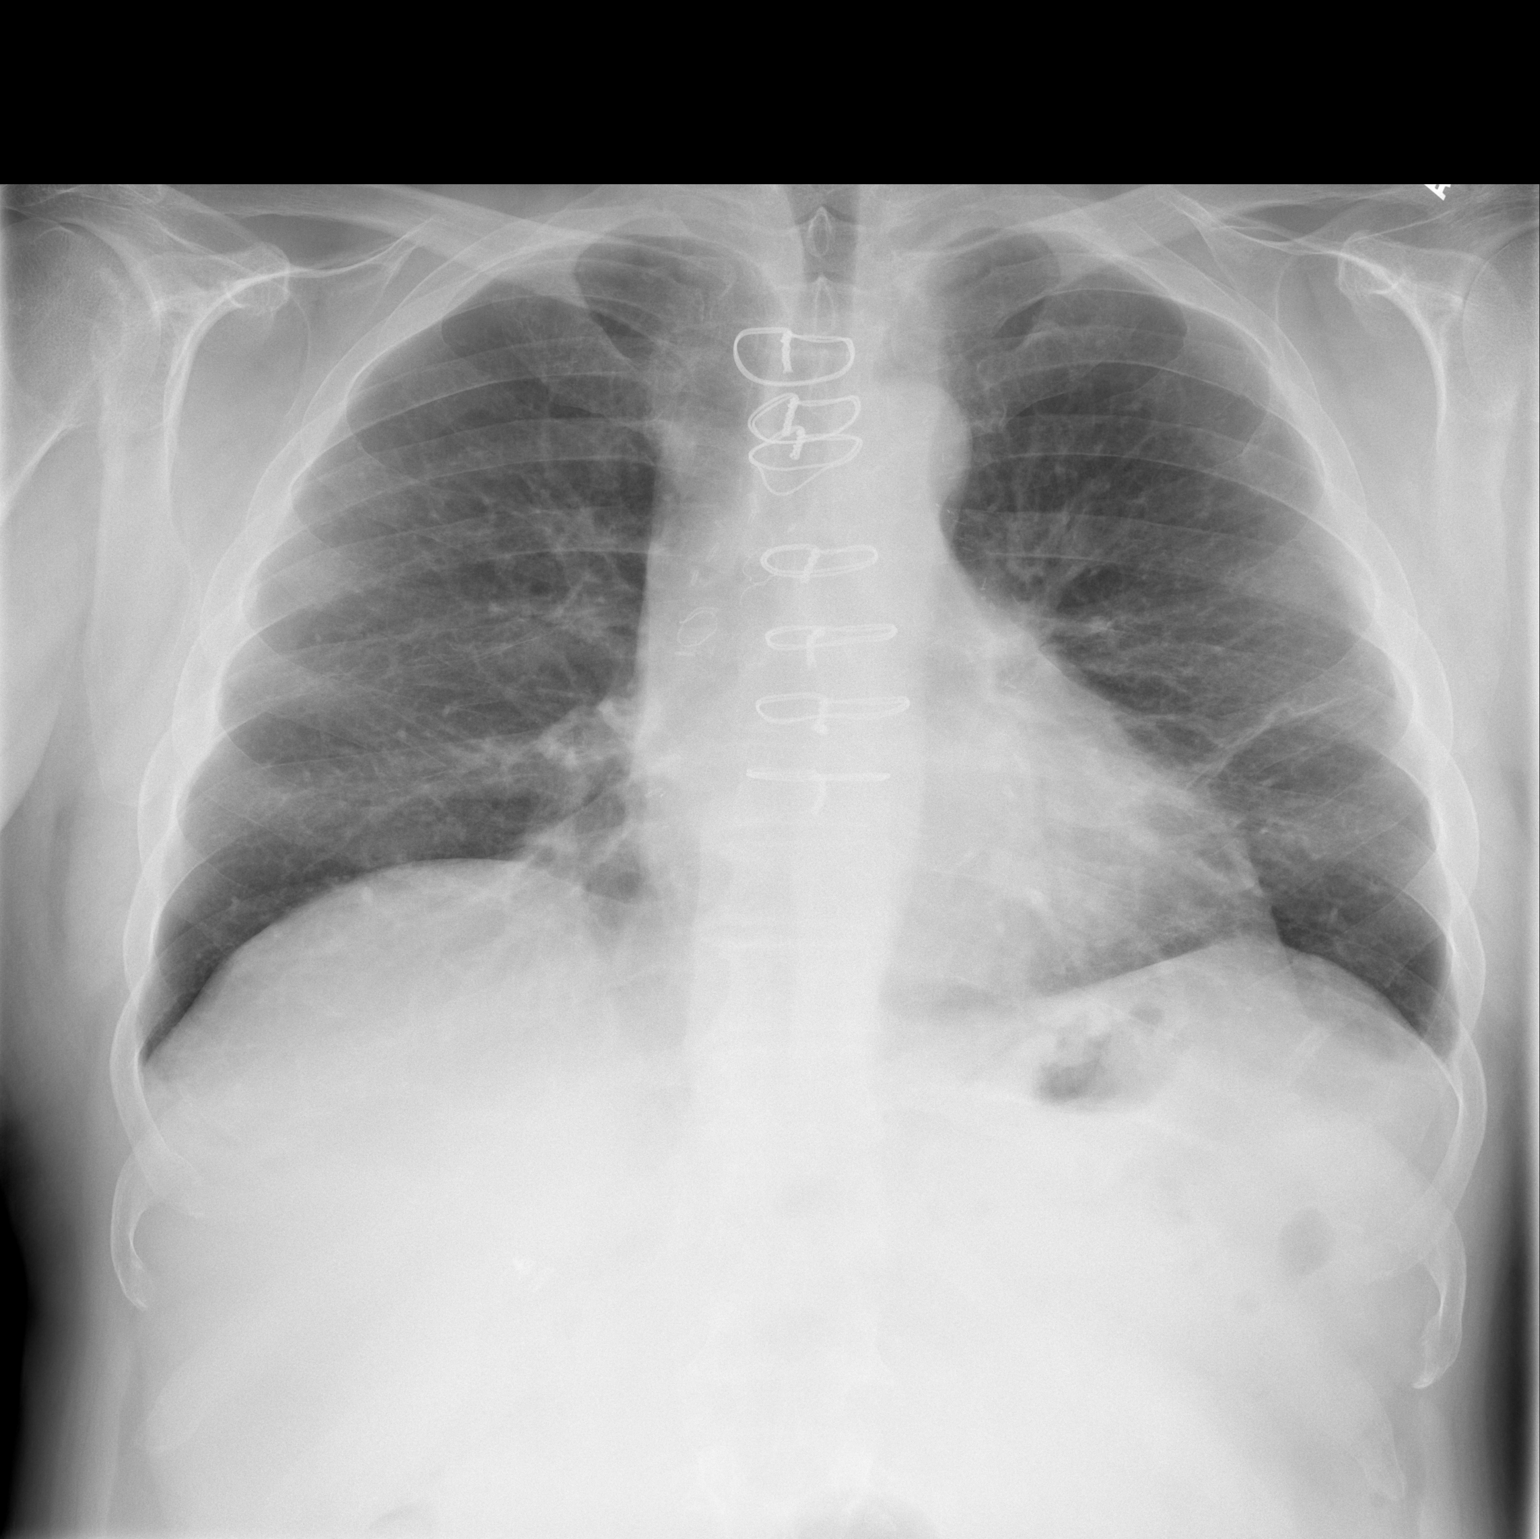

[w chest lat]
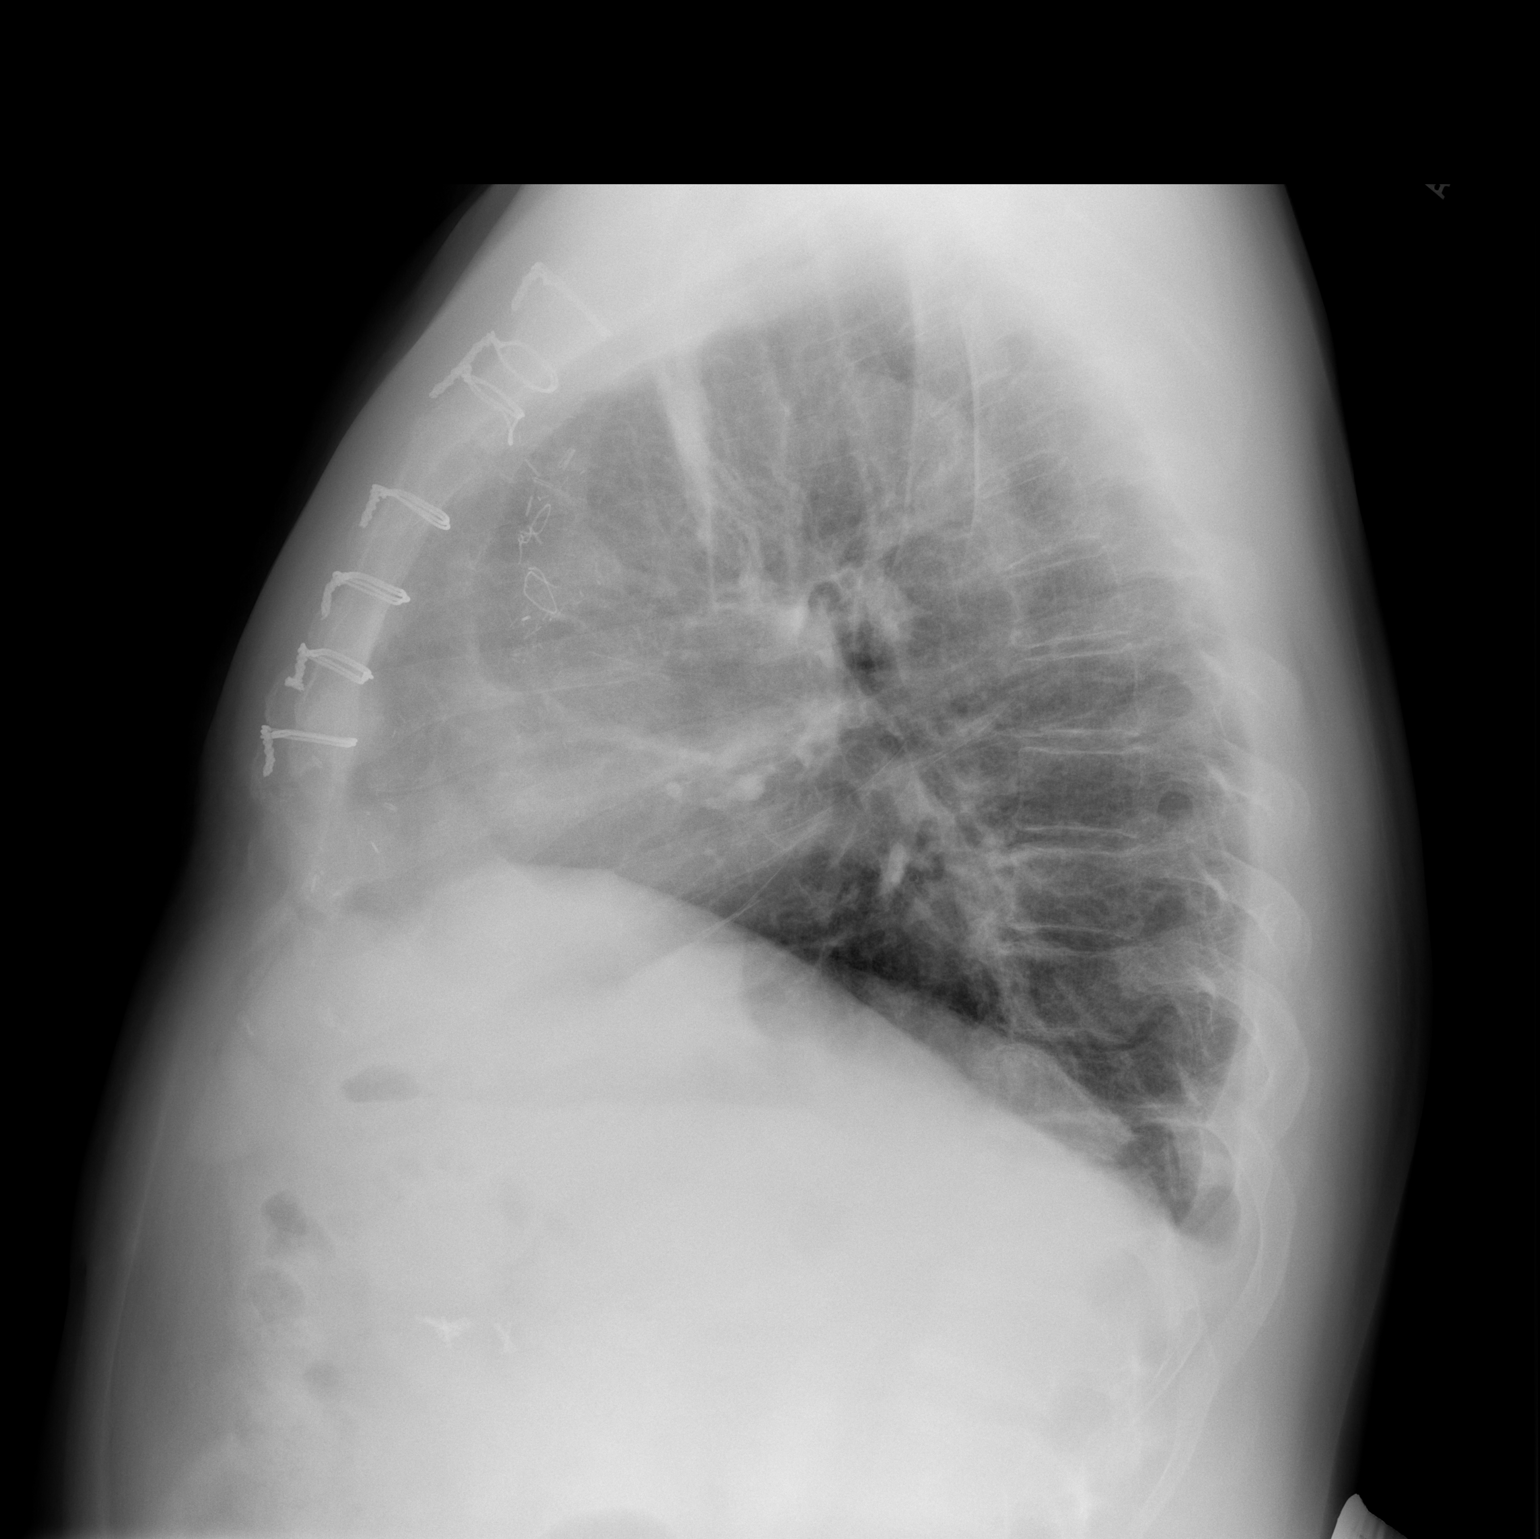

[2 of 2 positions shown; findings below may reference images not displayed]

FINDINGS: Prior CABG. Heart is normal size. Linear scarring or atelectasis in
the lingula. No confluent opacity on the right. No effusions or
acute bony abnormality.
IMPRESSION: Lingular scarring or atelectasis.  No active disease.

## 2017-10-22 ENCOUNTER — Telehealth: Payer: Self-pay | Admitting: Cardiovascular Disease

## 2017-10-22 NOTE — Telephone Encounter (Signed)
Called patient on his number listed as the incoming call was from Salvatore Decent who is not on patient's DPR. Was unable to leave a message on patient's VM as the mailbox is full.

## 2017-10-22 NOTE — Telephone Encounter (Signed)
New Message:      Pt states that a place in his incision is hurting in his chest and would like to see Dr. Salena Saner before 12/31/17 appt. I offered a PA but pt refused.

## 2017-10-28 NOTE — Telephone Encounter (Signed)
Spoke with pt, he reports redness, swelling and pain in his incision site from cabg. There is no drainage. He has seen a dermatologist and they feel there maybe some kind of infectious process causing the problem and they told him to be seen. He denies fever. He reports it is about the same when he saw dr croitoru in January. He is requesting to be seen, Follow up scheduled.

## 2017-11-01 ENCOUNTER — Ambulatory Visit: Payer: 59 | Admitting: Cardiology

## 2017-11-01 ENCOUNTER — Encounter: Payer: Self-pay | Admitting: Cardiology

## 2017-11-01 ENCOUNTER — Ambulatory Visit (INDEPENDENT_AMBULATORY_CARE_PROVIDER_SITE_OTHER): Payer: 59 | Admitting: Cardiology

## 2017-11-01 ENCOUNTER — Telehealth: Payer: Self-pay

## 2017-11-01 VITALS — BP 122/81 | HR 63 | Ht 68.0 in | Wt 212.0 lb

## 2017-11-01 DIAGNOSIS — R079 Chest pain, unspecified: Secondary | ICD-10-CM | POA: Diagnosis not present

## 2017-11-01 MED ORDER — NITROGLYCERIN 0.4 MG SL SUBL: 0.4000 mg | SUBLINGUAL_TABLET | SUBLINGUAL | 3 refills | Status: DC | PRN

## 2017-11-01 NOTE — Assessment & Plan Note (Signed)
Controlled.  

## 2017-11-01 NOTE — Telephone Encounter (Signed)
Per Dennis Winters I called patient to see if he could come in earlier since we have a opening at 9 and 930. Unable to leave voicemail mailbox full.

## 2017-11-01 NOTE — Assessment & Plan Note (Signed)
Im concerned his symptoms are from angina- check GXT Myoview

## 2017-11-01 NOTE — Assessment & Plan Note (Signed)
Dec 2017

## 2017-11-01 NOTE — Patient Instructions (Signed)
Medication Instructions:  TAKE Nitrostat as needed for emergency chest pain  Labwork: None   Testing/Procedures: Your physician has requested that you have an exercise stress myoview. For further information please visit [email or web address] Please follow instruction sheet, as given.  Follow-Up: Keep follow up as scheduled with Dr Royann Shivers in July  Any Other Special Instructions Will Be Listed Below (If Applicable). If you need a refill on your cardiac medications before your next appointment, please call your pharmacy.

## 2017-11-01 NOTE — Progress Notes (Signed)
11/01/2017 Dennis Winters   [birth year]  [id number]  Primary Physician Erasmo Downer, NP Primary Cardiologist: Dr Royann Shivers  HPI:  57 y/o male from [place], s/p CABG x 3 in Dec 2017. 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 "I feel like II ought to be able to do more". He mentioned this to his dermatologist (excema) and she suggested he call us. The pt though he might have "an infection in my chest" or that his "wires were too tight". He denies any radiation his his jaw or arms, no associated nausea or SOB. He has not smoked since his CABG.    Current Outpatient Medications  Medication Sig Dispense Refill  . aspirin EC 81 MG tablet Take 1 tablet (81 mg total) by mouth daily. 90 tablet 3  . Cholecalciferol ([employer] VIT D 5000 HIGH-POTENCY) 5000 units capsule Take 5,000 Units by mouth daily.    . metoprolol tartrate (LOPRESSOR) 25 MG tablet Take 0.5 tablets (12.5 mg total) by mouth 2 (two) times daily. 60 tablet 3  . Multiple Vitamin (MULTIVITAMIN WITH MINERALS) TABS tablet Take 1 tablet by mouth daily.    . pantoprazole (PROTONIX) 40 MG tablet Take 1 tablet (40 mg total) by mouth daily. 30 tablet 3  . rosuvastatin (CRESTOR) 20 MG tablet Take 20 mg by mouth daily.     No current facility-administered medications for this visit.     Allergies  Allergen Reactions  . Lipitor [Atorvastatin] Other (See Comments)    Myalgia   . Penicillins Rash    Has patient had a PCN reaction causing immediate rash, facial/tongue/throat swelling, SOB or lightheadedness with hypotension: Yes Has patient had a PCN reaction causing severe rash involving mucus membranes or skin necrosis: Yes Has patient had a PCN reaction that required hospitalization No Has patient had a PCN reaction occurring within the last 10 years: Non  If all of the above answers are "NO", then may proceed with Cephalosporin use.   . Zocor [Simvastatin] Other (See Comments)    Myalgias    Past  Medical History:  Diagnosis Date  . Coronary artery disease   . High cholesterol   . Hypertension     Social History   Socioeconomic History  . Marital status: Divorced    Spouse name: Not on file  . Number of children: Not on file  . Years of education: Not on file  . Highest education level: Not on file  Occupational History  . 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 Financial resource strain: Not on file  . Food insecurity:    Worry: Not on file    Inability: Not on file  . Transportation needs:    Medical: Not on file    Non-medical: Not on file  Tobacco Use  . Smoking status: Former Smoker    Packs/day: 0.50  . Smokeless tobacco: Never Used  Substance and Sexual Activity  . Alcohol use: Yes    Alcohol/week: 3.6 oz    Types: 6 Cans of beer per week  . Drug use: No  . Sexual activity: Not on file  Lifestyle  . Physical activity:    Days per week: Not on file    Minutes per session: Not on file  . Stress: Not on file  Relationships  . Social connections:    Talks on phone: Not on file    Gets together: Not on file  Attends religious service: Not on file    Active member of club or organization: Not on file    Attends meetings of clubs or organizations: Not on file    Relationship status: Not on file  . Intimate partner violence:    Fear of current or ex partner: Not on file    Emotionally abused: Not on file    Physically abused: Not on file    Forced sexual activity: Not on file  Other Topics Concern  . Not on file  Social History Narrative   Pt lives with son.      Family History  Problem Relation Age of Onset  . Pulmonary disease Maternal Aunt   . CAD Neg Hx     Review of Systems: General: negative for chills, fever, night sweats or weight changes.  Cardiovascular: negative for chest pain, dyspnea on exertion, edema, orthopnea, palpitations, paroxysmal nocturnal dyspnea or shortness of  breath Dermatological: negative for rash Respiratory: negative for cough or wheezing Urologic: negative for hematuria Abdominal: negative for nausea, vomiting, diarrhea, bright red blood per rectum, melena, or hematemesis Neurologic: negative for visual changes, syncope, or dizziness All other systems reviewed and are otherwise negative except as noted above.   Blood pressure 122/81, pulse 63, height  (1.727 m), weight 212 lb (96.2 kg).  General appearance: alert, cooperative and no distress Neck: no carotid bruit and no JVD Lungs: clear to auscultation bilaterally Heart: regular rate and rhythm Extremities: extremities normal, atraumatic, no cyanosis or edema Skin: Skin color, texture, turgor normal. No rashes or lesions Neurologic: Grossly normal  EKG NSR- no acute changes  ASSESSMENT AND PLAN:   Chest pain with moderate risk of acute coronary syndrome Im concerned his symptoms are from angina- check GXT Myoview  S/P CABG x 24 May 2016  Essential hypertension Controlled  High cholesterol Followed by PCP   PLAN GXT Myoview- SL NTG PRN  Corine Shelter PA-C 11/01/2017 2:35 PM

## 2017-11-01 NOTE — Assessment & Plan Note (Signed)
Followed by PCP

## 2017-11-10 ENCOUNTER — Telehealth (HOSPITAL_COMMUNITY): Payer: Self-pay

## 2017-11-10 NOTE — Telephone Encounter (Signed)
Encounter complete. 

## 2017-11-12 ENCOUNTER — Ambulatory Visit (HOSPITAL_COMMUNITY)
Admission: RE | Admit: 2017-11-12 | Discharge: 2017-11-12 | Disposition: A | Discharge status: Home / Self Care | Payer: 59 | Source: Ambulatory Visit | Attending: Cardiology | Admitting: Cardiology

## 2017-11-12 DIAGNOSIS — R079 Chest pain, unspecified: Secondary | ICD-10-CM | POA: Diagnosis not present

## 2017-11-12 LAB — MYOCARDIAL PERFUSION IMAGING
Estimated workload: 10.1 METS
Exercise duration (min): 8 min
Exercise duration (sec): 0 s
LV dias vol: 93 mL (ref 62–150)
LV sys vol: 38 mL
MPHR: 165 {beats}/min
Peak HR: 150 {beats}/min
Percent HR: 90 %
RPE: 17
Rest HR: 58 {beats}/min
SDS: 3
SRS: 0
SSS: 3
TID: 0.9

## 2017-11-12 IMAGING — NM NM MISC PROCEDURE
9 series · 54 of 54 positions shown · non-contrast
Comparison: none

[Series 1: rest sax · 6.4mm · 6.40mm/px · 6 of 23 frames shown]
[frame 2/23]
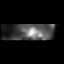
[frame 6/23]
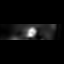
[frame 10/23]
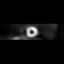
[frame 14/23]
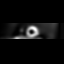
[frame 18/23]
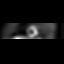
[frame 22/23]
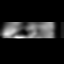

[Series 1: wbr rest · 6.40mm/px · 6 of 63 frames shown]
[frame 6/63  full-range]
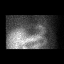
[frame 16/63  full-range]
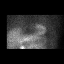
[frame 27/63  full-range]
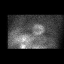
[frame 37/63  full-range]
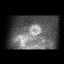
[frame 48/63  full-range]
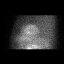
[frame 58/63  full-range]
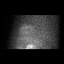

[Series 1: wbr_r-proj_st wbr rest · 6.40mm/px · 6 of 64 frames shown]
[frame 6/64]
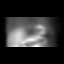
[frame 16/64]
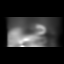
[frame 27/64]
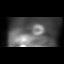
[frame 38/64]
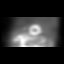
[frame 48/64]
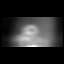
[frame 59/64]
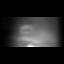

[Series 2: wbr stress-gsp · 6.40mm/px · 6 of 485 frames shown]
[frame 41/485  full-range]
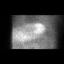
[frame 121/485  full-range]
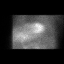
[frame 202/485  full-range]
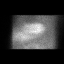
[frame 283/485  full-range]
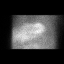
[frame 364/485  full-range]
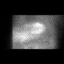
[frame 445/485  full-range]
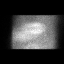

[Series 2: stress sax gs · 6.4mm · 6.40mm/px · 6 of 184 frames shown]
[frame 16/184]
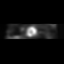
[frame 46/184]
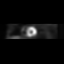
[frame 77/184]
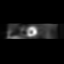
[frame 108/184]
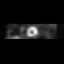
[frame 138/184]
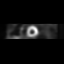
[frame 169/184]
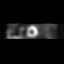

[Series 2: wbr_s-proj_st wbr stress-gsp · 6.40mm/px · 6 of 512 frames shown]
[frame 43/512]
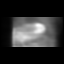
[frame 128/512]
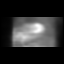
[frame 214/512]
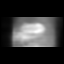
[frame 299/512]
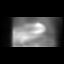
[frame 384/512]
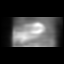
[frame 470/512]
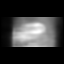

[Series 3: wbr_s-proj_st wbr stress-sum-em · 6.40mm/px · 6 of 64 frames shown]
[frame 6/64]
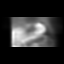
[frame 16/64]
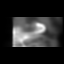
[frame 27/64]
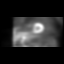
[frame 38/64]
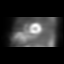
[frame 48/64]
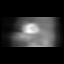
[frame 59/64]
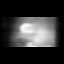

[Series 3: stress sax · 6.4mm · 6.40mm/px · 6 of 23 frames shown]
[frame 2/23]
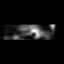
[frame 6/23]
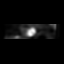
[frame 10/23]
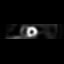
[frame 14/23]
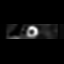
[frame 18/23]
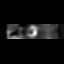
[frame 22/23]
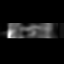

[Series 3: wbr stress-sum-em · 6.40mm/px · 6 of 64 frames shown]
[frame 6/64]
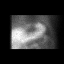
[frame 16/64]
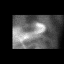
[frame 27/64]
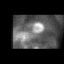
[frame 38/64]
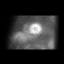
[frame 48/64]
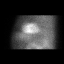
[frame 59/64]
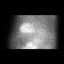

[54 of 54 positions shown; findings below may reference images not displayed]

Canned report from images found in remote index.

Refer to host system for actual result text.

## 2017-11-12 MED ORDER — TECHNETIUM TC 99M TETROFOSMIN IV KIT
1340.0000 | PACK | Freq: Once | INTRAVENOUS | Status: DC | PRN
  Filled 2017-11-12: qty 1340

## 2017-11-12 MED ORDER — TECHNETIUM TC 99M TETROFOSMIN IV KIT
10.4000 | PACK | Freq: Once | INTRAVENOUS | Status: AC | PRN
  Administered 2017-11-12: 10.4 via INTRAVENOUS
  Filled 2017-11-12: qty 11

## 2017-11-12 MED ORDER — TECHNETIUM TC 99M TETROFOSMIN IV KIT
32.0000 | PACK | Freq: Once | INTRAVENOUS | Status: AC | PRN
  Administered 2017-11-12: 32 via INTRAVENOUS
  Filled 2017-11-12: qty 32

## 2017-11-18 ENCOUNTER — Other Ambulatory Visit: Payer: Self-pay

## 2017-11-18 MED ORDER — ISOSORBIDE MONONITRATE ER 30 MG PO TB24: 30.0000 mg | ORAL_TABLET | Freq: Every day | ORAL | 2 refills | Status: DC

## 2017-11-20 ENCOUNTER — Emergency Department (HOSPITAL_COMMUNITY): Payer: 59

## 2017-11-20 ENCOUNTER — Other Ambulatory Visit: Payer: Self-pay

## 2017-11-20 ENCOUNTER — Observation Stay (HOSPITAL_COMMUNITY)
Admission: EM | Admit: 2017-11-20 | Discharge: 2017-11-22 | Disposition: A | Discharge status: Home / Self Care | Payer: Self-pay | Attending: Internal Medicine | Admitting: Internal Medicine

## 2017-11-20 ENCOUNTER — Encounter (HOSPITAL_COMMUNITY): Payer: Self-pay | Admitting: Emergency Medicine

## 2017-11-20 DIAGNOSIS — Z7289 Other problems related to lifestyle: Secondary | ICD-10-CM | POA: Diagnosis present

## 2017-11-20 DIAGNOSIS — Z87891 Personal history of nicotine dependence: Secondary | ICD-10-CM | POA: Insufficient documentation

## 2017-11-20 DIAGNOSIS — I2510 Atherosclerotic heart disease of native coronary artery without angina pectoris: Secondary | ICD-10-CM | POA: Insufficient documentation

## 2017-11-20 DIAGNOSIS — R55 Syncope and collapse: Secondary | ICD-10-CM | POA: Insufficient documentation

## 2017-11-20 DIAGNOSIS — R079 Chest pain, unspecified: Principal | ICD-10-CM | POA: Insufficient documentation

## 2017-11-20 DIAGNOSIS — Z9049 Acquired absence of other specified parts of digestive tract: Secondary | ICD-10-CM | POA: Insufficient documentation

## 2017-11-20 DIAGNOSIS — Z951 Presence of aortocoronary bypass graft: Secondary | ICD-10-CM | POA: Insufficient documentation

## 2017-11-20 DIAGNOSIS — Z7982 Long term (current) use of aspirin: Secondary | ICD-10-CM | POA: Insufficient documentation

## 2017-11-20 DIAGNOSIS — I10 Essential (primary) hypertension: Secondary | ICD-10-CM | POA: Insufficient documentation

## 2017-11-20 DIAGNOSIS — E7800 Pure hypercholesterolemia, unspecified: Secondary | ICD-10-CM | POA: Diagnosis present

## 2017-11-20 DIAGNOSIS — Z789 Other specified health status: Secondary | ICD-10-CM | POA: Diagnosis present

## 2017-11-20 DIAGNOSIS — E782 Mixed hyperlipidemia: Secondary | ICD-10-CM | POA: Diagnosis present

## 2017-11-20 DIAGNOSIS — F1010 Alcohol abuse, uncomplicated: Secondary | ICD-10-CM | POA: Insufficient documentation

## 2017-11-20 DIAGNOSIS — I252 Old myocardial infarction: Secondary | ICD-10-CM | POA: Insufficient documentation

## 2017-11-20 DIAGNOSIS — Z79899 Other long term (current) drug therapy: Secondary | ICD-10-CM | POA: Insufficient documentation

## 2017-11-20 LAB — BASIC METABOLIC PANEL
Anion gap: 11 (ref 5–15)
BUN: 5 mg/dL — ABNORMAL LOW (ref 6–20)
CALCIUM: 8.9 mg/dL (ref 8.9–10.3)
CO2: 22 mmol/L (ref 22–32)
CREATININE: 0.92 mg/dL (ref 0.61–1.24)
Chloride: 105 mmol/L (ref 101–111)
GFR calc non Af Amer: >60 mL/min (ref >60)
Glucose, Bld: 121 mg/dL — ABNORMAL HIGH (ref 65–99)
Potassium: 3.6 mmol/L (ref 3.5–5.1)
Sodium: 138 mmol/L (ref 135–145)

## 2017-11-20 LAB — CBC
HCT: 42.1 % (ref 39.0–52.0)
Hemoglobin: 14.2 g/dL (ref 13.0–17.0)
MCH: 30.8 pg (ref 26.0–34.0)
MCHC: 33.7 g/dL (ref 30.0–36.0)
MCV: 91.3 fL (ref 78.0–100.0)
Platelets: 317 10*3/uL (ref 150–400)
RBC: 4.61 MIL/uL (ref 4.22–5.81)
RDW: 11.9 % (ref 11.5–15.5)
WBC: 5.8 10*3/uL (ref 4.0–10.5)

## 2017-11-20 LAB — I-STAT TROPONIN, ED: Troponin i, poc: 0 ng/mL (ref 0.00–0.08)

## 2017-11-20 NOTE — ED Triage Notes (Signed)
Reports having a stress test on Friday for chest pain.  Reports being started on imdur and pain is worse.  Reports waking up on the ground from the tractor.  Hx of triple bypass.

## 2017-11-21 ENCOUNTER — Observation Stay (HOSPITAL_COMMUNITY): Payer: 59

## 2017-11-21 ENCOUNTER — Other Ambulatory Visit: Payer: Self-pay

## 2017-11-21 DIAGNOSIS — R55 Syncope and collapse: Secondary | ICD-10-CM | POA: Diagnosis present

## 2017-11-21 DIAGNOSIS — I2510 Atherosclerotic heart disease of native coronary artery without angina pectoris: Secondary | ICD-10-CM

## 2017-11-21 DIAGNOSIS — R079 Chest pain, unspecified: Secondary | ICD-10-CM | POA: Diagnosis present

## 2017-11-21 DIAGNOSIS — Z789 Other specified health status: Secondary | ICD-10-CM | POA: Diagnosis present

## 2017-11-21 DIAGNOSIS — F1090 Alcohol use, unspecified, uncomplicated: Secondary | ICD-10-CM | POA: Diagnosis present

## 2017-11-21 DIAGNOSIS — Z7289 Other problems related to lifestyle: Secondary | ICD-10-CM | POA: Diagnosis present

## 2017-11-21 LAB — BASIC METABOLIC PANEL
Anion gap: 12 (ref 5–15)
BUN: 5 mg/dL — ABNORMAL LOW (ref 6–20)
CALCIUM: 8.6 mg/dL — AB (ref 8.9–10.3)
CO2: 22 mmol/L (ref 22–32)
CREATININE: 0.91 mg/dL (ref 0.61–1.24)
Chloride: 105 mmol/L (ref 101–111)
GFR calc non Af Amer: >60 mL/min (ref >60)
GLUCOSE: 97 mg/dL (ref 65–99)
Potassium: 3.6 mmol/L (ref 3.5–5.1)
Sodium: 139 mmol/L (ref 135–145)

## 2017-11-21 LAB — RAPID URINE DRUG SCREEN, HOSP PERFORMED
Amphetamines: NOT DETECTED
BARBITURATES: NOT DETECTED
BENZODIAZEPINES: NOT DETECTED
Cocaine: POSITIVE — AB
Opiates: NOT DETECTED
Tetrahydrocannabinol: NOT DETECTED

## 2017-11-21 LAB — LIPID PANEL
CHOL/HDL RATIO: 5.6 ratio
Cholesterol: 219 mg/dL — ABNORMAL HIGH (ref 0–200)
HDL: 39 mg/dL — ABNORMAL LOW (ref >40)
LDL Cholesterol: UNDETERMINED mg/dL (ref 0–99)
Triglycerides: 464 mg/dL — ABNORMAL HIGH (ref <150)
VLDL: UNDETERMINED mg/dL (ref 0–40)

## 2017-11-21 LAB — ETHANOL: Alcohol, Ethyl (B): 81 mg/dL — ABNORMAL HIGH (ref <10)

## 2017-11-21 LAB — CBC
HCT: 40 % (ref 39.0–52.0)
Hemoglobin: 13.4 g/dL (ref 13.0–17.0)
MCH: 30.7 pg (ref 26.0–34.0)
MCHC: 33.5 g/dL (ref 30.0–36.0)
MCV: 91.7 fL (ref 78.0–100.0)
PLATELETS: 304 10*3/uL (ref 150–400)
RBC: 4.36 MIL/uL (ref 4.22–5.81)
RDW: 12 % (ref 11.5–15.5)
WBC: 5.8 10*3/uL (ref 4.0–10.5)

## 2017-11-21 LAB — TROPONIN I: Troponin I: <0.03 ng/mL (ref <0.03)

## 2017-11-21 LAB — PROTIME-INR
INR: 1.07
PROTHROMBIN TIME: 13.8 s (ref 11.4–15.2)

## 2017-11-21 LAB — HEMOGLOBIN A1C
Hgb A1c MFr Bld: 5.3 % (ref 4.8–5.6)
MEAN PLASMA GLUCOSE: 105.41 mg/dL

## 2017-11-21 MED ORDER — METOPROLOL TARTRATE 12.5 MG HALF TABLET
12.5000 mg | ORAL_TABLET | Freq: Two times a day (BID) | ORAL | Status: DC
  Administered 2017-11-21 – 2017-11-22 (×3): 12.5 mg via ORAL
  Filled 2017-11-21 (×3): qty 1

## 2017-11-21 MED ORDER — ASPIRIN 81 MG PO CHEW
324.0000 mg | CHEWABLE_TABLET | Freq: Every day | ORAL | Status: DC
  Administered 2017-11-21 – 2017-11-22 (×2): 324 mg via ORAL
  Filled 2017-11-21 (×2): qty 4

## 2017-11-21 MED ORDER — ADULT MULTIVITAMIN W/MINERALS CH
1.0000 | ORAL_TABLET | Freq: Every day | ORAL | Status: DC
  Administered 2017-11-21 – 2017-11-22 (×2): 1 via ORAL
  Filled 2017-11-21 (×2): qty 1

## 2017-11-21 MED ORDER — VITAMIN B-1 100 MG PO TABS
100.0000 mg | ORAL_TABLET | Freq: Every day | ORAL | Status: DC
  Administered 2017-11-21 – 2017-11-22 (×2): 100 mg via ORAL
  Filled 2017-11-21 (×2): qty 1

## 2017-11-21 MED ORDER — ACETAMINOPHEN 650 MG RE SUPP: 650.0000 mg | Freq: Four times a day (QID) | RECTAL | Status: DC | PRN

## 2017-11-21 MED ORDER — DIPHENHYDRAMINE HCL 25 MG PO CAPS
25.0000 mg | ORAL_CAPSULE | Freq: Once | ORAL | Status: AC
  Administered 2017-11-21: 25 mg via ORAL
  Filled 2017-11-21: qty 1

## 2017-11-21 MED ORDER — VITAMIN D 1000 UNITS PO TABS
5000.0000 [IU] | ORAL_TABLET | Freq: Every day | ORAL | Status: DC
  Administered 2017-11-21 – 2017-11-22 (×2): 5000 [IU] via ORAL
  Filled 2017-11-21 (×2): qty 5

## 2017-11-21 MED ORDER — IBUPROFEN 200 MG PO TABS
200.0000 mg | ORAL_TABLET | Freq: Three times a day (TID) | ORAL | Status: DC
  Administered 2017-11-21 – 2017-11-22 (×3): 200 mg via ORAL
  Filled 2017-11-21 (×3): qty 1

## 2017-11-21 MED ORDER — THIAMINE HCL 100 MG/ML IJ SOLN: 100.0000 mg | Freq: Every day | INTRAMUSCULAR | Status: DC

## 2017-11-21 MED ORDER — ACETAMINOPHEN 325 MG PO TABS: 650.0000 mg | ORAL_TABLET | Freq: Four times a day (QID) | ORAL | Status: DC | PRN

## 2017-11-21 MED ORDER — NITROGLYCERIN 2 % TD OINT: 0.5000 [in_us] | TOPICAL_OINTMENT | Freq: Once | TRANSDERMAL | Status: DC

## 2017-11-21 MED ORDER — LORAZEPAM 1 MG PO TABS: 0.0000 mg | ORAL_TABLET | Freq: Four times a day (QID) | ORAL | Status: DC

## 2017-11-21 MED ORDER — ALUM & MAG HYDROXIDE-SIMETH 200-200-20 MG/5ML PO SUSP: 30.0000 mL | Freq: Four times a day (QID) | ORAL | Status: DC | PRN

## 2017-11-21 MED ORDER — HYDRALAZINE HCL 20 MG/ML IJ SOLN: 5.0000 mg | INTRAMUSCULAR | Status: DC | PRN

## 2017-11-21 MED ORDER — ROSUVASTATIN CALCIUM 20 MG PO TABS
20.0000 mg | ORAL_TABLET | Freq: Every day | ORAL | Status: DC
  Administered 2017-11-22: 20 mg via ORAL
  Filled 2017-11-21: qty 1

## 2017-11-21 MED ORDER — LORAZEPAM 2 MG/ML IJ SOLN: 0.0000 mg | Freq: Two times a day (BID) | INTRAMUSCULAR | Status: DC

## 2017-11-21 MED ORDER — ASPIRIN 81 MG PO CHEW
324.0000 mg | CHEWABLE_TABLET | Freq: Once | ORAL | Status: AC
  Administered 2017-11-21: 324 mg via ORAL
  Filled 2017-11-21: qty 4

## 2017-11-21 MED ORDER — NITROGLYCERIN 0.4 MG SL SUBL
0.4000 mg | SUBLINGUAL_TABLET | SUBLINGUAL | Status: DC | PRN
Start: 2017-11-21 — End: 2017-11-22
  Administered 2017-11-21: 0.4 mg via SUBLINGUAL
  Filled 2017-11-21: qty 1

## 2017-11-21 MED ORDER — ISOSORBIDE MONONITRATE ER 30 MG PO TB24
30.0000 mg | ORAL_TABLET | Freq: Every day | ORAL | Status: DC
  Administered 2017-11-21 – 2017-11-22 (×2): 30 mg via ORAL
  Filled 2017-11-21 (×2): qty 1

## 2017-11-21 MED ORDER — LORAZEPAM 1 MG PO TABS: 0.0000 mg | ORAL_TABLET | Freq: Two times a day (BID) | ORAL | Status: DC

## 2017-11-21 MED ORDER — SENNOSIDES-DOCUSATE SODIUM 8.6-50 MG PO TABS: 1.0000 | ORAL_TABLET | Freq: Every evening | ORAL | Status: DC | PRN

## 2017-11-21 MED ORDER — LORAZEPAM 2 MG/ML IJ SOLN: 0.0000 mg | Freq: Four times a day (QID) | INTRAMUSCULAR | Status: DC

## 2017-11-21 MED ORDER — MORPHINE SULFATE (PF) 4 MG/ML IV SOLN: 2.0000 mg | INTRAVENOUS | Status: DC | PRN

## 2017-11-21 MED ORDER — ZOLPIDEM TARTRATE 5 MG PO TABS: 5.0000 mg | ORAL_TABLET | Freq: Every evening | ORAL | Status: DC | PRN

## 2017-11-21 MED ORDER — ONDANSETRON HCL 4 MG/2ML IJ SOLN: 4.0000 mg | Freq: Four times a day (QID) | INTRAMUSCULAR | Status: DC | PRN

## 2017-11-21 MED ORDER — PANTOPRAZOLE SODIUM 40 MG PO TBEC
40.0000 mg | DELAYED_RELEASE_TABLET | Freq: Every day | ORAL | Status: DC
  Administered 2017-11-21 – 2017-11-22 (×2): 40 mg via ORAL
  Filled 2017-11-21 (×2): qty 1

## 2017-11-21 MED ORDER — ONDANSETRON HCL 4 MG PO TABS: 4.0000 mg | ORAL_TABLET | Freq: Four times a day (QID) | ORAL | Status: DC | PRN

## 2017-11-21 NOTE — ED Notes (Signed)
MD paged regarding diet. Verbal orders to start on heart healthy diet

## 2017-11-21 NOTE — ED Provider Notes (Signed)
[facility] EMERGENCY DEPARTMENT Provider Note   CSN: [id number] Arrival date & time: 11/20/17  2253     History   Chief Complaint Chief Complaint  Patient presents with  . Chest Pain    HPI Savannah Erbe is a 57 y.o. male.  The history is provided by the patient, a relative and medical records.  Chest Pain   Associated symptoms include diaphoresis and shortness of breath.     57 y.o. M with hx of HTN, HLP, CAD s/p CABG x3 in 2017, presenting to the ED for chset pain.  Patient reports over the past few months he has been having some exertion chest pain while working.  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  He had an OP stress test done that was low risk and was started on Imdur.  States over the past 24 hours pain has gotten worse.  He was very active yesterday per family-- working in the yard.  States pain in mid-sternal to left sided, severity is coming in waves.  States he also feels SOB and has been hot/clammy today.  Apparently was on his tractor and had an unwitnessed syncopal event as he woke up on the ground.  States even after several hours of rest he still does not feel back to baseline.  He is a former smoker.  Follows with cardiology, Dr. Royann Shivers.  Did not take his ASA yesterday.  Past Medical History:  Diagnosis Date  . Coronary artery disease   . High cholesterol   . Hypertension     Patient Active Problem List   Diagnosis Date Noted  . 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 NSTEMI (non-ST elevated myocardial infarction) (HCC)   . Chest pain with moderate risk of acute coronary syndrome 05/27/2016  . High cholesterol   . Essential hypertension   . Calculus of gallbladder with acute cholecystitis, without mention of obstruction 04/19/2013    Past Surgical History:  Procedure Laterality Date  . ANKLE SURGERY    . CARDIAC CATHETERIZATION N/A 05/28/2016   Procedure: Left Heart Cath and Coronary Angiography;  Surgeon:  Kathleene Hazel, MD;  Location: [facility] INVASIVE CV LAB;  Service: Cardiovascular;  Laterality: N/A;  . CHOLECYSTECTOMY N/A 04/13/2013   Procedure: LAPAROSCOPIC CHOLECYSTECTOMY , attempted IOC.;  Surgeon: Ernestene Mention, MD;  Location: [facility] ORS;  Service: General;  Laterality: N/A;  . CORONARY ARTERY BYPASS GRAFT    . CORONARY ARTERY BYPASS GRAFT N/A 05/29/2016   Procedure: CORONARY ARTERY BYPASS GRAFTING (CABG) x 3 with bilateral internal mammary arteries and left radial artery grafting LIMA-LAD FREE RIMA-RCA LEFT RADIAL-OM;  Surgeon: Alleen Borne, MD;  Location: [facility] OR;  Service: Open Heart Surgery;  Laterality: N/A;  . RADIAL ARTERY HARVEST Left 05/29/2016   Procedure: RADIAL ARTERY HARVEST;  Surgeon: Alleen Borne, MD;  Location: [facility] OR;  Service: Open Heart Surgery;  Laterality: Left;  . TEE WITHOUT CARDIOVERSION N/A 05/29/2016   Procedure: TRANSESOPHAGEAL ECHOCARDIOGRAM (TEE);  Surgeon: Alleen Borne, MD;  Location: [facility] OR;  Service: Open Heart Surgery;  Laterality: N/A;        Home Medications    Prior to Admission medications   Medication Sig Start Date End Date Taking? Authorizing Provider  aspirin EC 81 MG tablet Take 1 tablet (81 mg total) by mouth daily. 07/20/16   Croitoru, Mihai, MD  Cholecalciferol ([employer] VIT D 5000 HIGH-POTENCY) 5000 units capsule Take 5,000 Units by mouth daily.  [provider]  isosorbide mononitrate (IMDUR) 30 MG 24 hr tablet Take 1 tablet (30 mg total) by mouth daily. 11/18/17   Abelino Derrick, PA-C  metoprolol tartrate (LOPRESSOR) 25 MG tablet Take 0.5 tablets (12.5 mg total) by mouth 2 (two) times daily. 06/04/16   Barrett, Erin R, PA-C  Multiple Vitamin (MULTIVITAMIN WITH MINERALS) TABS tablet Take 1 tablet by mouth daily.    [provider]  nitroGLYCERIN (NITROSTAT) 0.4 MG SL tablet Place 1 tablet (0.4 mg total) under the tongue every 5 (five) minutes as needed for chest pain. 11/01/17 01/30/18  Abelino Derrick, PA-C  pantoprazole  (PROTONIX) 40 MG tablet Take 1 tablet (40 mg total) by mouth daily. 06/04/16   Barrett, Erin R, PA-C  rosuvastatin (CRESTOR) 20 MG tablet Take 20 mg by mouth daily.    [provider]    Family History Family History  Problem Relation Age of Onset  . Pulmonary disease Maternal Aunt   . CAD Neg Hx     Social History Social History   Tobacco Use  . Smoking status: Former Smoker    Packs/day: 0.50  . Smokeless tobacco: Never Used  Substance Use Topics  . Alcohol use: Yes    Alcohol/week: 3.6 oz    Types: 6 Cans of beer per week  . Drug use: No     Allergies   Lipitor [atorvastatin]; Penicillins; and Zocor [simvastatin]   Review of Systems Review of Systems  Constitutional: Positive for diaphoresis.  Respiratory: Positive for shortness of breath.   Cardiovascular: Positive for chest pain.  Neurological: Positive for syncope.  All other systems reviewed and are negative.    Physical Exam Updated Vital Signs BP (!) 147/87 (BP Location: Right Arm)   Pulse 82   Temp 98.3 F (36.8 C) (Oral)   Resp 16   Ht 5\' 8"  (1.727 m)   Wt 96.2 kg (212 lb)   SpO2 95%   BMI 32.23 kg/m   Physical Exam  Constitutional: He is oriented to person, place, and time. He appears well-developed and well-nourished.  Appears uncomfortably, clammy  HENT:  Head: Normocephalic and atraumatic.  Mouth/Throat: Oropharynx is clear and moist.  Eyes: Pupils are equal, round, and reactive to light. Conjunctivae and EOM are normal.  Neck: Normal range of motion.  Cardiovascular: Normal rate, regular rhythm and normal heart sounds.  Pulmonary/Chest: Effort normal and breath sounds normal. He has no decreased breath sounds. He has no wheezes.  Chest wall tenderness, no deformities or bruising noted  Abdominal: Soft. Bowel sounds are normal.  Musculoskeletal: Normal range of motion.  Neurological: He is alert and oriented to person, place, and time.  Skin: Skin is warm and dry.    Psychiatric: He has a normal mood and affect.  Nursing note and vitals reviewed.    ED Treatments / Results  Labs (all labs ordered are listed, but only abnormal results are displayed) Labs Reviewed  BASIC METABOLIC PANEL - Abnormal; Notable for the following components:      Result Value   Glucose, Bld 121 (*)    BUN 5 (*)    All other components within normal limits  ETHANOL - Abnormal; Notable for the following components:   Alcohol, Ethyl (B) 81 (*)    All other components within normal limits  CBC  PROTIME-INR  CBC  RAPID URINE DRUG SCREEN, HOSP PERFORMED  HEMOGLOBIN A1C  LIPID PANEL  TROPONIN I  TROPONIN I  TROPONIN I  BASIC METABOLIC PANEL  HIV ANTIBODY (ROUTINE TESTING)  I-STAT TROPONIN, ED    EKG EKG Interpretation  Date/Time:  Saturday November 20 2017 23:00:37 EDT Ventricular Rate:  84 PR Interval:  164 QRS Duration: 76 QT Interval:  376 QTC Calculation: 444 R Axis:   -6 Text Interpretation:  Normal sinus rhythm Possible Left atrial enlargement Nonspecific ST and T wave abnormality Abnormal ECG When compared with ECG of 06/15/2016, Nonspecific T wave abnormality has improved Confirmed by Dione Booze ([id number]) on 11/21/2017 12:15:16 AM   Radiology Dg Chest 2 View  Result Date: 11/20/2017 CLINICAL DATA:  58 year old male status post bypass surgery 6 months ago with intermittent chest pain since that time. Mid left-sided chest pain and shortness breath on exertion. EXAM: CHEST - 2 VIEW COMPARISON:  Chest x-ray 07/01/2016. FINDINGS: Lung volumes are normal. No consolidative airspace disease. No pleural effusions. No pneumothorax. No pulmonary nodule or mass noted. Pulmonary vasculature and the cardiomediastinal silhouette are within normal limits. Status post median sternotomy for CABG. IMPRESSION: 1.  No radiographic evidence of acute cardiopulmonary disease. Electronically Signed   By: Trudie Reed M.D.   On: 11/20/2017 23:28    Procedures Procedures  (including critical care time)  Medications Ordered in ED Medications  nitroGLYCERIN (NITROSTAT) SL tablet 0.4 mg (0.4 mg Sublingual Given 11/21/17 0351)  LORazepam (ATIVAN) injection 0-4 mg (0 mg Intravenous Not Given 11/21/17 0354)    Or  LORazepam (ATIVAN) tablet 0-4 mg ( Oral See Alternative 11/21/17 0354)  LORazepam (ATIVAN) injection 0-4 mg (has no administration in time range)    Or  LORazepam (ATIVAN) tablet 0-4 mg (has no administration in time range)  thiamine (VITAMIN B-1) tablet 100 mg (has no administration in time range)    Or  thiamine (B-1) injection 100 mg (has no administration in time range)  aspirin chewable tablet 324 mg (has no administration in time range)  cholecalciferol (VITAMIN D) tablet 5,000 Units (has no administration in time range)  isosorbide mononitrate (IMDUR) 24 hr tablet 30 mg (has no administration in time range)  metoprolol tartrate (LOPRESSOR) tablet 12.5 mg (has no administration in time range)  multivitamin with minerals tablet 1 tablet (has no administration in time range)  pantoprazole (PROTONIX) EC tablet 40 mg (has no administration in time range)  rosuvastatin (CRESTOR) tablet 20 mg (has no administration in time range)  morphine 4 MG/ML injection 2 mg (has no administration in time range)  acetaminophen (TYLENOL) tablet 650 mg (has no administration in time range)    Or  acetaminophen (TYLENOL) suppository 650 mg (has no administration in time range)  senna-docusate (Senokot-S) tablet 1 tablet (has no administration in time range)  ondansetron (ZOFRAN) tablet 4 mg (has no administration in time range)    Or  ondansetron (ZOFRAN) injection 4 mg (has no administration in time range)  hydrALAZINE (APRESOLINE) injection 5 mg (has no administration in time range)  zolpidem (AMBIEN) tablet 5 mg (has no administration in time range)  alum & mag hydroxide-simeth (MAALOX/MYLANTA) 200-200-20 MG/5ML suspension 30 mL (has no administration in time range)    ibuprofen (ADVIL,MOTRIN) tablet 200 mg (has no administration in time range)  aspirin chewable tablet 324 mg (324 mg Oral Given 11/21/17 0306)     Initial Impression / Assessment and Plan / ED Course  I have reviewed the triage vital signs and the nursing notes.  Pertinent labs & imaging results that were available during my care of the patient were reviewed by me and considered in my medical decision making (see  chart for details).  57 y.o. M here after episode of chest pain.  Has been having exertional chest pain for the past month, mostly when working.  Had myoview on Friday (2 days ago) for same-- low risk and started on Imdur.  Per chart review, for ongoing issues would plan for cardiac cath.  Worsening pain today while working in the yard all day.  Apparently had syncopal event, woke up on the ground next to his tractor.  Denies injuries from this.  EKG here unchanged.  Labs all reassuring.  CXR clear.  On exam, patient does have reproducible chest tenderness, however given his syncopal event today with ongoing pain, I do have concern for cardiac etiology.  Given ASA, NTG.  3:48 AM Spoke with cards fellow-- daughter had called earlier today and told about this.  Apparently, patient did drink 8-10 beers today.  Unsure if this is a daily occurrence.  Agrees with plan for admission with cardiac cath, team to see in the morning.  Recommended medical admission due to potential for alcohol withdrawal.  I will place on CIWA protocol.  Discussed with Dr. Clyde LundborgNiu-- he has evaluated in the ED and will admit.  Wants to hold off on heparin for now as he has ordered MRI of the brain (has ongoing arm weakness since CABG in 2017).  Final Clinical Impressions(s) / ED Diagnoses   Final diagnoses:  Chest pain in adult    ED Discharge Orders    None       Garlon HatchetSanders, Lisa M, PA-C 11/21/17 0540    Gilda CreasePollina, Christopher J, MD 11/21/17 [id number]

## 2017-11-21 NOTE — ED Notes (Signed)
Patient transported to MRI 

## 2017-11-21 NOTE — H&P (Signed)
History and Physical    Dennis Winters ZOX:[id number] DOB: [birth year] DOA: 11/20/2017  Referring MD/NP/PA:   PCP: Lucia Estelle, NP   Patient coming from:  The patient is coming from home.  At baseline, pt is independent for most of ADL.   Chief Complaint: chest pain and syncope  HPI: Dennis Winters is a 57 y.o. male with medical history significant of hypertension, hyperlipidemia, GERD, CAD, CABG, alcohol use, former smoker, who presents with chest pain and syncope.  Patient states that he has been having intermittent chest pain for more than 1 month.  His chest pain is located in substernal area, intermittent, pressure-like, exertional, nonradiating.  He states that his chest pain has worsened today, which is located in the substernal area, 8 out of 10 severity, pressure-like, nonradiating.  Patient also has significant chest wall tenderness on palpation.  No cough, fever or chills.  He had mild shortness breath earlier, which has resolved. No tenderness in the calf areas. Pt states that he passed out when he was on his tractor yesteray. States that he woke up on the ground. He states that he has been having left arm weakness and funny feeling ever since he had CABG surgery 2017, which has not changed.  No new nilateral weakness in other extremities.  No facial droop, slurred speech. Patient has nausea, no vomiting, diarrhea or abdominal pain, no symptoms of UTI. He had Myoview stress test done on 11/12/17. It showed ischemia, but was low risk , EF 60%, and was started on Imdur. Follows with cardiology, Dr. Royann Shivers. He states that he drinks beer, 3-4 time per week and 4-5 beers each time.  ED Course: pt was found to have negative troponin, WBC 5.8, electrolytes renal function okay, temperature normal, no tachycardia, no tachypnea, oxygen saturation 96% on room air, negative chest x-ray.  Patient is paced on telemetry bed of observation.  Cardiology fellow was consulted by EDP, they will see patient  in the morning.  Review of Systems:   General: no fevers, chills, no body weight gain, has fatigue HEENT: no blurry vision, hearing changes or sore throat Respiratory: had dyspnea, no coughing, wheezing CV: has chest pain, no palpitations GI: has nausea, no vomiting, abdominal pain, diarrhea, constipation GU: no dysuria, burning on urination, increased urinary frequency, hematuria  Ext: no leg edema Neuro: has left arm weakness, no vision change or hearing loss. Had syncope. Skin: no rash, no skin tear. MSK: No muscle spasm, no deformity, no limitation of range of movement in spin. Has chest wall tenderness. Heme: No easy bruising.  Travel history: No recent long distant travel.  Allergy:  Allergies  Allergen Reactions  . Lipitor [Atorvastatin] Other (See Comments)    Myalgia   . Penicillins Rash    Has patient had a PCN reaction causing immediate rash, facial/tongue/throat swelling, SOB or lightheadedness with hypotension: Yes Has patient had a PCN reaction causing severe rash involving mucus membranes or skin necrosis: Yes Has patient had a PCN reaction that required hospitalization No Has patient had a PCN reaction occurring within the last 10 years: Non  If all of the above answers are "NO", then may proceed with Cephalosporin use.   . Zocor [Simvastatin] Other (See Comments)    Myalgias    Past Medical History:  Diagnosis Date  . Coronary artery disease   . High cholesterol   . Hypertension     Past Surgical History:  Procedure Laterality Date  . ANKLE SURGERY    . CARDIAC CATHETERIZATION  N/A 05/28/2016   Procedure: Left Heart Cath and Coronary Angiography;  Surgeon: Kathleene Hazel, MD;  Location: [facility] INVASIVE CV LAB;  Service: Cardiovascular;  Laterality: N/A;  . CHOLECYSTECTOMY N/A 04/13/2013   Procedure: LAPAROSCOPIC CHOLECYSTECTOMY , attempted IOC.;  Surgeon: Ernestene Mention, MD;  Location: [facility] ORS;  Service: General;  Laterality: N/A;  . CORONARY  ARTERY BYPASS GRAFT    . CORONARY ARTERY BYPASS GRAFT N/A 05/29/2016   Procedure: CORONARY ARTERY BYPASS GRAFTING (CABG) x 3 with bilateral internal mammary arteries and left radial artery grafting LIMA-LAD FREE RIMA-RCA LEFT RADIAL-OM;  Surgeon: Alleen Borne, MD;  Location: [facility] OR;  Service: Open Heart Surgery;  Laterality: N/A;  . RADIAL ARTERY HARVEST Left 05/29/2016   Procedure: RADIAL ARTERY HARVEST;  Surgeon: Alleen Borne, MD;  Location: [facility] OR;  Service: Open Heart Surgery;  Laterality: Left;  . TEE WITHOUT CARDIOVERSION N/A 05/29/2016   Procedure: TRANSESOPHAGEAL ECHOCARDIOGRAM (TEE);  Surgeon: Alleen Borne, MD;  Location: [facility] OR;  Service: Open Heart Surgery;  Laterality: N/A;    Social History:  reports that he has quit smoking. He smoked 0.50 packs per day. He has never used smokeless tobacco. He reports that he drinks about 3.6 oz of alcohol per week. He reports that he does not use drugs.  Family History:  Family History  Problem Relation Age of Onset  . Pulmonary disease Maternal Aunt   . CAD Neg Hx      Prior to Admission medications   Medication Sig Start Date End Date Taking? Authorizing Provider  aspirin EC 81 MG tablet Take 1 tablet (81 mg total) by mouth daily. 07/20/16   Croitoru, Mihai, MD  Cholecalciferol ([employer]S VIT D 5000 HIGH-POTENCY) 5000 units capsule Take 5,000 Units by mouth daily.    [provider]  isosorbide mononitrate (IMDUR) 30 MG 24 hr tablet Take 1 tablet (30 mg total) by mouth daily. 11/18/17   Abelino Derrick, PA-C  metoprolol tartrate (LOPRESSOR) 25 MG tablet Take 0.5 tablets (12.5 mg total) by mouth 2 (two) times daily. 06/04/16   Barrett, Erin R, PA-C  Multiple Vitamin (MULTIVITAMIN WITH MINERALS) TABS tablet Take 1 tablet by mouth daily.    [provider]  nitroGLYCERIN (NITROSTAT) 0.4 MG SL tablet Place 1 tablet (0.4 mg total) under the tongue every 5 (five) minutes as needed for chest pain. 11/01/17 01/30/18  Abelino Derrick, PA-C    pantoprazole (PROTONIX) 40 MG tablet Take 1 tablet (40 mg total) by mouth daily. 06/04/16   Barrett, Erin R, PA-C  rosuvastatin (CRESTOR) 20 MG tablet Take 20 mg by mouth daily.    [provider]    Physical Exam: Vitals:   11/21/17 0330 11/21/17 0353 11/21/17 0400 11/21/17 0445  BP: 136/86 130/82 127/75 128/81  Pulse: 74 69 85 74  Resp: 12  18 13   Temp:      TempSrc:      SpO2: 96%  96% 95%  Weight:      Height:       General: Not in acute distress HEENT:       Eyes: PERRL, EOMI, no scleral icterus.       ENT: No discharge from the ears and nose, no pharynx injection, no tonsillar enlargement.        Neck: No JVD, no bruit, no mass felt. Heme: No neck lymph node enlargement. Cardiac: S1/S2, RRR, No murmurs, No gallops or rubs. Respiratory: No rales, wheezing, rhonchi or rubs. Chest wall: has  significant chest wall tenderness on palpation GI: Soft, nondistended, nontender, no rebound pain, no organomegaly, BS present. GU: No hematuria Ext: No pitting leg edema bilaterally. 2+DP/PT pulse bilaterally. Musculoskeletal: No joint deformities, No joint redness or warmth, no limitation of ROM in spin. Skin: No rashes.  Neuro: Alert, oriented X3, cranial nerves II-XII grossly intact, moves all extremities normally. Muscle strength 4/5 in left arm and 5/5 in other extremities, sensation to light touch intact. Brachial reflex 2+ bilaterally. Negative Babinski's sign. Psych: Patient is not psychotic, no suicidal or hemocidal ideation.  Labs on Admission: I have personally reviewed following labs and imaging studies  CBC: Recent Labs  Lab 11/20/17 2306  WBC 5.8  HGB 14.2  HCT 42.1  MCV 91.3  PLT 317   Basic Metabolic Panel: Recent Labs  Lab 11/20/17 2306  NA 138  K 3.6  CL 105  CO2 22  GLUCOSE 121*  BUN 5*  CREATININE 0.92  CALCIUM 8.9   GFR: Estimated Creatinine Clearance: 102 mL/min (by C-G formula based on SCr of 0.92 mg/dL). Liver Function Tests: No  results for input(s): AST, ALT, ALKPHOS, BILITOT, PROT, ALBUMIN in the last 168 hours. No results for input(s): LIPASE, AMYLASE in the last 168 hours. No results for input(s): AMMONIA in the last 168 hours. Coagulation Profile: No results for input(s): INR, PROTIME in the last 168 hours. Cardiac Enzymes: No results for input(s): CKTOTAL, CKMB, CKMBINDEX, TROPONINI in the last 168 hours. BNP (last 3 results) No results for input(s): PROBNP in the last 8760 hours. HbA1C: No results for input(s): HGBA1C in the last 72 hours. CBG: No results for input(s): GLUCAP in the last 168 hours. Lipid Profile: No results for input(s): CHOL, HDL, LDLCALC, TRIG, CHOLHDL, LDLDIRECT in the last 72 hours. Thyroid Function Tests: No results for input(s): TSH, T4TOTAL, FREET4, T3FREE, THYROIDAB in the last 72 hours. Anemia Panel: No results for input(s): VITAMINB12, FOLATE, FERRITIN, TIBC, IRON, RETICCTPCT in the last 72 hours. Urine analysis:    Component Value Date/Time   COLORURINE STRAW (A) 05/28/2016 2212   APPEARANCEUR CLEAR 05/28/2016 2212   LABSPEC 1.008 05/28/2016 2212   PHURINE 6.0 05/28/2016 2212   GLUCOSEU NEGATIVE 05/28/2016 2212   HGBUR NEGATIVE 05/28/2016 2212   BILIRUBINUR NEGATIVE 05/28/2016 2212   KETONESUR NEGATIVE 05/28/2016 2212   PROTEINUR NEGATIVE 05/28/2016 2212   NITRITE NEGATIVE 05/28/2016 2212   LEUKOCYTESUR NEGATIVE 05/28/2016 2212   Sepsis Labs: @LABRCNTIP (procalcitonin:4,lacticidven:4) )No results found for this or any previous visit (from the past 240 hour(s)).   Radiological Exams on Admission: Dg Chest 2 View  Result Date: 11/20/2017 CLINICAL DATA:  57 year old male status post bypass surgery 6 months ago with intermittent chest pain since that time. Mid left-sided chest pain and shortness breath on exertion. EXAM: CHEST - 2 VIEW COMPARISON:  Chest x-ray 07/01/2016. FINDINGS: Lung volumes are normal. No consolidative airspace disease. No pleural effusions. No  pneumothorax. No pulmonary nodule or mass noted. Pulmonary vasculature and the cardiomediastinal silhouette are within normal limits. Status post median sternotomy for CABG. IMPRESSION: 1.  No radiographic evidence of acute cardiopulmonary disease. Electronically Signed   By: Trudie Reed M.D.   On: 11/20/2017 23:28     EKG: Independently reviewed.  Sinus rhythm, QTC 444, T wave inversion in V1-V2.  Assessment/Plan Principal Problem:   Chest pain Active Problems:   High cholesterol   Essential hypertension   S/P CABG x 3   Alcohol use   Syncope   Chest pain and CAD: s/p of  CABG. Patient has been has both typical and atypical nature.  He has significant chest wall tenderness currently. Chest x-ray is negative. He is at risk of unstable angina given persistent pain.  Cardiology fellow was consulted by EDP, they will see patient in the morning, possibly need cath.  troponin negative, EKG showed mild T wave inversion in V1-V2, which is slightly worse than previous EKG.  - will place on Tele bed for obs - cycle CE q6 x3 and repeat EKG in the am  - prn Nitroglycerin, Morphine, and aspirin, crestor and imdur  - Ibuprofen 200 mg tid - Risk factor stratification: will check FLP, UDS and A1C  - 2d echo - will need to r/o intracranial issues due to syncope before considering IV heparin  HLD: -crestor  HTN:  -Continue home medications: Metoprolol -IV hydralazine prn  Tobacco abuse: -Did counseling about the importance of quitting drinking -CIWA protocol  Syncope: Etiology is not clear, may be related to alcohol abuse.  Since patient has left arm weakness and funny feeling, will need to rule out stroke. -MRI of the brain -f/u 2d echo -Frequent neuro check  GERD: -Protonix and Mylanta prn   DVT ppx: SQ Lovenox Code Status: Full code Family Communication:  Yes, patient's daughter at bed side Disposition Plan:  Anticipate discharge back to previous home environment Consults  called:  Card fellow Admission status: Obs / tele    Date of Service 11/21/2017    Lorretta HarpXilin Dewarren Ledbetter [facility] Pager [phone]  If 7PM-7AM, please contact night-coverage [email or web address] Password [email or web address] 11/21/2017, 5:00 AM

## 2017-11-21 NOTE — ED Notes (Signed)
Reports increase in pain.  Notably uncomfortable.  Placed in room A 10.  States I don't want pain medicine I can't take that stuff.  To leave in this room on a stretcher for comfort until a room comes available.

## 2017-11-22 ENCOUNTER — Observation Stay (HOSPITAL_BASED_OUTPATIENT_CLINIC_OR_DEPARTMENT_OTHER): Payer: 59

## 2017-11-22 ENCOUNTER — Observation Stay (HOSPITAL_COMMUNITY): Payer: 59

## 2017-11-22 DIAGNOSIS — I25708 Atherosclerosis of coronary artery bypass graft(s), unspecified, with other forms of angina pectoris: Secondary | ICD-10-CM

## 2017-11-22 DIAGNOSIS — Z951 Presence of aortocoronary bypass graft: Secondary | ICD-10-CM

## 2017-11-22 DIAGNOSIS — R55 Syncope and collapse: Secondary | ICD-10-CM

## 2017-11-22 DIAGNOSIS — F1490 Cocaine use, unspecified, uncomplicated: Secondary | ICD-10-CM

## 2017-11-22 DIAGNOSIS — F1410 Cocaine abuse, uncomplicated: Secondary | ICD-10-CM

## 2017-11-22 DIAGNOSIS — R079 Chest pain, unspecified: Secondary | ICD-10-CM

## 2017-11-22 DIAGNOSIS — I10 Essential (primary) hypertension: Secondary | ICD-10-CM

## 2017-11-22 DIAGNOSIS — E7800 Pure hypercholesterolemia, unspecified: Secondary | ICD-10-CM

## 2017-11-22 DIAGNOSIS — Z789 Other specified health status: Secondary | ICD-10-CM

## 2017-11-22 LAB — ECHOCARDIOGRAM COMPLETE
HEIGHTINCHES: 68 in
Weight: 3323.2 oz

## 2017-11-22 LAB — HIV ANTIBODY (ROUTINE TESTING W REFLEX): HIV Screen 4th Generation wRfx: NONREACTIVE

## 2017-11-22 MED ORDER — CARVEDILOL 3.125 MG PO TABS: 3.1250 mg | ORAL_TABLET | Freq: Two times a day (BID) | ORAL | 0 refills | Status: DC

## 2017-11-22 NOTE — Progress Notes (Signed)
Progress Note  Patient Name: Dennis Winters Date of Encounter: 11/22/2017  Primary Cardiologist: No primary care provider on file.   Subjective   He has not had any chest pain, dyspnea or syncope since admission.  Lying fully flat in bed without discomfort.  No arrhythmia on monitor.  Admits using cocaine, a couple of days ago.  He cannot be more precise.  Inpatient Medications    Scheduled Meds: . aspirin  324 mg Oral Daily  . cholecalciferol  5,000 Units Oral Daily  . ibuprofen  200 mg Oral TID  . isosorbide mononitrate  30 mg Oral Daily  . LORazepam  0-4 mg Intravenous Q6H   Or  . LORazepam  0-4 mg Oral Q6H  . [START ON 11/23/2017] LORazepam  0-4 mg Intravenous Q12H   Or  . [START ON 11/23/2017] LORazepam  0-4 mg Oral Q12H  . metoprolol tartrate  12.5 mg Oral BID  . multivitamin with minerals  1 tablet Oral Daily  . pantoprazole  40 mg Oral Daily  . rosuvastatin  20 mg Oral Daily  . thiamine  100 mg Oral Daily   Or  . thiamine  100 mg Intravenous Daily   Continuous Infusions:  PRN Meds: acetaminophen **OR** acetaminophen, alum & mag hydroxide-simeth, hydrALAZINE, morphine injection, nitroGLYCERIN, ondansetron **OR** ondansetron (ZOFRAN) IV, senna-docusate, zolpidem   Vital Signs    Vitals:   11/21/17 1800 11/21/17 1951 11/21/17 2200 11/22/17 0521  BP:  (!) 136/93  128/82  Pulse:  64 68 (!) 58  Resp:    16  Temp:  97.9 F (36.6 C)  97.8 F (36.6 C)  TempSrc:  Oral  Oral  SpO2:  97%  100%  Weight: 210 lb 4.8 oz (95.4 kg)   207 lb 11.2 oz (94.2 kg)  Height: 5\' 8"  (1.727 m)      No intake or output data in the 24 hours ending 11/22/17 0907 Filed Weights   11/20/17 2302 11/21/17 1800 11/22/17 0521  Weight: 212 lb (96.2 kg) 210 lb 4.8 oz (95.4 kg) 207 lb 11.2 oz (94.2 kg)    Telemetry    Normal sinus rhythm- Personally Reviewed  ECG    Normal sinus rhythm with early R wave transition lead V2- Personally Reviewed  Physical Exam  Comfortable lying fully  flat in bed GEN: No acute distress.   Neck: No JVD Cardiac: RRR, no murmurs, rubs, or gallops.  Respiratory: Clear to auscultation bilaterally. GI: Soft, nontender, non-distended  MS: No edema; No deformity. Neuro:  Nonfocal  Psych: Normal affect   Labs    Chemistry Recent Labs  Lab 11/20/17 2306 11/21/17 0454  NA 138 139  K 3.6 3.6  CL 105 105  CO2 22 22  GLUCOSE 121* 97  BUN 5* 5*  CREATININE 0.92 0.91  CALCIUM 8.9 8.6*  GFRNONAA >60 >60  GFRAA >60 >60  ANIONGAP 11 12     Hematology Recent Labs  Lab 11/20/17 2306 11/21/17 0454  WBC 5.8 5.8  RBC 4.61 4.36  HGB 14.2 13.4  HCT 42.1 40.0  MCV 91.3 91.7  MCH 30.8 30.7  MCHC 33.7 33.5  RDW 11.9 12.0  PLT 317 304    Cardiac Enzymes Recent Labs  Lab 11/21/17 0454 11/21/17 1010 11/21/17 1654  TROPONINI <0.03 <0.03 <0.03    Recent Labs  Lab 11/20/17 2311  TROPIPOC 0.00     BNPNo results for input(s): BNP, PROBNP in the last 168 hours.   DDimer No results for input(s): DDIMER  in the last 168 hours.   Radiology    Dg Chest 2 View  Result Date: 11/20/2017 CLINICAL DATA:  57 year old male status post bypass surgery 6 months ago with intermittent chest pain since that time. Mid left-sided chest pain and shortness breath on exertion. EXAM: CHEST - 2 VIEW COMPARISON:  Chest x-ray 07/01/2016. FINDINGS: Lung volumes are normal. No consolidative airspace disease. No pleural effusions. No pneumothorax. No pulmonary nodule or mass noted. Pulmonary vasculature and the cardiomediastinal silhouette are within normal limits. Status post median sternotomy for CABG. IMPRESSION: 1.  No radiographic evidence of acute cardiopulmonary disease. Electronically Signed   By: Trudie Reedaniel  Entrikin M.D.   On: 11/20/2017 23:28   Mr Brain Wo Contrast  Result Date: 11/21/2017 CLINICAL DATA:  Syncope. EXAM: MRI HEAD WITHOUT CONTRAST TECHNIQUE: Multiplanar, multiecho pulse sequences of the brain and surrounding structures were obtained  without intravenous contrast. COMPARISON:  CT 10/30/2012 FINDINGS: Brain: Brain has normal appearance without evidence of malformation, atrophy, old or acute small or large vessel infarction, mass lesion, hemorrhage, hydrocephalus or extra-axial collection. Vascular: Major vessels at the base of the brain show flow. Venous sinuses appear patent. Skull and upper cervical spine: Normal. Sinuses/Orbits: Ordinary mucosal inflammatory changes throughout the paranasal sinuses. Orbits negative. Other: None significant. IMPRESSION: Normal appearance of the brain.  No cause of syncope identified. Mucosal inflammation of the sinuses. Electronically Signed   By: Paulina FusiMark  Shogry M.D.   On: 11/21/2017 07:02    Cardiac Studies  Nov 12, 2017 stress test  Nuclear stress EF: 60%.  The left ventricular ejection fraction is normal (55-65%).  Blood pressure demonstrated a hypertensive response to exercise.  Upsloping ST segment depression ST segment depression of 1 mm was noted during stress in the II, III and aVF leads.  Findings consistent with ischemia.  This is a low risk study.   Small area of mild inferior wall ischemia at the base Patient also had 1 mm up-sloping ST segment depression with exercise EF normal 60%   Patient Profile     57 y.o. male with coronary artery disease and previous bypass surgery, hypertension, hyperlipidemia, recent cocaine use, admitted following a syncopal event associated with working in the heat and consuming alcohol.  Recent cocaine use.  Low risk ECG and cardiac enzymes  Assessment & Plan    Syncopal event was preceded by nausea and vomiting, likely related to hypotension working outside in the heat after drinking alcohol and using cocaine. No arrhythmia has been detected. I reviewed his nuclear stress test.  There is indeed a very small and mild inferior ischemic defect. No evidence of acute coronary syndrome based on labs and ECG. Echo pending. If there is no  significant regional wall motion abnormality on the echocardiogram, I do not think he requires invasive coronary angiography.  I would focus on medical therapy for what seems to be a small area of inferior wall ischemia with possible exertional angina. I told him in no uncertain terms that any future cocaine use could be lethal. Switch metoprolol to carvedilol, which may have less deleterious effect if he decides to use cocaine again. Continue aspirin and statin.  For questions or updates, please contact [facility] Please consult [email or web address] for contact info under Cardiology/STEMI.      Signed, Thurmon FairMihai Mabelle Mungin, MD  11/22/2017, 9:07 AM

## 2017-11-22 NOTE — Progress Notes (Signed)
  Echocardiogram 2D Echocardiogram has been performed.  Maricsa Sammons G Petrice Beedy 11/22/2017, 2:35 PM

## 2017-11-22 NOTE — Discharge Summary (Signed)
Physician Discharge Summary  Dennis QuickMarshall Dehart VWU:[id number] DOB: 05/24/1961 DOA: 11/20/2017  PCP: Lucia EstelleZheng, Feng, NP  Admit date: 11/20/2017 Discharge date: 11/22/2017  Admitted From: home Discharge disposition: home   Recommendations for Outpatient Follow-Up:   1. Cocaine cessation   Discharge Diagnosis:   Principal Problem:   Chest pain Active Problems:   High cholesterol   Essential hypertension   S/P CABG x 3   Alcohol use   Syncope    Discharge Condition: Improved.  Diet recommendation: Low sodium, heart healthy.  Wound care: None.  Code status: Full.   History of Present Illness:   Dennis Winters is a 57 y.o. male with medical history significant of hypertension, hyperlipidemia, GERD, CAD, CABG, alcohol use, former smoker, who presents with chest pain and syncope.  Patient states that he has been having intermittent chest pain for more than 1 month.  His chest pain is located in substernal area, intermittent, pressure-like, exertional, nonradiating.  He states that his chest pain has worsened today, which is located in the substernal area, 8 out of 10 severity, pressure-like, nonradiating.  Patient also has significant chest wall tenderness on palpation.  No cough, fever or chills.  He had mild shortness breath earlier, which has resolved. No tenderness in the calf areas. Pt states that he passed out when he was on his tractor yesteray. States that he woke up on the ground. He states that he has been having left arm weakness and funny feeling ever since he had CABG surgery 2017, which has not changed.  No new nilateral weakness in other extremities.  No facial droop, slurred speech. Patient has nausea, no vomiting, diarrhea or abdominal pain, no symptoms of UTI.He had Myoview stress test done on 11/12/17. It showed ischemia, but was low risk , EF 60%, and was started on Imdur. Follows with cardiology, Dr. Royann Shiversroitoru. He states that he drinks beer, 3-4 time per week and  4-5 beers each time.     Hospital Course by Problem:   Syncope due to N/V from heat exhaustion -recent stress test -echo w/o wall motion abnormalities -cocaine cessation  Alcohol use -encouraged cessation  HTN -asa -change metoprolol to coreg -cardiology follow up  Cocaine abuse -encourage cessation      Medical Consultants:    cards  Discharge Exam:   Vitals:   11/22/17 0941 11/22/17 1509  BP: 123/84 119/80  Pulse: 65 (!) 58  Resp:    Temp:  98 F (36.7 C)  SpO2:  98%   Vitals:   11/21/17 2200 11/22/17 0521 11/22/17 0941 11/22/17 1509  BP:  128/82 123/84 119/80  Pulse: 68 (!) 58 65 (!) 58  Resp:  16    Temp:  97.8 F (36.6 C)  98 F (36.7 C)  TempSrc:  Oral  Oral  SpO2:  100%  98%  Weight:  94.2 kg (207 lb 11.2 oz)    Height:        General exam: Appears calm and comfortable.   The results of significant diagnostics from this hospitalization (including imaging, microbiology, ancillary and laboratory) are listed below for reference.     Procedures and Diagnostic Studies:   Dg Chest 2 View  Result Date: 11/20/2017 CLINICAL DATA:  57 year old male status post bypass surgery 6 months ago with intermittent chest pain since that time. Mid left-sided chest pain and shortness breath on exertion. EXAM: CHEST - 2 VIEW COMPARISON:  Chest x-ray 07/01/2016. FINDINGS: Lung volumes are normal. No consolidative airspace disease.  No pleural effusions. No pneumothorax. No pulmonary nodule or mass noted. Pulmonary vasculature and the cardiomediastinal silhouette are within normal limits. Status post median sternotomy for CABG. IMPRESSION: 1.  No radiographic evidence of acute cardiopulmonary disease. Electronically Signed   By: Trudie Reed M.D.   On: 11/20/2017 23:28   Mr Brain Wo Contrast  Result Date: 11/21/2017 CLINICAL DATA:  Syncope. EXAM: MRI HEAD WITHOUT CONTRAST TECHNIQUE: Multiplanar, multiecho pulse sequences of the brain and surrounding structures  were obtained without intravenous contrast. COMPARISON:  CT 10/30/2012 FINDINGS: Brain: Brain has normal appearance without evidence of malformation, atrophy, old or acute small or large vessel infarction, mass lesion, hemorrhage, hydrocephalus or extra-axial collection. Vascular: Major vessels at the base of the brain show flow. Venous sinuses appear patent. Skull and upper cervical spine: Normal. Sinuses/Orbits: Ordinary mucosal inflammatory changes throughout the paranasal sinuses. Orbits negative. Other: None significant. IMPRESSION: Normal appearance of the brain.  No cause of syncope identified. Mucosal inflammation of the sinuses. Electronically Signed   By: Paulina Fusi M.D.   On: 11/21/2017 07:02     Labs:   Basic Metabolic Panel: Recent Labs  Lab 11/20/17 2306 11/21/17 0454  NA 138 139  K 3.6 3.6  CL 105 105  CO2 22 22  GLUCOSE 121* 97  BUN 5* 5*  CREATININE 0.92 0.91  CALCIUM 8.9 8.6*   GFR Estimated Creatinine Clearance: 102.1 mL/min (by C-G formula based on SCr of 0.91 mg/dL). Liver Function Tests: No results for input(s): AST, ALT, ALKPHOS, BILITOT, PROT, ALBUMIN in the last 168 hours. No results for input(s): LIPASE, AMYLASE in the last 168 hours. No results for input(s): AMMONIA in the last 168 hours. Coagulation profile Recent Labs  Lab 11/21/17 0454  INR 1.07    CBC: Recent Labs  Lab 11/20/17 2306 11/21/17 0454  WBC 5.8 5.8  HGB 14.2 13.4  HCT 42.1 40.0  MCV 91.3 91.7  PLT 317 304   Cardiac Enzymes: Recent Labs  Lab 11/21/17 0454 11/21/17 1010 11/21/17 1654  TROPONINI <0.03 <0.03 <0.03   BNP: Invalid input(s): POCBNP CBG: No results for input(s): GLUCAP in the last 168 hours. D-Dimer No results for input(s): DDIMER in the last 72 hours. Hgb A1c Recent Labs    11/21/17 0454  HGBA1C 5.3   Lipid Profile Recent Labs    11/21/17 0454  CHOL 219*  HDL 39*  LDLCALC UNABLE TO CALCULATE IF TRIGLYCERIDE OVER 400 mg/dL  TRIG 098*  CHOLHDL  5.6   Thyroid function studies No results for input(s): TSH, T4TOTAL, T3FREE, THYROIDAB in the last 72 hours.  Invalid input(s): FREET3 Anemia work up No results for input(s): VITAMINB12, FOLATE, FERRITIN, TIBC, IRON, RETICCTPCT in the last 72 hours. Microbiology No results found for this or any previous visit (from the past 240 hour(s)).   Discharge Instructions:   Discharge Instructions    Diet - low sodium heart healthy   Complete by:  As directed    Discharge instructions   Complete by:  As directed    Alcohol and cocaine cessation   Increase activity slowly   Complete by:  As directed      Allergies as of 11/22/2017      Reactions   Lipitor [atorvastatin] Other (See Comments)   Myalgia   Penicillins Rash   Has patient had a PCN reaction causing immediate rash, facial/tongue/throat swelling, SOB or lightheadedness with hypotension: Yes Has patient had a PCN reaction causing severe rash involving mucus membranes or skin necrosis: Yes Has  patient had a PCN reaction that required hospitalization No Has patient had a PCN reaction occurring within the last 10 years: Non If all of the above answers are "NO", then may proceed with Cephalosporin use.   Zocor [simvastatin] Other (See Comments)   Myalgias      Medication List    STOP taking these medications   metoprolol tartrate 25 MG tablet Commonly known as:  LOPRESSOR     TAKE these medications   aspirin EC 81 MG tablet Take 1 tablet (81 mg total) by mouth daily.   carvedilol 3.125 MG tablet Commonly known as:  COREG Take 1 tablet (3.125 mg total) by mouth 2 (two) times daily.   [employer] VIT D 5000 HIGH-POTENCY 5000 units capsule Generic drug:  Cholecalciferol Take 5,000 Units by mouth daily.   isosorbide mononitrate 30 MG 24 hr tablet Commonly known as:  IMDUR Take 1 tablet (30 mg total) by mouth daily.   multivitamin with minerals Tabs tablet Take 1 tablet by mouth daily.   nitroGLYCERIN 0.4 MG SL  tablet Commonly known as:  NITROSTAT Place 1 tablet (0.4 mg total) under the tongue every 5 (five) minutes as needed for chest pain.   pantoprazole 40 MG tablet Commonly known as:  PROTONIX Take 1 tablet (40 mg total) by mouth daily.   rosuvastatin 20 MG tablet Commonly known as:  CRESTOR Take 20 mg by mouth daily.      Follow-up Information    Lucia Estelle, NP Follow up in 1 week(s).   Specialty:  Nurse Practitioner Contact information: [address] [place] [address] [phone]            Time coordinating discharge: 35 min  Signed:  Joseph Art  [facility] 11/22/2017, 3:23 PM

## 2017-11-22 NOTE — Consult Note (Signed)
Cardiology Consult    Patient ID: Dennis Winters MRN: [id number], DOB/AGE: 1960/08/20   Admit date: 11/20/2017 Date of Consult: 11/22/2017  Primary Physician: Lucia EstelleZheng, Feng, NP Primary Cardiologist: No primary care provider on file. Requesting Provider: Dr. Clyde LundborgNiu  Patient Profile    Dennis QuickMarshall Goodley is a 57 y.o. male with a history of HTN, HL, CAD s/p 3v CABG in 2015, who is being seen today for the evaluation of chest pain and syncope at the request of Dr. Clyde LundborgNiu.  Past Medical History   Past Medical History:  Diagnosis Date  . Coronary artery disease   . High cholesterol   . Hypertension     Past Surgical History:  Procedure Laterality Date  . ANKLE SURGERY    . CARDIAC CATHETERIZATION N/A 05/28/2016   Procedure: Left Heart Cath and Coronary Angiography;  Surgeon: Kathleene Hazelhristopher D McAlhany, MD;  [facility];  Service: Cardiovascular;  Laterality: N/A;  . CHOLECYSTECTOMY N/A 04/13/2013   Procedure: LAPAROSCOPIC CHOLECYSTECTOMY , attempted IOC.;  Surgeon: Ernestene MentionHaywood M Ingram, MD;  Location: [facility] ORS;  Service: General;  Laterality: N/A;  . CORONARY ARTERY BYPASS GRAFT    . CORONARY ARTERY BYPASS GRAFT N/A 05/29/2016   Procedure: CORONARY ARTERY BYPASS GRAFTING (CABG) x 3 with bilateral internal mammary arteries and left radial artery grafting LIMA-LAD FREE RIMA-RCA LEFT RADIAL-OM;  Surgeon: Alleen BorneBryan K Bartle, MD;  Location: [facility] OR;  Service: Open Heart Surgery;  Laterality: N/A;  . RADIAL ARTERY HARVEST Left 05/29/2016   Procedure: RADIAL ARTERY HARVEST;  Surgeon: Alleen BorneBryan K Bartle, MD;  Location: [facility] OR;  Service: Open Heart Surgery;  Laterality: Left;  . TEE WITHOUT CARDIOVERSION N/A 05/29/2016   Procedure: TRANSESOPHAGEAL ECHOCARDIOGRAM (TEE);  Surgeon: Alleen BorneBryan K Bartle, MD;  [facility];  Service: Open Heart Surgery;  Laterality: N/A;     Allergies  Allergies  Allergen Reactions  . Lipitor [Atorvastatin] Other (See Comments)    Myalgia   . Penicillins Rash    Has patient had  a PCN reaction causing immediate rash, facial/tongue/throat swelling, SOB or lightheadedness with hypotension: Yes Has patient had a PCN reaction causing severe rash involving mucus membranes or skin necrosis: Yes Has patient had a PCN reaction that required hospitalization No Has patient had a PCN reaction occurring within the last 10 years: Non  If all of the above answers are "NO", then may proceed with Cephalosporin use.   . Zocor [Simvastatin] Other (See Comments)    Myalgias    History of Present Illness    Dennis QuickMarshall Wynder is a 57 y.o. male with a history of HTN, HL, CAD s/p 3v CABG in 2015, who is being seen today for the evaluation of chest pain and syncope at the request of Dr. Clyde LundborgNiu.  The patient reports that he has had constant chest pain, 2-3/10 in intensity since the time of his CABG in 2015 - reports that it feels like his chest wall is hurting rather than his heart. More recently he reports that with increased exertion, he has increased chest pain, up to 5-7 out of 10. He is unsure whether this increased pain is different in nature than the resting pain but notes that its is more severe. Pain is also worsened by movements, such as arm movements which stretch the chest. He is again unsure whether this is the same as his exertional symptoms. When he works out in the yard, he develops the increased chest pain and this does not reliably resolve with rest. Reports it  can still be present when going to bed at night.   The patient reports that he underwent a stress test on 5/24 due to these ongoing symptoms. This showed a normal EF, and a small area of mild inferior wall ischemia at the base with 1 mm up-sloping ST segment depression with exercise. He was started on imdur. On the day of presentation, the patient reports drinking approximately 6 beers and working out in the yard most of the day. He developed recurrent chest pain similar to symptoms described above. Additionally, while shoveling  gravel, he became significantly nauseous and vomited several times. In this setting, he knelt to the ground and then woke up on the ground, believes he lost consciousness for several seconds (son was on the porch with friends, so does not think it was longer than this). Given these symptoms, he was brought to the ED for further evaluation.  In the ED, he was hemodynamically stable. ECG with non-specific TW changes but no acute ischemia. Troponin x3 have been undetectable.    Inpatient Medications    . aspirin  324 mg Oral Daily  . cholecalciferol  5,000 Units Oral Daily  . ibuprofen  200 mg Oral TID  . isosorbide mononitrate  30 mg Oral Daily  . LORazepam  0-4 mg Intravenous Q6H   Or  . LORazepam  0-4 mg Oral Q6H  . [START ON 11/23/2017] LORazepam  0-4 mg Intravenous Q12H   Or  . [START ON 11/23/2017] LORazepam  0-4 mg Oral Q12H  . metoprolol tartrate  12.5 mg Oral BID  . multivitamin with minerals  1 tablet Oral Daily  . pantoprazole  40 mg Oral Daily  . rosuvastatin  20 mg Oral Daily  . thiamine  100 mg Oral Daily   Or  . thiamine  100 mg Intravenous Daily    Family History    Family History  Problem Relation Age of Onset  . Pulmonary disease Maternal Aunt   . CAD Neg Hx    indicated that his mother is alive. He indicated that his father is alive. He indicated that his brother is alive. He indicated that his maternal aunt is deceased. He indicated that the status of his neg hx is unknown.   Social History    Social History   Socioeconomic History  . Marital status: Divorced    Spouse name: Not on file  . Number of children: Not on file  . Years of education: Not on file  . Highest education level: Not on file  Occupational History  . 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 Financial resource strain: Not on file  . Food insecurity:    Worry: Not on file    Inability: Not on file  . Transportation needs:     Medical: Not on file    Non-medical: Not on file  Tobacco Use  . Smoking status: Former Smoker    Packs/day: 0.50  . Smokeless tobacco: Never Used  Substance and Sexual Activity  . Alcohol use: Yes    Alcohol/week: 3.6 oz    Types: 6 Cans of beer per week  . Drug use: No  . Sexual activity: Not on file  Lifestyle  . Physical activity:    Days per week: Not on file    Minutes per session: Not on file  . Stress: Not on file  Relationships  . Social connections:    Talks on phone:  Not on file    Gets together: Not on file    Attends religious service: Not on file    Active member of club or organization: Not on file    Attends meetings of clubs or organizations: Not on file    Relationship status: Not on file  . Intimate partner violence:    Fear of current or ex partner: Not on file    Emotionally abused: Not on file    Physically abused: Not on file    Forced sexual activity: Not on file  Other Topics Concern  . Not on file  Social History Narrative   Pt lives with son.      Review of Systems    General:  No chills, fever, night sweats or weight changes.  Cardiovascular:  As per HPI Dermatological: No rash, lesions/masses Respiratory: No cough, dyspnea Urologic: No hematuria, dysuria Abdominal:   No diarrhea, bright red blood per rectum, melena, or hematemesis Neurologic:  No visual changes, wkns, changes in mental status. All other systems reviewed and are otherwise negative except as noted above.  Physical Exam    Blood pressure (!) 136/93, pulse 68, temperature 97.9 F (36.6 C), temperature source Oral, resp. rate 14, height 5\' 8"  (1.727 m), weight 95.4 kg (210 lb 4.8 oz), SpO2 97 %.  General: Pleasant, NAD Psych: Normal affect. Neuro: Alert and oriented X 3. Moves all extremities spontaneously. HEENT: Normal  Neck: Supple without bruits or JVD. Lungs:  Resp regular and unlabored, CTA. Heart: RRR no s3, s4, or murmurs. Significant pain with palpation of  sternal area. Abdomen: Soft, non-tender, non-distended, BS + x 4.  Extremities: No clubbing, cyanosis. Trace edema bilaterally. DP/PT/Radials 2+ and equal bilaterally.  Labs    Troponin [facility] Test) Recent Labs    11/20/17 2311  TROPIPOC 0.00   Recent Labs    11/21/17 0454 11/21/17 1010 11/21/17 1654  TROPONINI <0.03 <0.03 <0.03   Lab Results  Component Value Date   WBC 5.8 11/21/2017   HGB 13.4 11/21/2017   HCT 40.0 11/21/2017   MCV 91.7 11/21/2017   PLT 304 11/21/2017    Recent Labs  Lab 11/21/17 0454  NA 139  K 3.6  CL 105  CO2 22  BUN 5*  CREATININE 0.91  CALCIUM 8.6*  GLUCOSE 97   Lab Results  Component Value Date   CHOL 219 (H) 11/21/2017   HDL 39 (L) 11/21/2017   LDLCALC UNABLE TO CALCULATE IF TRIGLYCERIDE OVER 400 mg/dL 16/03/9603   TRIG 540 (H) 11/21/2017   No results found for: [facility]   Radiology Studies    Dg Chest 2 View  Result Date: 11/20/2017 CLINICAL DATA:  57 year old male status post bypass surgery 6 months ago with intermittent chest pain since that time. Mid left-sided chest pain and shortness breath on exertion. EXAM: CHEST - 2 VIEW COMPARISON:  Chest x-ray 07/01/2016. FINDINGS: Lung volumes are normal. No consolidative airspace disease. No pleural effusions. No pneumothorax. No pulmonary nodule or mass noted. Pulmonary vasculature and the cardiomediastinal silhouette are within normal limits. Status post median sternotomy for CABG. IMPRESSION: 1.  No radiographic evidence of acute cardiopulmonary disease. Electronically Signed   By: Trudie Reed M.D.   On: 11/20/2017 23:28   Mr Brain Wo Contrast  Result Date: 11/21/2017 CLINICAL DATA:  Syncope. EXAM: MRI HEAD WITHOUT CONTRAST TECHNIQUE: Multiplanar, multiecho pulse sequences of the brain and surrounding structures were obtained without intravenous contrast. COMPARISON:  CT 10/30/2012 FINDINGS: Brain: Brain has normal  appearance without evidence of malformation, atrophy, old or  acute small or large vessel infarction, mass lesion, hemorrhage, hydrocephalus or extra-axial collection. Vascular: Major vessels at the base of the brain show flow. Venous sinuses appear patent. Skull and upper cervical spine: Normal. Sinuses/Orbits: Ordinary mucosal inflammatory changes throughout the paranasal sinuses. Orbits negative. Other: None significant. IMPRESSION: Normal appearance of the brain.  No cause of syncope identified. Mucosal inflammation of the sinuses. Electronically Signed   By: Paulina Fusi M.D.   On: 11/21/2017 07:02    ECG & Cardiac Imaging    ECG 6/2: Sinus rhythm. Non-specific T-wave changes, no acute ischemia.   Assessment & Plan    Clarke Peretz is a 57 y.o. male with a history of HTN, HL, CAD s/p 3v CABG in 2015, who is being seen today for the evaluation of chest pain and syncope at the request of Dr. Clyde Lundborg.  # Chest pain # CAD s/p 3v CABG Patient has both atypical and typical features of chest pain. Concerning that pain increases in intensity with exertion, but cannot fully separate this from the worsened pain with movements such as raising his arms thus not clearly 2/2 to the exertion itself. Additionally, patient with chest pain and syncope on the day of admission and found to have significantly elevated EtOH level as well as positive for cocaine which could certainly contribute. However, he does have known CAD and recent stress test did show some mild abnormalities. Troponin undetectable here. - Complex scenario in patient with known abnormalities but not clear story and with + cocaine which could certainly contribute. - Will discuss further with patient's primary cardiologist whether to continue to optimize medical management at this time versus pursue cardiac cath while inpatient.  - Cont ASA, BB, statin - Encourage cocaine cessation  # Syncope Unclear etiology but may be related to working outside in heat, significant EtOH consumption. Patient does not note  palpitations or increased chest pain at that time. - TTE pending - Cont telemetry   Signed, Starlyn Skeans, MD 11/22/2017, 12:53 AM  For questions or updates, please contact   Please consult [email or web address] for contact info under Cardiology/STEMI.

## 2017-11-24 NOTE — Progress Notes (Signed)
Patient is a 57 year old gentleman admitted this morning with chest pain. Suspected cocaine abuse as a cause. He also has alcohol abuse as well as tobacco abuse. Patient also reported having syncopal episodes. No evidence of CVA. He has history of coronary artery disease with bypass grafting. Currently on nitroglycerin, morphine, aspirin and statin. We will continue to cycle his enzymes and check echocardiogram. Cardiology consultation done. Patient also has GERD so we'll continue his PPIs. We will follow cardiology recommendations.

## 2017-12-31 ENCOUNTER — Ambulatory Visit: Payer: 59 | Admitting: Cardiovascular Disease

## 2018-02-22 ENCOUNTER — Ambulatory Visit: Payer: Self-pay | Admitting: Cardiovascular Disease

## 2018-09-04 ENCOUNTER — Other Ambulatory Visit: Payer: Self-pay

## 2018-09-04 ENCOUNTER — Encounter (HOSPITAL_COMMUNITY): Payer: Self-pay | Admitting: Emergency Medicine

## 2018-09-04 ENCOUNTER — Emergency Department (HOSPITAL_COMMUNITY)
Admission: EM | Admit: 2018-09-04 | Discharge: 2018-09-04 | Discharge status: Against Medical Advice | Payer: Self-pay | Attending: Emergency Medicine | Admitting: Emergency Medicine

## 2018-09-04 DIAGNOSIS — Z5321 Procedure and treatment not carried out due to patient leaving prior to being seen by health care provider: Secondary | ICD-10-CM | POA: Insufficient documentation

## 2018-09-04 NOTE — ED Notes (Signed)
Pt left against medical advice. This tech asked pt to consider staying but pt was reluctant to stay. RN notified.

## 2018-09-04 NOTE — ED Notes (Signed)
Patient was found leaving the department despite being asked to stay prior to disposition being placed. Patient ambulated out with a steady gait and NAD.

## 2018-09-04 NOTE — ED Triage Notes (Addendum)
C/o fever, chills, non-productive cough, congestion, and generalized body aches since Thursday night.

## 2019-01-03 ENCOUNTER — Telehealth: Payer: Self-pay

## 2019-01-03 NOTE — Telephone Encounter (Signed)
Wife called reporting that patient has fever and difficulty breathing.  She states, "He does not feel like he can breathe when he is laying down."  She suspects husband has Covid but he does not have a PCP.  Advised her to call EMS or take patient to ED.

## 2019-07-13 ENCOUNTER — Ambulatory Visit: Payer: Self-pay | Admitting: Orthopaedic Surgery

## 2019-07-13 ENCOUNTER — Encounter: Payer: Self-pay | Admitting: Radiology

## 2019-07-13 ENCOUNTER — Other Ambulatory Visit: Payer: Self-pay

## 2019-07-13 NOTE — Progress Notes (Unsigned)
Patient came into office today for appt for his ankle pain/swelling.  His temp is 101.2 this AM, and he just finished a zpack for URI.  Per patient report- neg rapid COVID test last week. We have asked patient to reschedule when feeling better, approx 2 weeks from now. He cannot make this appt today, card given to him and he will call us to reschedule.  Advised him to seek treatment at the ED if needed for this problem between now and then. He understands.

## 2021-04-18 ENCOUNTER — Emergency Department (HOSPITAL_BASED_OUTPATIENT_CLINIC_OR_DEPARTMENT_OTHER)
Admission: EM | Admit: 2021-04-18 | Discharge: 2021-04-18 | Disposition: A | Discharge status: Home / Self Care | Payer: Commercial Managed Care - PPO | Attending: Emergency Medicine | Admitting: Emergency Medicine

## 2021-04-18 ENCOUNTER — Encounter (HOSPITAL_BASED_OUTPATIENT_CLINIC_OR_DEPARTMENT_OTHER): Payer: Self-pay | Admitting: *Deleted

## 2021-04-18 ENCOUNTER — Emergency Department (HOSPITAL_BASED_OUTPATIENT_CLINIC_OR_DEPARTMENT_OTHER): Payer: Commercial Managed Care - PPO

## 2021-04-18 ENCOUNTER — Other Ambulatory Visit: Payer: Self-pay

## 2021-04-18 ENCOUNTER — Ambulatory Visit
Admission: EM | Admit: 2021-04-18 | Discharge: 2021-04-18 | Disposition: A | Discharge status: Nursing Home (Includes ICF) | Payer: Self-pay

## 2021-04-18 DIAGNOSIS — S62666A Nondisplaced fracture of distal phalanx of right little finger, initial encounter for closed fracture: Secondary | ICD-10-CM | POA: Insufficient documentation

## 2021-04-18 DIAGNOSIS — Z79899 Other long term (current) drug therapy: Secondary | ICD-10-CM | POA: Insufficient documentation

## 2021-04-18 DIAGNOSIS — S62639A Displaced fracture of distal phalanx of unspecified finger, initial encounter for closed fracture: Secondary | ICD-10-CM

## 2021-04-18 DIAGNOSIS — Z7982 Long term (current) use of aspirin: Secondary | ICD-10-CM | POA: Insufficient documentation

## 2021-04-18 DIAGNOSIS — S61216A Laceration without foreign body of right little finger without damage to nail, initial encounter: Secondary | ICD-10-CM | POA: Diagnosis not present

## 2021-04-18 DIAGNOSIS — I10 Essential (primary) hypertension: Secondary | ICD-10-CM | POA: Diagnosis not present

## 2021-04-18 DIAGNOSIS — Y990 Civilian activity done for income or pay: Secondary | ICD-10-CM | POA: Diagnosis not present

## 2021-04-18 DIAGNOSIS — Z951 Presence of aortocoronary bypass graft: Secondary | ICD-10-CM | POA: Diagnosis not present

## 2021-04-18 DIAGNOSIS — W230XXA Caught, crushed, jammed, or pinched between moving objects, initial encounter: Secondary | ICD-10-CM | POA: Diagnosis not present

## 2021-04-18 DIAGNOSIS — S62666B Nondisplaced fracture of distal phalanx of right little finger, initial encounter for open fracture: Secondary | ICD-10-CM

## 2021-04-18 DIAGNOSIS — S6991XA Unspecified injury of right wrist, hand and finger(s), initial encounter: Secondary | ICD-10-CM | POA: Diagnosis present

## 2021-04-18 DIAGNOSIS — Z87891 Personal history of nicotine dependence: Secondary | ICD-10-CM | POA: Insufficient documentation

## 2021-04-18 MED ORDER — DOXYCYCLINE HYCLATE 100 MG PO TABS
100.0000 mg | ORAL_TABLET | Freq: Once | ORAL | Status: AC
  Administered 2021-04-18: 100 mg via ORAL
  Filled 2021-04-18: qty 1

## 2021-04-18 MED ORDER — IBUPROFEN 800 MG PO TABS
800.0000 mg | ORAL_TABLET | Freq: Once | ORAL | Status: AC
  Administered 2021-04-18: 800 mg via ORAL
  Filled 2021-04-18: qty 1

## 2021-04-18 MED ORDER — DOXYCYCLINE HYCLATE 100 MG PO CAPS: 100.0000 mg | ORAL_CAPSULE | Freq: Two times a day (BID) | ORAL | 0 refills | Status: AC

## 2021-04-18 NOTE — ED Provider Notes (Signed)
[facility] EMERGENCY DEPARTMENT Provider Note   CSN: [id number] Arrival date & time: 04/18/21  1701     History Chief Complaint  Patient presents with   Hand Injury    Dennis Winters is a 60 y.o. male who is right-handed presenting to emerge apartment crush injury to his right fifth finger.  He reports that he accidentally bashed his finger between a door jam and a sheet of metal while at work today.  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  He reports he went to an urgent care and was told to come to the ED for evaluation because he had a laceration to his finger.  He is not on blood thinners.  He reports his tetanus is up-to-date.  HPI     Past Medical History:  Diagnosis Date   Coronary artery disease    High cholesterol    Hypertension     Patient Active Problem List   Diagnosis Date Noted   Chest pain 11/21/2017   Alcohol use 11/21/2017   Syncope 11/21/2017   S/P CABG x 3 05/29/2016   NSTEMI (non-ST elevated myocardial infarction) [facility])    Chest pain with moderate risk of acute coronary syndrome 05/27/2016   High cholesterol    Essential hypertension    Calculus of gallbladder with acute cholecystitis, without mention of obstruction 04/19/2013    Past Surgical History:  Procedure Laterality Date   ANKLE SURGERY     CARDIAC CATHETERIZATION N/A 05/28/2016   Procedure: Left Heart Cath and Coronary Angiography;  Surgeon: Kathleene Hazel, MD;  Location: [facility] INVASIVE CV LAB;  Service: Cardiovascular;  Laterality: N/A;   CHOLECYSTECTOMY N/A 04/13/2013   Procedure: LAPAROSCOPIC CHOLECYSTECTOMY , attempted IOC.;  Surgeon: Ernestene Mention, MD;  Location: [facility] ORS;  Service: General;  Laterality: N/A;   CORONARY ARTERY BYPASS GRAFT     CORONARY ARTERY BYPASS GRAFT N/A 05/29/2016   Procedure: CORONARY ARTERY BYPASS GRAFTING (CABG) x 3 with bilateral internal mammary arteries and left radial artery grafting LIMA-LAD FREE RIMA-RCA LEFT  RADIAL-OM;  Surgeon: Alleen Borne, MD;  Location: [facility] OR;  Service: Open Heart Surgery;  Laterality: N/A;   RADIAL ARTERY HARVEST Left 05/29/2016   Procedure: RADIAL ARTERY HARVEST;  Surgeon: Alleen Borne, MD;  Location: [facility] OR;  Service: Open Heart Surgery;  Laterality: Left;   TEE WITHOUT CARDIOVERSION N/A 05/29/2016   Procedure: TRANSESOPHAGEAL ECHOCARDIOGRAM (TEE);  Surgeon: Alleen Borne, MD;  Location: [facility] OR;  Service: Open Heart Surgery;  Laterality: N/A;       Family History  Problem Relation Age of Onset   Pulmonary disease Maternal Aunt    CAD Neg Hx     Social History   Tobacco Use   Smoking status: Former    Packs/day: 0.50    Types: Cigarettes   Smokeless tobacco: Never  Vaping Use   Vaping Use: Never used  Substance Use Topics   Alcohol use: Yes    Alcohol/week: 6.0 standard drinks    Types: 6 Cans of beer per week   Drug use: No    Home Medications Prior to Admission medications   Medication Sig Start Date End Date Taking? Authorizing Provider  doxycycline (VIBRAMYCIN) 100 MG capsule Take 1 capsule (100 mg total) by mouth 2 (two) times daily for 7 days. 04/18/21 04/25/21 Yes Terald Sleeper, MD  aspirin EC 81 MG tablet Take 1 tablet (81 mg total) by mouth daily. 07/20/16   Croitoru,  Mihai, MD  carvedilol (COREG) 3.125 MG tablet Take 1 tablet (3.125 mg total) by mouth 2 (two) times daily. 11/22/17 11/22/18  Joseph Art, DO  Cholecalciferol ([employer] VIT D 5000 HIGH-POTENCY) 5000 units capsule Take 5,000 Units by mouth daily.    [provider]  isosorbide mononitrate (IMDUR) 30 MG 24 hr tablet Take 1 tablet (30 mg total) by mouth daily. 11/18/17   Abelino Derrick, PA-C  Multiple Vitamin (MULTIVITAMIN WITH MINERALS) TABS tablet Take 1 tablet by mouth daily.    [provider]  nitroGLYCERIN (NITROSTAT) 0.4 MG SL tablet Place 1 tablet (0.4 mg total) under the tongue every 5 (five) minutes as needed for chest pain. 11/01/17 01/30/18  Abelino Derrick, PA-C   pantoprazole (PROTONIX) 40 MG tablet Take 1 tablet (40 mg total) by mouth daily. 06/04/16   Barrett, Erin R, PA-C  rosuvastatin (CRESTOR) 20 MG tablet Take 20 mg by mouth daily.    [provider]    Allergies    Lipitor [atorvastatin], Penicillins, and Zocor [simvastatin]  Review of Systems   Review of Systems  Constitutional:  Negative for chills and fever.  Respiratory:  Negative for cough and shortness of breath.   Cardiovascular:  Negative for chest pain and palpitations.  Gastrointestinal:  Negative for abdominal pain and vomiting.  Musculoskeletal:  Positive for arthralgias and myalgias.  Skin:  Positive for rash and wound.  Neurological:  Negative for syncope and headaches.  All other systems reviewed and are negative.  Physical Exam Updated Vital Signs BP (!) 157/93   Pulse 69   Temp 98.5 F (36.9 C) (Oral)   Resp 18   Ht 5\' 8"  (1.727 m)   Wt 102.1 kg   SpO2 97%   BMI 34.21 kg/m   Physical Exam Constitutional:      General: He is not in acute distress. HENT:     Head: Normocephalic and atraumatic.  Eyes:     Conjunctiva/sclera: Conjunctivae normal.     Pupils: Pupils are equal, round, and reactive to light.  Cardiovascular:     Rate and Rhythm: Normal rate and regular rhythm.  Pulmonary:     Effort: Pulmonary effort is normal. No respiratory distress.  Skin:    General: Skin is warm and dry.     Comments: Linear laceration overlying distal flexor DIP right 5th finger, no active bleeding Edema of the distal finger  Neurological:     General: No focal deficit present.     Mental Status: He is alert. Mental status is at baseline.    ED Results / Procedures / Treatments   Labs (all labs ordered are listed, but only abnormal results are displayed) Labs Reviewed - No data to display  EKG None  Radiology DG Finger Little Right  Result Date: 04/18/2021 CLINICAL DATA:  Crush injury distal aspect right fifth digit EXAM: RIGHT LITTLE FINGER 2+V  COMPARISON:  None. FINDINGS: Frontal, oblique, and lateral views of the right fifth digit are obtained. There may be a minimally displaced fracture through the ulnar aspect of the distal tuft of the fifth distal phalanx. This is only seen on the frontal view. No other acute displaced fractures. Mild interphalangeal joint space narrowing. Diffuse soft tissue swelling. IMPRESSION: 1. Suspected minimally displaced fracture through the ulnar aspect distal tuft fifth distal phalanx. 2. Soft tissue swelling. Electronically Signed   By: 04/20/2021 M.D.   On: 04/18/2021 18:12    Procedures .10/30/2022Laceration Repair  Date/Time: 04/19/2021 10:04 AM Performed  by: Terald Sleeper, MD Authorized by: Terald Sleeper, MD   Consent:    Consent obtained:  Verbal   Consent given by:  Patient   Risks, benefits, and alternatives were discussed: yes     Risks discussed:  Infection, poor cosmetic result, pain and need for additional repair Universal protocol:    Procedure explained and questions answered to patient or proxy's satisfaction: yes     Relevant documents present and verified: yes     Immediately prior to procedure, a time out was called: yes     Patient identity confirmed:  Arm band Anesthesia:    Anesthesia method:  None Laceration details:    Location:  Finger   Finger location:  R small finger   Length (cm):  1   Depth (mm):  2 Pre-procedure details:    Preparation:  Patient was prepped and draped in usual sterile fashion Exploration:    Contaminated: no   Treatment:    Area cleansed with:  Saline Skin repair:    Repair method:  Tissue adhesive Approximation:    Approximation:  Close Repair type:    Repair type:  Simple Post-procedure details:    Dressing:  Non-adherent dressing   Procedure completion:  Tolerated well, no immediate complications   Medications Ordered in ED Medications  ibuprofen (ADVIL) tablet 800 mg (800 mg Oral Given 04/18/21 2009)  doxycycline (VIBRA-TABS)  tablet 100 mg (100 mg Oral Given 04/18/21 2009)    ED Course  I have reviewed the triage vital signs and the nursing notes.  Pertinent labs & imaging results that were available during my care of the patient were reviewed by me and considered in my medical decision making (see chart for details).  Patient is here with crush injury to his right fifth finger.  He has 2 small 1 cm lacerations over the site, the more proximal of which is well aligned and would likely heal with glue.  The lateral wound is not amenable to suturing at this time due to the amount of edema in the underlying tissue, bulging the wound, but it is small enough that I suspect would heal with adhesive.  I copiously irrigated the wound to clean them.  I applied Dermabond.  We achieved hemostasis.  A finger splint was applied.  He was advised not to use that hand.  We started him on doxycycline given that this is an open wound over a distal fracture.  I personally reviewed his finger x-ray.  Given that he works with his hand, that he is right-handed, that this is an open fracture, I advised f/u with an orthopedic hand surgeon in 1 week for wound and tendon reassessment.  He verbalized understanding.  Tetanus is already up to date per patient report.    Final Clinical Impression(s) / ED Diagnoses Final diagnoses:  Open nondisplaced fracture of distal phalanx of right little finger, initial encounter  Closed fracture of tuft of distal phalanx of finger    Rx / DC Orders ED Discharge Orders          Ordered    doxycycline (VIBRAMYCIN) 100 MG capsule  2 times daily        04/18/21 2007             Terald Sleeper, MD 04/19/21 1008

## 2021-04-18 NOTE — ED Triage Notes (Signed)
Mashed his right finger in a metal door jam 2 hours ago. Laceration noted.

## 2021-04-18 NOTE — Discharge Instructions (Addendum)
You should not use your right hand for work, or do anything to disturb your finger for 4-6 weeks.   You will take antibiotics for 1 week to prevent infection.  We put glue over your wounds today.  The wound should heal from underneath.  Keep your hand dry for 2 days, afterwards you can wash your hands normally.  The glue will dissolve slowly over 7-10 days.  You should STOP taking the clindamycin prescription and Start taking doxycycline instead.

## 2021-05-09 ENCOUNTER — Ambulatory Visit: Payer: Commercial Managed Care - PPO | Admitting: Nurse Practitioner

## 2021-05-23 ENCOUNTER — Other Ambulatory Visit: Payer: Self-pay

## 2021-05-23 ENCOUNTER — Ambulatory Visit: Payer: Commercial Managed Care - PPO | Admitting: Nurse Practitioner

## 2021-05-23 ENCOUNTER — Encounter: Payer: Self-pay | Admitting: Nurse Practitioner

## 2021-05-23 VITALS — BP 157/70 | HR 62 | Temp 97.7°F | Ht 68.0 in | Wt 227.7 lb

## 2021-05-23 DIAGNOSIS — Z6834 Body mass index (BMI) 34.0-34.9, adult: Secondary | ICD-10-CM

## 2021-05-23 DIAGNOSIS — I10 Essential (primary) hypertension: Secondary | ICD-10-CM

## 2021-05-23 DIAGNOSIS — Z7689 Persons encountering health services in other specified circumstances: Secondary | ICD-10-CM | POA: Diagnosis not present

## 2021-05-23 DIAGNOSIS — I2583 Coronary atherosclerosis due to lipid rich plaque: Secondary | ICD-10-CM

## 2021-05-23 DIAGNOSIS — I2510 Atherosclerotic heart disease of native coronary artery without angina pectoris: Secondary | ICD-10-CM

## 2021-05-23 NOTE — Progress Notes (Signed)
New Patient Office Visit  Subjective:  Patient ID: Dennis Winters, male    DOB: [birth year]  Age: 60 y.o. MRN: [id number]  CC:  Chief Complaint  Patient presents with   New Patient (Initial Visit)    HPI Dennis Winters presents to establish new primary care provider. He does have history of CAD. Had to have triple bypass surgery in 2017. He has not seen his cardiologist in four years. He is due to see the cardiologist later this month. Blood pressure is elevated today. He does not take any medication. States that he did take cold medicine this morning and that may be contributing to the BP elevation.  The patient denies chest pain, chest pressure, or shortness of breath.  He denies changes in bowel or bladder habits.  Past Medical History:  Diagnosis Date   Coronary artery disease    High cholesterol    Hypertension     Past Surgical History:  Procedure Laterality Date   ANKLE SURGERY     CARDIAC CATHETERIZATION N/A 05/28/2016   Procedure: Left Heart Cath and Coronary Angiography;  Surgeon: Kathleene Hazel, MD;  Location: [facility] INVASIVE CV LAB;  Service: Cardiovascular;  Laterality: N/A;   CHOLECYSTECTOMY N/A 04/13/2013   Procedure: LAPAROSCOPIC CHOLECYSTECTOMY , attempted IOC.;  Surgeon: Ernestene Mention, MD;  Location: [facility] ORS;  Service: General;  Laterality: N/A;   CORONARY ARTERY BYPASS GRAFT     CORONARY ARTERY BYPASS GRAFT N/A 05/29/2016   Procedure: CORONARY ARTERY BYPASS GRAFTING (CABG) x 3 with bilateral internal mammary arteries and left radial artery grafting LIMA-LAD FREE RIMA-RCA LEFT RADIAL-OM;  Surgeon: Alleen Borne, MD;  Location: [facility] OR;  Service: Open Heart Surgery;  Laterality: N/A;   RADIAL ARTERY HARVEST Left 05/29/2016   Procedure: RADIAL ARTERY HARVEST;  Surgeon: Alleen Borne, MD;  Location: [facility] OR;  Service: Open Heart Surgery;  Laterality: Left;   TEE WITHOUT CARDIOVERSION N/A 05/29/2016   Procedure: TRANSESOPHAGEAL ECHOCARDIOGRAM (TEE);  Surgeon: Alleen Borne, MD;  Location: [facility] OR;  Service: Open Heart Surgery;  Laterality: N/A;    Family History  Problem Relation Age of Onset   High Cholesterol Father    High blood pressure Father    Pulmonary disease Maternal Aunt    CAD Neg Hx     Social History   Socioeconomic History   Marital status: Divorced    Spouse name: Not on file   Number of children: Not on file   Years of education: Not on file   Highest education level: Not on file  Occupational History   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    Social Determinants of Health   Financial Resource Strain: Not on file  Food Insecurity: Not on file  Transportation Needs: Not on file  Physical Activity: Not on file  Stress: Not on file  Social Connections: Not on file  Intimate Partner Violence: Not  on file    ROS Review of Systems  Constitutional:  Positive for fatigue. Negative for activity change, chills and fever.  HENT:  Positive for congestion and postnasal drip. Negative for rhinorrhea, sinus pressure, sinus pain, sneezing and sore throat.   Eyes: Negative.   Respiratory:  Negative for cough, shortness of breath and wheezing.   Cardiovascular:  Negative for chest pain and palpitations.       History of cardiovascular disease.  Gastrointestinal:  Negative for constipation, diarrhea, nausea and vomiting.  Endocrine: Negative for cold intolerance, heat intolerance, polydipsia and polyuria.  Genitourinary:  Negative for dysuria, frequency and urgency.   Musculoskeletal:  Negative for back pain and myalgias.  Skin:  Negative for rash.  Allergic/Immunologic: Negative for environmental allergies.  Neurological:  Negative for dizziness, weakness and headaches.  Psychiatric/Behavioral:  The patient is not nervous/anxious.    Objective:   Today's Vitals   05/23/21 0953  BP: (!) 157/70  Pulse: 62  Temp: 97.7 F (36.5 C)  SpO2: 98%  Weight: 227 lb 11.2 oz (103.3 kg)  Height: 5\' 8"  (1.727 m)   Body mass index is 34.62 kg/m.   Physical Exam Vitals and nursing note reviewed.  Constitutional:      Appearance: Normal appearance. He is well-developed.  HENT:     Head: Normocephalic and atraumatic.     Mouth/Throat:     Mouth: Mucous membranes are moist.     Pharynx: Oropharynx is clear.  Eyes:     Conjunctiva/sclera: Conjunctivae normal.     Pupils: Pupils are equal, round, and reactive to light.  Cardiovascular:     Rate and Rhythm: Normal rate and regular rhythm.     Pulses: Normal pulses.     Heart sounds: Normal heart sounds.  Pulmonary:     Effort: Pulmonary effort is normal.     Breath sounds: Normal breath sounds.  Abdominal:     Palpations: Abdomen is soft.  Musculoskeletal:        General: Normal range of motion.     Cervical back: Normal range of motion and neck supple.  Lymphadenopathy:     Cervical: No cervical adenopathy.  Skin:    General: Skin is warm and dry.     Capillary Refill: Capillary refill takes less than 2 seconds.  Neurological:     General: No focal deficit present.     Mental Status: He is alert and oriented to person, place, and time.  Psychiatric:        Mood and Affect: Mood normal.        Behavior: Behavior normal.        Thought Content: Thought content normal.        Judgment: Judgment normal.    Assessment & Plan:  1. Encounter to establish care Appointment today to establish new primary care provider.  We will get records from previous PCP review and update patient chart.  2.  Essential hypertension Generally stable.  Continue medication as prescribed.  3. Coronary artery disease due to lipid rich plaque Follow-up with cardiology as scheduled.  4. Body mass index (BMI) of 34.0-34.9 in adult Encourage patient to limit calorie intake to 2000 cal/day or less.  He should consume a low cholesterol, low-fat diet.  Patient should incorporate exercise into his daily routine.     Problem List Items Addressed This Visit       Cardiovascular and Mediastinum   Essential hypertension   Coronary artery disease due to lipid rich plaque  Other   Body mass index (BMI) of 34.0-34.9 in adult   Other Visit Diagnoses     Encounter to establish care    -  Primary       Outpatient Encounter Medications as of 05/23/2021  Medication Sig   aspirin EC 81 MG tablet Take 1 tablet (81 mg total) by mouth daily.   Cholecalciferol 125 MCG (5000 UT) capsule Take 5,000 Units by mouth daily.   Multiple Vitamin (MULTIVITAMIN WITH MINERALS) TABS tablet Take 1 tablet by mouth daily. (Patient not taking: Reported on 05/30/2021)   pantoprazole (PROTONIX) 40 MG tablet Take 1 tablet (40 mg total) by mouth daily. (Patient not taking: Reported on 05/30/2021)   [DISCONTINUED] carvedilol (COREG) 3.125 MG tablet Take 1 tablet (3.125 mg total) by mouth 2 (two) times daily. (Patient not taking: Reported on 05/30/2021)   [DISCONTINUED] isosorbide mononitrate (IMDUR) 30 MG 24 hr tablet Take 1 tablet (30 mg total) by mouth daily. (Patient not taking: Reported on 05/30/2021)   [DISCONTINUED] nitroGLYCERIN (NITROSTAT) 0.4 MG SL tablet Place 1 tablet (0.4 mg total) under the tongue every 5 (five) minutes as needed for chest pain. (Patient not taking: Reported on 05/30/2021)   [DISCONTINUED] rosuvastatin (CRESTOR) 20 MG tablet Take 20 mg by mouth daily. (Patient not taking: Reported on 05/30/2021)   No facility-administered encounter medications on file as of 05/23/2021.    Follow-up: Return in about 4 weeks  (around 06/20/2021) for health maintenance exam - needs FBW pluss Free T4 and vitamin d and PSA next week.   Carlean Jews, NP  This note was dictated using Conservation officer, historic buildings. Rapid proofreading was performed to expedite the delivery of the information. Despite proofreading, phonetic errors will occur which are common with this voice recognition software. Please take this into consideration. If there are any concerns, please contact our office.

## 2021-05-27 ENCOUNTER — Other Ambulatory Visit: Payer: Self-pay

## 2021-05-27 ENCOUNTER — Other Ambulatory Visit: Payer: Commercial Managed Care - PPO

## 2021-05-27 DIAGNOSIS — E7800 Pure hypercholesterolemia, unspecified: Secondary | ICD-10-CM

## 2021-05-27 DIAGNOSIS — I10 Essential (primary) hypertension: Secondary | ICD-10-CM

## 2021-05-27 DIAGNOSIS — R5383 Other fatigue: Secondary | ICD-10-CM

## 2021-05-27 DIAGNOSIS — Z130 Encounter for screening for diseases of the blood and blood-forming organs and certain disorders involving the immune mechanism: Secondary | ICD-10-CM

## 2021-05-27 DIAGNOSIS — Z125 Encounter for screening for malignant neoplasm of prostate: Secondary | ICD-10-CM

## 2021-05-28 LAB — LIPID PANEL
Chol/HDL Ratio: 5.1 ratio — ABNORMAL HIGH (ref 0.0–5.0)
Cholesterol, Total: 211 mg/dL — ABNORMAL HIGH (ref 100–199)
HDL: 41 mg/dL (ref >39)
LDL Chol Calc (NIH): 137 mg/dL — ABNORMAL HIGH (ref 0–99)
Triglycerides: 183 mg/dL — ABNORMAL HIGH (ref 0–149)
VLDL Cholesterol Cal: 33 mg/dL (ref 5–40)

## 2021-05-28 LAB — COMPREHENSIVE METABOLIC PANEL
ALT: 21 IU/L (ref 0–44)
AST: 19 IU/L (ref 0–40)
Albumin/Globulin Ratio: 1.7 (ref 1.2–2.2)
Albumin: 4.1 g/dL (ref 3.8–4.9)
Alkaline Phosphatase: 83 IU/L (ref 44–121)
BUN/Creatinine Ratio: 12 (ref 9–20)
BUN: 9 mg/dL (ref 6–24)
Bilirubin Total: 0.3 mg/dL (ref 0.0–1.2)
CO2: 21 mmol/L (ref 20–29)
Calcium: 9 mg/dL (ref 8.7–10.2)
Chloride: 106 mmol/L (ref 96–106)
Creatinine, Ser: 0.77 mg/dL (ref 0.76–1.27)
Globulin, Total: 2.4 g/dL (ref 1.5–4.5)
Glucose: 103 mg/dL — ABNORMAL HIGH (ref 70–99)
Potassium: 4.3 mmol/L (ref 3.5–5.2)
Sodium: 143 mmol/L (ref 134–144)
Total Protein: 6.5 g/dL (ref 6.0–8.5)
eGFR: 103 mL/min/{1.73_m2} (ref >59)

## 2021-05-28 LAB — CBC WITH DIFFERENTIAL/PLATELET
Basophils Absolute: 0.1 10*3/uL (ref 0.0–0.2)
Basos: 1 %
EOS (ABSOLUTE): 0.4 10*3/uL (ref 0.0–0.4)
Eos: 9 %
Hematocrit: 51.7 % — ABNORMAL HIGH (ref 37.5–51.0)
Hemoglobin: 17.4 g/dL (ref 13.0–17.7)
Immature Grans (Abs): 0 10*3/uL (ref 0.0–0.1)
Immature Granulocytes: 0 %
Lymphocytes Absolute: 1.5 10*3/uL (ref 0.7–3.1)
Lymphs: 30 %
MCH: 31 pg (ref 26.6–33.0)
MCHC: 33.7 g/dL (ref 31.5–35.7)
MCV: 92 fL (ref 79–97)
Monocytes Absolute: 0.4 10*3/uL (ref 0.1–0.9)
Monocytes: 8 %
Neutrophils Absolute: 2.6 10*3/uL (ref 1.4–7.0)
Neutrophils: 52 %
Platelets: 277 10*3/uL (ref 150–450)
RBC: 5.61 x10E6/uL (ref 4.14–5.80)
RDW: 12.5 % (ref 11.6–15.4)
WBC: 5 10*3/uL (ref 3.4–10.8)

## 2021-05-28 LAB — TSH: TSH: 1.4 u[IU]/mL (ref 0.450–4.500)

## 2021-05-28 LAB — VITAMIN D 25 HYDROXY (VIT D DEFICIENCY, FRACTURES): Vit D, 25-Hydroxy: 16.4 ng/mL — ABNORMAL LOW (ref 30.0–100.0)

## 2021-05-28 LAB — T4, FREE: Free T4: 1.02 ng/dL (ref 0.82–1.77)

## 2021-05-28 LAB — PSA: Prostate Specific Ag, Serum: 1.7 ng/mL (ref 0.0–4.0)

## 2021-05-28 LAB — HEMOGLOBIN A1C
Est. average glucose Bld gHb Est-mCnc: 114 mg/dL
Hgb A1c MFr Bld: 5.6 % (ref 4.8–5.6)

## 2021-05-30 ENCOUNTER — Ambulatory Visit: Payer: Commercial Managed Care - PPO | Admitting: Cardiovascular Disease

## 2021-05-30 ENCOUNTER — Other Ambulatory Visit: Payer: Self-pay

## 2021-05-30 ENCOUNTER — Encounter: Payer: Self-pay | Admitting: Cardiovascular Disease

## 2021-05-30 VITALS — BP 136/76 | HR 67 | Ht 68.0 in | Wt 218.2 lb

## 2021-05-30 DIAGNOSIS — E669 Obesity, unspecified: Secondary | ICD-10-CM | POA: Diagnosis not present

## 2021-05-30 DIAGNOSIS — E785 Hyperlipidemia, unspecified: Secondary | ICD-10-CM | POA: Diagnosis not present

## 2021-05-30 DIAGNOSIS — I10 Essential (primary) hypertension: Secondary | ICD-10-CM

## 2021-05-30 DIAGNOSIS — I2510 Atherosclerotic heart disease of native coronary artery without angina pectoris: Secondary | ICD-10-CM | POA: Diagnosis not present

## 2021-05-30 MED ORDER — ROSUVASTATIN CALCIUM 10 MG PO TABS: 10.0000 mg | ORAL_TABLET | Freq: Every day | ORAL | 3 refills | Status: DC

## 2021-05-30 MED ORDER — COQ10 100 MG PO CAPS: 300.0000 mg | ORAL_CAPSULE | Freq: Every day | ORAL | Status: DC

## 2021-05-30 NOTE — Patient Instructions (Addendum)
Medication Instructions:  DECREASE the Rosuvastatin to 10 mg once daily START CoQ10 300 mg once daily. This is over the counter  *If you need a refill on your cardiac medications before your next appointment, please call your pharmacy*   Lab Work: Your provider would like for you to return in 3 months to have the following labs drawn: fasting lipid. You do not need an appointment for the lab. Once in our office lobby there is a podium where you can sign in and ring the doorbell to alert [place] that you are here. The lab is open from 8:00 am to 4:30 pm; closed for lunch from 12:45pm-1:45pm.  If you have labs (blood work) drawn today and your tests are completely normal, you will receive your results only by: MyChart Message (if you have MyChart) OR A paper copy in the mail If you have any lab test that is abnormal or we need to change your treatment, we will call you to review the results.   Testing/Procedures: None ordered   Follow-Up: At [facility], you and your health needs are our priority.  As part of our continuing mission to provide you with exceptional heart care, we have created designated Provider Care Teams.  These Care Teams include your primary Cardiologist (physician) and Advanced Practice Providers (APPs -  Physician Assistants and Nurse Practitioners) who all work together to provide you with the care you need, when you need it.  We recommend signing up for the patient portal called "MyChart".  Sign up information is provided on this After Visit Summary.  MyChart is used to connect with patients for Virtual Visits (Telemedicine).  Patients are able to view lab/test results, encounter notes, upcoming appointments, etc.  Non-urgent messages can be sent to your provider as well.   To learn more about what you can do with MyChart, go to [email or web address].    Your next appointment:   12 month(s)  The format for your next appointment:   In Person  Provider:   Thurmon Fair, MD

## 2021-05-30 NOTE — Progress Notes (Signed)
Cardiology Office Note    Date:  05/31/2021   ID:  Dennis Winters, DOB 1961-05-12, MRN [id number]  PCP:  Carlean Jews, NP  Cardiologist:   Thurmon Fair, MD   Chief Complaint  Patient presents with   Coronary Artery Disease    History of Present Illness:  Dennis Winters is a 60 y.o. male with CAD for which he underwent three-vessel CABG in 2017 [facility], LIMA to LAD, free RIMA to RCA, free left radial to OM). Also has hyperlipidemia, preserved left ventricular systolic function, hypertension with mild LVH.  This is his first visit back in office in more than 3 years.  He is doing well, but has gained back a lot of weight.  He has not restarted smoking.  He quit at the time of his bypass.  He works as a Emergency planning/management officer in [employer] and does not do a whole lot of heavy physical work himself, but does walk very long distances throughout the day.  He complains of feeling tired, but he has to wake up at 4:00 in the morning, typically works 10-hour days and never takes naps during the day.  He has not had inappropriate hypersomnolence or naps.  He does snore.  His BMI is now 33.  He denies edema, orthopnea, PND or exertional dyspnea.  He does not have angina at rest or with activity (his previous angina was a retrosternal "dull ache").  He denies claudication or erectile dysfunction and has not had any focal neurological events.  He stopped taking the rosuvastatin years ago due to "side effects" he does not remember what these were.  He did have myalgia with both simvastatin and atorvastatin before that.  The lipid profile checked just a few days ago shows his LDL is 137 and his HDL 41, borderline triglycerides at 183.  His fasting blood sugar is borderline also at 103, but his hemoglobin A1c is pretty good at 5.6%.  He has normal renal function.  Previous CT imaging has shown evidence of aortic atherosclerosis.  His most recent echo from 2019 showed mild LVH, EF 65-70% without regional  wall motion abnormalities.  He had a nuclear stress test in 11/12/2017 that suggested a possible small area of inferior wall ischemia, but was a low risk study overall.   Past Medical History:  Diagnosis Date   Coronary artery disease    High cholesterol    Hypertension     Past Surgical History:  Procedure Laterality Date   ANKLE SURGERY     CARDIAC CATHETERIZATION N/A 05/28/2016   Procedure: Left Heart Cath and Coronary Angiography;  Surgeon: Kathleene Hazel, MD;  Location: [facility] INVASIVE CV LAB;  Service: Cardiovascular;  Laterality: N/A;   CHOLECYSTECTOMY N/A 04/13/2013   Procedure: LAPAROSCOPIC CHOLECYSTECTOMY , attempted IOC.;  Surgeon: Ernestene Mention, MD;  Location: [facility] ORS;  Service: General;  Laterality: N/A;   CORONARY ARTERY BYPASS GRAFT     CORONARY ARTERY BYPASS GRAFT N/A 05/29/2016   Procedure: CORONARY ARTERY BYPASS GRAFTING (CABG) x 3 with bilateral internal mammary arteries and left radial artery grafting LIMA-LAD FREE RIMA-RCA LEFT RADIAL-OM;  Surgeon: Alleen Borne, MD;  Location: [facility] OR;  Service: Open Heart Surgery;  Laterality: N/A;   RADIAL ARTERY HARVEST Left 05/29/2016   Procedure: RADIAL ARTERY HARVEST;  Surgeon: Alleen Borne, MD;  Location: [facility] OR;  Service: Open Heart Surgery;  Laterality: Left;   TEE WITHOUT CARDIOVERSION N/A 05/29/2016   Procedure: TRANSESOPHAGEAL ECHOCARDIOGRAM (TEE);  Surgeon: Judie Grieve  Jennefer Bravo, MD;  Location: [facility] OR;  Service: Open Heart Surgery;  Laterality: N/A;    Current Medications: Outpatient Medications Prior to Visit  Medication Sig Dispense Refill   aspirin EC 81 MG tablet Take 1 tablet (81 mg total) by mouth daily. 90 tablet 3   Cholecalciferol 125 MCG (5000 UT) capsule Take 5,000 Units by mouth daily.     Multiple Vitamin (MULTIVITAMIN WITH MINERALS) TABS tablet Take 1 tablet by mouth daily. (Patient not taking: Reported on 05/30/2021)     pantoprazole (PROTONIX) 40 MG tablet Take 1 tablet (40 mg total) by mouth daily. (Patient  not taking: Reported on 05/30/2021) 30 tablet 3   carvedilol (COREG) 3.125 MG tablet Take 1 tablet (3.125 mg total) by mouth 2 (two) times daily. (Patient not taking: Reported on 05/30/2021) 60 tablet 0   isosorbide mononitrate (IMDUR) 30 MG 24 hr tablet Take 1 tablet (30 mg total) by mouth daily. (Patient not taking: Reported on 05/30/2021) 30 tablet 2   nitroGLYCERIN (NITROSTAT) 0.4 MG SL tablet Place 1 tablet (0.4 mg total) under the tongue every 5 (five) minutes as needed for chest pain. (Patient not taking: Reported on 05/30/2021) 25 tablet 3   rosuvastatin (CRESTOR) 20 MG tablet Take 20 mg by mouth daily. (Patient not taking: Reported on 05/30/2021)     No facility-administered medications prior to visit.     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    Social Determinants of Health   Financial Resource Strain: Not on file  Food Insecurity: Not on file  Transportation Needs: Not on file  Physical Activity: Not on file  Stress: Not on file  Social Connections: Not on file     Family History:  The patient's family history includes High Cholesterol in his father; High blood pressure in his father; Pulmonary disease in his maternal aunt.    ROS:   Please see the history of present illness.    ROS All other systems reviewed and are negative.   PHYSICAL EXAM:   VS:  BP 136/76 (BP Location: Left Arm, Patient Position: Sitting, Cuff Size: Large)   Pulse 67   Ht 5\' 8"  (1.727 m)   Wt 218 lb 3.2 oz (99 kg)   SpO2 96%   BMI 33.18 kg/m     General: Alert, oriented x3, no distress, obese Head: no evidence of trauma, PERRL, EOMI, no exophtalmos or lid lag, no myxedema, no xanthelasma; normal ears, nose and oropharynx Neck: normal jugular venous pulsations and no hepatojugular reflux; brisk carotid pulses without delay and no carotid bruits Chest:  clear to auscultation, no signs of consolidation by percussion or palpation, normal fremitus, symmetrical and full respiratory excursions Cardiovascular: normal position and quality of the apical impulse, regular rhythm, normal first and second heart sounds, no murmurs, rubs or gallops Abdomen: no tenderness or distention, no masses by palpation, no abnormal pulsatility or arterial bruits, normal bowel sounds, no hepatosplenomegaly Extremities: no clubbing, cyanosis or edema; 2+ radial, ulnar and brachial pulses bilaterally; 2+ right femoral, posterior tibial and dorsalis pedis pulses; 2+ left femoral, posterior tibial and dorsalis pedis pulses; no subclavian or femoral bruits Neurological: grossly nonfocal Psych: Normal mood and affect   Wt Readings from Last 3 Encounters:  05/30/21 218 lb 3.2 oz (99 kg)  05/23/21 227 lb 11.2 oz (103.3 kg)  04/18/21 225 lb (102.1 kg)      Studies/Labs Reviewed:   Nuclear stress test 11/12/2017: Nuclear stress EF: 60%. The left ventricular ejection fraction is normal (55-65%). Blood pressure demonstrated a hypertensive response to exercise. Upsloping ST segment depression ST segment depression of 1 mm was noted during stress in the II, III and aVF leads. Findings consistent with ischemia. This is a low risk study.   Small area of mild  inferior wall ischemia at the base Patient also had 1 mm up-sloping ST segment depression with exercise EF normal 60%  ECHO 11/22/2017: - Left ventricle: The cavity size was normal. Wall thickness was    increased in a pattern of mild LVH. Systolic function was    vigorous. The estimated ejection fraction was in the range of 65%    to 70%. Wall motion was normal; there were no regional wall    motion abnormalities. There was no evidence of elevated    ventricular filling pressure by Doppler parameters.  - Aorta: Aortic root dimension: 38 mm (ED).  - Ascending aorta: The ascending aorta was mildly dilated.   EKG:  EKG is ordered today.  It shows normal sinus rhythm, a rather prominent R wave in lead V2, T wave inversion V1-V2, no other repolarization changes.  These changes are similar to June 2019 QTc 424 ms  Recent Labs: 05/27/2021: ALT 21; BUN 9; Creatinine, Ser 0.77; Hemoglobin 17.4; Platelets 277; Potassium 4.3; Sodium 143; TSH 1.400   Lipid Panel     Component Value Date/Time   CHOL 211 (H) 05/27/2021 0812   TRIG 183 (H) 05/27/2021 0812   HDL 41 05/27/2021 0812   CHOLHDL 5.1 (H) 05/27/2021 0812   CHOLHDL 5.6 11/21/2017 0454   VLDL UNABLE TO CALCULATE IF TRIGLYCERIDE OVER 400 mg/dL 95/62/1308 6578   LDLCALC 137 (H) 05/27/2021 0812   LABVLDL 33 05/27/2021 0812     ASSESSMENT:    1. Coronary artery disease involving native coronary artery of native heart without angina pectoris   2. Essential hypertension   3. Dyslipidemia (high LDL; low HDL)   4. Mild obesity      PLAN:  In order of problems listed above:  CAD s/p CABG: Asymptomatic.  Reasonably active, although he does not really engage in any athletic activity for health.  On aspirin and beta-blocker.  He did not tolerate higher doses of beta-blocker due to fatigue.  He is not on any lipid-lowering medicine. HLP: He has had side effects with simvastatin and atorvastatin (myalgia) as well as with rosuvastatin 20 mg  daily (he does not remember what those side effects were).  We will try low-dose of rosuvastatin with co-Q10.  If this does not work for him, will use Repatha or Praluent.  Target LDL less than 70. HTN: His blood pressure is not he will, but is close to target.  No changes to his medication.  I would much rather we achieve lowering of his blood pressure via weight loss and regular physical exercise as well as avoiding sodium rich foods. Obesity: After his bypass surgery he did a really good job with weight loss and had moved out of obesity range.  He has steadily gained weight since then and is heavier than ever.  Target waistline 34 inches (right now he is wearing 38 inch pants).  Discussed ways to improve his diet by increasing the intake of lean protein, unsaturated fat from fish/nuts/oils/avocado and decreasing the intake of sugars from desserts/sugary drinks, starches with high glycemic index, saturated fat.   Medication Adjustments/Labs and Tests Ordered: Current medicines are reviewed at length with the patient today.  Concerns regarding medicines are outlined above.  Medication changes, Labs and Tests ordered today are listed in the Patient Instructions below. Patient Instructions  Medication Instructions:  DECREASE the Rosuvastatin to 10 mg once daily START CoQ10 300 mg once daily. This is over the counter  *If you need a refill on your cardiac medications before your next appointment, please call your pharmacy*   Lab Work: Your provider would like for you to return in 3 months to have the following labs drawn: fasting lipid. You do not need an appointment for the lab. Once in our office lobby there is a podium where you can sign in and ring the doorbell to alert [place] that you are here. The lab is open from 8:00 am to 4:30 pm; closed for lunch from 12:45pm-1:45pm.  If you have labs (blood work) drawn today and your tests are completely normal, you will receive your results only by: MyChart  Message (if you have MyChart) OR A paper copy in the mail If you have any lab test that is abnormal or we need to change your treatment, we will call you to review the results.   Testing/Procedures: None ordered   Follow-Up: At [facility], you and your health needs are our priority.  As part of our continuing mission to provide you with exceptional heart care, we have created designated Provider Care Teams.  These Care Teams include your primary Cardiologist (physician) and Advanced Practice Providers (APPs -  Physician Assistants and Nurse Practitioners) who all work together to provide you with the care you need, when you need it.  We recommend signing up for the patient portal called "MyChart".  Sign up information is provided on this After Visit Summary.  MyChart is used to connect with patients for Virtual Visits (Telemedicine).  Patients are able to view lab/test results, encounter notes, upcoming appointments, etc.  Non-urgent messages can be sent to your provider as well.   To learn more about what you can do with MyChart, go to [email or web address].    Your next appointment:   12 month(s)  The format for your next appointment:   In Person  Provider:   Thurmon Fair, MD      Signed, Thurmon Fair, MD  05/31/2021 6:49 PM    [facility] [address] [place], [place], [place]  [address] Phone: [phone]; Fax: [phone]

## 2021-05-31 ENCOUNTER — Encounter: Payer: Self-pay | Admitting: Cardiovascular Disease

## 2021-06-01 DIAGNOSIS — Z6831 Body mass index (BMI) 31.0-31.9, adult: Secondary | ICD-10-CM | POA: Insufficient documentation

## 2021-06-01 DIAGNOSIS — Z6834 Body mass index (BMI) 34.0-34.9, adult: Secondary | ICD-10-CM | POA: Insufficient documentation

## 2021-06-01 DIAGNOSIS — I2510 Atherosclerotic heart disease of native coronary artery without angina pectoris: Secondary | ICD-10-CM | POA: Insufficient documentation

## 2021-06-01 DIAGNOSIS — I2583 Coronary atherosclerosis due to lipid rich plaque: Secondary | ICD-10-CM | POA: Insufficient documentation

## 2021-06-01 NOTE — Patient Instructions (Signed)
Fat and Cholesterol Restricted Eating Plan Getting too much fat and cholesterol in your diet may cause health problems. Choosing the right foods helps keep your fat and cholesterol at normal levels. This can keep you from getting certain diseases. Your doctor may recommend an eating plan that includes: Total fat: ______% or less of total calories a day. This is ______g of fat a day. Saturated fat: ______% or less of total calories a day. This is ______g of saturated fat a day. Cholesterol: less than _________mg a day. Fiber: ______g a day. What are tips for following this plan? General tips Work with your doctor to lose weight if you need to. Avoid: Foods with added sugar. Fried foods. Foods with trans fat or partially hydrogenated oils. This includes some margarines and baked goods. If you drink alcohol: Limit how much you have to: 0-1 drink a day for women who are not pregnant. 0-2 drinks a day for men. Know how much alcohol is in a drink. In the [place] one drink equals one 12 oz bottle of beer (355 mL), one 5 oz glass of wine (148 mL), or one 1 oz glass of hard liquor (44 mL). Reading food labels Check food labels for: Trans fats. Partially hydrogenated oils. Saturated fat (g) in each serving. Cholesterol (mg) in each serving. Fiber (g) in each serving. Choose foods with healthy fats, such as: Monounsaturated fats and polyunsaturated fats. These include olive and canola oil, flaxseeds, walnuts, almonds, and seeds. Omega-3 fats. These are found in certain fish, flaxseed oil, and ground flaxseeds. Choose grain products that have whole grains. Look for the word "whole" as the first word in the ingredient list. Cooking Cook foods using low-fat methods. These include baking, boiling, grilling, and broiling. Eat more home-cooked foods. Eat at restaurants and buffets less often. Eat less fast food. Avoid cooking using saturated fats, such as butter, cream, palm oil, palm kernel oil, and  coconut oil. Meal planning  At meals, divide your plate into four equal parts: Fill one-half of your plate with vegetables, green salads, and fruit. Fill one-fourth of your plate with whole grains. Fill one-fourth of your plate with low-fat (lean) protein foods. Eat fish that is high in omega-3 fats at least two times a week. This includes mackerel, tuna, sardines, and salmon. Eat foods that are high in fiber, such as whole grains, beans, apples, pears, berries, broccoli, carrots, peas, and barley. What foods should I eat? Fruits All fresh, canned (in natural juice), or frozen fruits. Vegetables Fresh or frozen vegetables (raw, steamed, roasted, or grilled). Green salads. Grains Whole grains, such as whole wheat or whole grain breads, crackers, cereals, and pasta. Unsweetened oatmeal, bulgur, barley, quinoa, or brown rice. Corn or whole wheat flour tortillas. Meats and other protein foods Ground beef (85% or leaner), grass-fed beef, or beef trimmed of fat. Skinless chicken or turkey. Ground chicken or turkey. Pork trimmed of fat. All fish and seafood. Egg whites. Dried beans, peas, or lentils. Unsalted nuts or seeds. Unsalted canned beans. Nut butters without added sugar or oil. Dairy Low-fat or nonfat dairy products, such as skim or 1% milk, 2% or reduced-fat cheeses, low-fat and fat-free ricotta or cottage cheese, or plain low-fat and nonfat yogurt. Fats and oils Tub margarine without trans fats. Light or reduced-fat mayonnaise and salad dressings. Avocado. Olive, canola, sesame, or safflower oils. The items listed above may not be a complete list of foods and beverages you can eat. Contact a dietitian for more information. What foods   should I avoid? Fruits Canned fruit in heavy syrup. Fruit in cream or butter sauce. Fried fruit. Vegetables Vegetables cooked in cheese, cream, or butter sauce. Fried vegetables. Grains White bread. White pasta. White rice. Cornbread. Bagels, pastries,  and croissants. Crackers and snack foods that contain trans fat and hydrogenated oils. Meats and other protein foods Fatty cuts of meat. Ribs, chicken wings, bacon, sausage, bologna, salami, chitterlings, fatback, hot dogs, bratwurst, and packaged lunch meats. Liver and organ meats. Whole eggs and egg yolks. Chicken and turkey with skin. Fried meat. Dairy Whole or 2% milk, cream, half-and-half, and cream cheese. Whole milk cheeses. Whole-fat or sweetened yogurt. Full-fat cheeses. Nondairy creamers and whipped toppings. Processed cheese, cheese spreads, and cheese curds. Fats and oils Butter, stick margarine, lard, shortening, ghee, or bacon fat. Coconut, palm kernel, and palm oils. Beverages Alcohol. Sugar-sweetened drinks such as sodas, lemonade, and fruit drinks. Sweets and desserts Corn syrup, sugars, honey, and molasses. Candy. Jam and jelly. Syrup. Sweetened cereals. Cookies, pies, cakes, donuts, muffins, and ice cream. The items listed above may not be a complete list of foods and beverages you should avoid. Contact a dietitian for more information. Summary Choosing the right foods helps keep your fat and cholesterol at normal levels. This can keep you from getting certain diseases. At meals, fill one-half of your plate with vegetables, green salads, and fruits. Eat high fiber foods, like whole grains, beans, apples, pears, berries, carrots, peas, and barley. Limit added sugar, saturated fats, alcohol, and fried foods. This information is not intended to replace advice given to you by your health care provider. Make sure you discuss any questions you have with your health care provider. Document Revised: 10/18/2020 Document Reviewed: 10/18/2020 Elsevier Patient Education  2022 [employer] Inc.  

## 2021-06-06 NOTE — Progress Notes (Signed)
Vitamin d deficiency and elevated cholesterol. Will discuss labs at visit 06/27/2021.

## 2021-06-21 ENCOUNTER — Encounter: Payer: Self-pay | Admitting: Nurse Practitioner

## 2021-06-27 ENCOUNTER — Other Ambulatory Visit: Payer: Self-pay

## 2021-06-27 ENCOUNTER — Other Ambulatory Visit: Payer: Self-pay | Admitting: Nurse Practitioner

## 2021-06-27 ENCOUNTER — Ambulatory Visit (INDEPENDENT_AMBULATORY_CARE_PROVIDER_SITE_OTHER): Payer: Commercial Managed Care - PPO | Admitting: Nurse Practitioner

## 2021-06-27 ENCOUNTER — Encounter: Payer: Self-pay | Admitting: Nurse Practitioner

## 2021-06-27 VITALS — BP 138/68 | HR 67 | Temp 97.8°F | Ht 68.0 in | Wt 228.9 lb

## 2021-06-27 DIAGNOSIS — Z0001 Encounter for general adult medical examination with abnormal findings: Secondary | ICD-10-CM

## 2021-06-27 DIAGNOSIS — E7800 Pure hypercholesterolemia, unspecified: Secondary | ICD-10-CM | POA: Diagnosis not present

## 2021-06-27 DIAGNOSIS — E559 Vitamin D deficiency, unspecified: Secondary | ICD-10-CM | POA: Diagnosis not present

## 2021-06-27 DIAGNOSIS — R21 Rash and other nonspecific skin eruption: Secondary | ICD-10-CM

## 2021-06-27 DIAGNOSIS — I214 Non-ST elevation (NSTEMI) myocardial infarction: Secondary | ICD-10-CM

## 2021-06-27 DIAGNOSIS — Z6834 Body mass index (BMI) 34.0-34.9, adult: Secondary | ICD-10-CM

## 2021-06-27 MED ORDER — TRIAMCINOLONE ACETONIDE 0.1 % EX CREA: 1.0000 "application " | TOPICAL_CREAM | Freq: Two times a day (BID) | CUTANEOUS | 0 refills | Status: DC

## 2021-06-27 MED ORDER — METHYLPREDNISOLONE 4 MG PO TBPK: ORAL_TABLET | ORAL | 0 refills | Status: DC

## 2021-06-27 MED ORDER — ERGOCALCIFEROL 1.25 MG (50000 UT) PO CAPS: 50000.0000 [IU] | ORAL_CAPSULE | ORAL | 5 refills | Status: DC

## 2021-06-27 NOTE — Progress Notes (Signed)
Established Patient Office Visit  Subjective:  Patient ID: Dennis Winters, male    DOB: June 25, 1960  Age: 61 y.o. MRN: [id number]  CC:  Chief Complaint  Patient presents with   Annual Exam    HPI Tad Fancher presents for annual wellness visit. He states that over the last week, he has developed rash after starting rosuvastatin and CoQ10. He did talk to his cardiologist about this. Was advised to stop CoQ10 first. When rash was persistent, he stopped taking rosuvastatin as well. Today, he states that rash and itching has continued. I have already sent in a medrol taper for 6 days and a prescription for triamcinolone cream.  The patient does see cardiology. Blood pressure is mildly elevated. He states that it is not normally elevated, he has been taking cold medication due to nasal and sinus congestion. He states that he is feeling some better, but he does have moderate nasal congestion. The patient did have fasting labs done prior to this visit. Cholesterol was moderately elevated. Cardiology did stat trial of rosuvastatin 55m, which has already been stopped due to negative side  effects and rash.   Lipid Panel      Component Value Date/Time   CHOL 211 (H) 05/27/2021 0812   TRIG 183 (H) 05/27/2021 0812   HDL 41 05/27/2021 0812   CHOLHDL 5.1 (H) 05/27/2021 0812   CHOLHDL 5.6 11/21/2017 0454   VLDL UNABLE TO CALCULATE IF TRIGLYCERIDE OVER 400 mg/dL 11/21/2017 0454   LDLCALC 137 (H) 05/27/2021 0812   LABVLDL 33 05/27/2021 0812    After the patient completes medrol taper and triamcinolone cream therapy, he will restart the rosuvastatin 119mevery evening. He will notify cardiology of how he does.  His Vitamin d level was low. His blood sugar was 103 with HgbA1c at 5.6. his other labs were within normal limits.  The patient does see dermatology as he does have multiple drug and environmental sensitivities which cause his skin to break out in various rashes. He plans to have  appointment with her next week.  He has no other concerns r complaints today. He denies chest pain, chest pressure, or shortness of breath. He denies headaches or visual disturbances. He denies abdominal pain, nausea, vomiting, or changes in bowel or bladder habits.    Past Medical History:  Diagnosis Date   Coronary artery disease    High cholesterol    Hypertension     Past Surgical History:  Procedure Laterality Date   ANKLE SURGERY     CARDIAC CATHETERIZATION N/A 05/28/2016   Procedure: Left Heart Cath and Coronary Angiography;  Surgeon: ChBurnell BlanksMD;  [facility];  Service: Cardiovascular;  Laterality: N/A;   CHOLECYSTECTOMY N/A 04/13/2013   Procedure: LAPAROSCOPIC CHOLECYSTECTOMY , attempted IOC.;  Surgeon: HaAdin HectorMD;  Location: [facility];  Service: General;  Laterality: N/A;   CORONARY ARTERY BYPASS GRAFT     CORONARY ARTERY BYPASS GRAFT N/A 05/29/2016   Procedure: CORONARY ARTERY BYPASS GRAFTING (CABG) x 3 with bilateral internal mammary arteries and left radial artery grafting LIMA-LAD FREE RIMA-RCA LEFT RADIAL-OM;  Surgeon: BrGaye PollackMD;  [facility] Service: Open Heart Surgery;  Laterality: N/A;   RADIAL ARTERY HARVEST Left 05/29/2016   Procedure: RADIAL ARTERY HARVEST;  Surgeon: BrGaye PollackMD;  [facility]: [facility];  Laterality: Left;   TEE WITHOUT CARDIOVERSION N/A 05/29/2016   Procedure: TRANSESOPHAGEAL ECHOCARDIOGRAM (TEE);  Surgeon: BrFernande Boyden  Cyndia Bent, MD;  Location: [place] [facility];  Service: Open Heart Surgery;  Laterality: N/A;    Family History  Problem Relation Age of Onset   High Cholesterol Father    High blood pressure Father    Pulmonary disease Maternal Aunt    CAD Neg Hx     Social History   Socioeconomic History   Marital status: Divorced    Spouse name: Not on file   Number of children: Not on file   Years of education: Not on file   Highest education level: Not on file  Occupational  History   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    Social Determinants of Health   Financial Resource Strain: Not on file  Food Insecurity: Not on file  Transportation Needs: Not on file  Physical Activity: Not on file  Stress: Not on file  Social Connections: Not on file  Intimate Partner Violence: Not on file    Outpatient Medications Prior to Visit  Medication Sig Dispense Refill   rosuvastatin (CRESTOR) 10 MG tablet Take 1 tablet (10 mg total) by mouth daily. 90 tablet 3   aspirin EC 81 MG tablet Take 1 tablet (81 mg total) by mouth daily. (Patient not taking: Reported on 06/27/2021) 90 tablet 3   Cholecalciferol 125 MCG (5000 UT) capsule Take 5,000 Units by mouth daily. (Patient not taking: Reported on 06/27/2021)     Coenzyme Q10 (COQ10) 100 MG CAPS Take 300 mg by mouth daily. (Patient not taking: Reported on 06/27/2021) 30 capsule    methylPREDNISolone (MEDROL) 4 MG TBPK tablet Take by mouth as directed for 6 days (Patient not taking: Reported on 06/27/2021) 21 tablet 0   Multiple Vitamin (MULTIVITAMIN WITH MINERALS) TABS tablet Take 1 tablet by mouth daily. (Patient not taking: Reported on 05/30/2021)     pantoprazole (PROTONIX) 40 MG tablet Take 1 tablet (40 mg total) by mouth daily. (Patient not taking: Reported on 05/30/2021) 30 tablet 3   triamcinolone cream (KENALOG) 0.1 % Apply 1 application topically 2 (two) times daily. (Patient not taking: Reported on 06/27/2021) 30 g 0   No facility-administered medications prior to visit.     Allergies  Allergen Reactions   Lipitor [Atorvastatin] Other (See Comments)    Myalgia    Penicillins Rash    Has patient had a PCN reaction causing immediate rash, facial/tongue/throat swelling, SOB or lightheadedness with hypotension: Yes Has patient had a PCN reaction causing severe rash involving mucus membranes or skin necrosis: Yes Has patient had a PCN reaction that required hospitalization No Has patient had a PCN reaction occurring within the last 10 years: Non  If all of the above answers are "NO", then may proceed with Cephalosporin use.    Zocor [Simvastatin] Other (See Comments)    Myalgias    ROS Review of Systems  Constitutional:  Negative for activity change, chills, fatigue and fever.  HENT:  Negative for  congestion, postnasal drip, rhinorrhea, sinus pressure, sinus pain, sneezing and sore throat.   Eyes: Negative.   Respiratory:  Negative for cough, shortness of breath and wheezing.   Cardiovascular:  Negative for chest pain and palpitations.  Gastrointestinal:  Negative for constipation, diarrhea, nausea and vomiting.  Endocrine: Negative for cold intolerance, heat intolerance, polydipsia and polyuria.  Genitourinary:  Negative for dysuria, frequency and urgency.  Musculoskeletal:  Negative for back pain and myalgias.  Skin:  Positive for rash.  Allergic/Immunologic: Negative for environmental allergies.  Neurological:  Negative for dizziness, weakness and headaches.  Psychiatric/Behavioral:  The patient is not nervous/anxious.      Objective:    Physical Exam Vitals and nursing note reviewed.  Constitutional:      Appearance: Normal appearance. He is well-developed.  HENT:     Head: Normocephalic and atraumatic.     Right Ear: Tympanic membrane, ear canal and external ear normal.     Left Ear: Tympanic membrane, ear canal and external ear normal.     Nose: Nose normal.     Mouth/Throat:     Mouth: Mucous membranes are moist.     Pharynx:  Oropharynx is clear.  Eyes:     Extraocular Movements: Extraocular movements intact.     Conjunctiva/sclera: Conjunctivae normal.     Pupils: Pupils are equal, round, and reactive to light.  Neck:     Vascular: No carotid bruit.  Cardiovascular:     Rate and Rhythm: Normal rate and regular rhythm.     Pulses: Normal pulses.     Heart sounds: Normal heart sounds.  Pulmonary:     Effort: Pulmonary effort is normal.     Breath sounds: Normal breath sounds.  Abdominal:     General: Bowel sounds are normal. There is no distension.     Palpations: Abdomen is soft. There is no mass.     Tenderness: There is no abdominal tenderness. There is no guarding or rebound.     Hernia: No hernia is present.  Musculoskeletal:        General: Normal range of motion.     Cervical back: Normal range of motion and neck supple.  Lymphadenopathy:     Cervical: No cervical adenopathy.  Skin:    General: Skin is warm and dry.     Capillary Refill: Capillary refill takes less than 2 seconds.     Findings: Rash present.     Comments: Finely raised, red, rash, present on the back.   Neurological:     General: No focal deficit present.     Mental Status: He is alert and oriented to person, place, and time.  Psychiatric:        Mood and Affect: Mood normal.        Behavior: Behavior normal.        Thought Content: Thought content normal.        Judgment: Judgment normal.    Today's Vitals   06/27/21 0834 06/27/21 0917  BP: (!) 157/66 138/68  Pulse: 67   Temp: 97.8 F (36.6 C)   SpO2: 98%   Weight: 228 lb 14.4 oz (103.8 kg)   Height: _0  (1.727 m)    Body mass index is 34.8 kg/m.   Wt Readings from Last 3 Encounters:  06/27/21 228 lb 14.4 oz (103.8 kg)  05/30/21 218 lb 3.2 oz (99 kg)  05/23/21 227 lb 11.2 oz (103.3 kg)     Health Maintenance Due  Topic Date Due  COVID-19 Vaccine (1) Never done    There are no preventive care reminders to display for this patient.  Lab Results   Component Value Date   TSH 1.400 05/27/2021   Lab Results  Component Value Date   WBC 5.0 05/27/2021   HGB 17.4 05/27/2021   HCT 51.7 (H) 05/27/2021   MCV 92 05/27/2021   PLT 277 05/27/2021   Lab Results  Component Value Date   NA 143 05/27/2021   K 4.3 05/27/2021   CO2 21 05/27/2021   GLUCOSE 103 (H) 05/27/2021   BUN 9 05/27/2021   CREATININE 0.77 05/27/2021   BILITOT 0.3 05/27/2021   ALKPHOS 83 05/27/2021   AST 19 05/27/2021   ALT 21 05/27/2021   PROT 6.5 05/27/2021   ALBUMIN 4.1 05/27/2021   CALCIUM 9.0 05/27/2021   ANIONGAP 12 11/21/2017   EGFR 103 05/27/2021   Lab Results  Component Value Date   CHOL 211 (H) 05/27/2021   Lab Results  Component Value Date   HDL 41 05/27/2021   Lab Results  Component Value Date   LDLCALC 137 (H) 05/27/2021   Lab Results  Component Value Date   TRIG 183 (H) 05/27/2021   Lab Results  Component Value Date   CHOLHDL 5.1 (H) 05/27/2021   Lab Results  Component Value Date   HGBA1C 5.6 05/27/2021      Assessment & Plan:  1. Encounter for general adult medical examination with abnormal findings Annual wellness visit today.   2. High cholesterol Cholesterol panel elevated, though patient has had negative reaction to rosuvastatin or vitamin CoQ10.  Patient currently on Medrol taper and has stopped both rosuvastatin and co-Q10.  Patient to restart rosuvastatin 10 mg only after completion of Medrol taper.  Patient's cardiologist is aware of difficulties with statin dosing.  3. NSTEMI (non-ST elevated myocardial infarction) [facility]) Patient is stable.  Currently sees cardiologist on routine basis.  4. Vitamin D deficiency Drisdol 50,000 international units may be taken once weekly for next 6 months. - ergocalciferol (DRISDOL) 1.25 MG (50000 UT) capsule; Take 1 capsule (50,000 Units total) by mouth once a week.  Dispense: 4 capsule; Refill: 5  5. Body mass index (BMI) of 34.0-34.9 in adult Encourage patient to limit calorie  intake to 2000 cal/day or less.  He should consume a low cholesterol, low-fat diet.  Patient should incorporate exercise into his daily routine.     Problem List Items Addressed This Visit       Cardiovascular and Mediastinum   NSTEMI (non-ST elevated myocardial infarction) ([place])     Other   High cholesterol   Body mass index (BMI) of 34.0-34.9 in adult   Vitamin D deficiency   Relevant Medications   ergocalciferol (DRISDOL) 1.25 MG (50000 UT) capsule   Other Visit Diagnoses     Encounter for general adult medical examination with abnormal findings    -  Primary       Meds ordered this encounter  Medications   ergocalciferol (DRISDOL) 1.25 MG (50000 UT) capsule    Sig: Take 1 capsule (50,000 Units total) by mouth once a week.    Dispense:  4 capsule    Refill:  5    Order Specific Question:   Supervising Provider    Answer:   Beatrice Lecher D [[id number]]    Follow-up: Return in about 1 year (around 06/27/2022) for health maintenance exam, FBW a week prior to visit.    Ronnell Freshwater, NP  This note was  dictated using Systems analyst. Rapid proofreading was performed to expedite the delivery of the information. Despite proofreading, phonetic errors will occur which are common with this voice recognition software. Please take this into consideration. If there are any concerns, please contact our office.

## 2021-06-27 NOTE — Progress Notes (Signed)
Due to rash which has been persistent, started a medtol taper. Take as directed for 6 days. May also use triamcinolone cream on effected areas twice daily as needed. Both prescriptions were sent to [facility]

## 2021-06-27 NOTE — Patient Instructions (Addendum)
Fat and Cholesterol Restricted Eating Plan Getting too much fat and cholesterol in your diet may cause health problems. Choosing the right foods helps keep your fat and cholesterol at normal levels. This can keep you from getting certain diseases. Your doctor may recommend an eating plan that includes: Total fat: ______% or less of total calories a day. This is ______g of fat a day. Saturated fat: ______% or less of total calories a day. This is ______g of saturated fat a day. Cholesterol: less than _________mg a day. Fiber: ______g a day. What are tips for following this plan? General tips Work with your doctor to lose weight if you need to. Avoid: Foods with added sugar. Fried foods. Foods with trans fat or partially hydrogenated oils. This includes some margarines and baked goods. If you drink alcohol: Limit how much you have to: 0-1 drink a day for women who are not pregnant. 0-2 drinks a day for men. Know how much alcohol is in a drink. In the [place] one drink equals one 12 oz bottle of beer (355 mL), one 5 oz glass of wine (148 mL), or one 1 oz glass of hard liquor (44 mL). Reading food labels Check food labels for: Trans fats. Partially hydrogenated oils. Saturated fat (g) in each serving. Cholesterol (mg) in each serving. Fiber (g) in each serving. Choose foods with healthy fats, such as: Monounsaturated fats and polyunsaturated fats. These include olive and canola oil, flaxseeds, walnuts, almonds, and seeds. Omega-3 fats. These are found in certain fish, flaxseed oil, and ground flaxseeds. Choose grain products that have whole grains. Look for the word "whole" as the first word in the ingredient list. Cooking Cook foods using low-fat methods. These include baking, boiling, grilling, and broiling. Eat more home-cooked foods. Eat at restaurants and buffets less often. Eat less fast food. Avoid cooking using saturated fats, such as butter, cream, palm oil, palm kernel oil, and  coconut oil. Meal planning  At meals, divide your plate into four equal parts: Fill one-half of your plate with vegetables, green salads, and fruit. Fill one-fourth of your plate with whole grains. Fill one-fourth of your plate with low-fat (lean) protein foods. Eat fish that is high in omega-3 fats at least two times a week. This includes mackerel, tuna, sardines, and salmon. Eat foods that are high in fiber, such as whole grains, beans, apples, pears, berries, broccoli, carrots, peas, and barley. What foods should I eat? Fruits All fresh, canned (in natural juice), or frozen fruits. Vegetables Fresh or frozen vegetables (raw, steamed, roasted, or grilled). Green salads. Grains Whole grains, such as whole wheat or whole grain breads, crackers, cereals, and pasta. Unsweetened oatmeal, bulgur, barley, quinoa, or brown rice. Corn or whole wheat flour tortillas. Meats and other protein foods Ground beef (85% or leaner), grass-fed beef, or beef trimmed of fat. Skinless chicken or turkey. Ground chicken or turkey. Pork trimmed of fat. All fish and seafood. Egg whites. Dried beans, peas, or lentils. Unsalted nuts or seeds. Unsalted canned beans. Nut butters without added sugar or oil. Dairy Low-fat or nonfat dairy products, such as skim or 1% milk, 2% or reduced-fat cheeses, low-fat and fat-free ricotta or cottage cheese, or plain low-fat and nonfat yogurt. Fats and oils Tub margarine without trans fats. Light or reduced-fat mayonnaise and salad dressings. Avocado. Olive, canola, sesame, or safflower oils. The items listed above may not be a complete list of foods and beverages you can eat. Contact a dietitian for more information. What foods   should I avoid? Fruits Canned fruit in heavy syrup. Fruit in cream or butter sauce. Fried fruit. Vegetables Vegetables cooked in cheese, cream, or butter sauce. Fried vegetables. Grains White bread. White pasta. White rice. Cornbread. Bagels, pastries,  and croissants. Crackers and snack foods that contain trans fat and hydrogenated oils. Meats and other protein foods Fatty cuts of meat. Ribs, chicken wings, bacon, sausage, bologna, salami, chitterlings, fatback, hot dogs, bratwurst, and packaged lunch meats. Liver and organ meats. Whole eggs and egg yolks. Chicken and turkey with skin. Fried meat. Dairy Whole or 2% milk, cream, half-and-half, and cream cheese. Whole milk cheeses. Whole-fat or sweetened yogurt. Full-fat cheeses. Nondairy creamers and whipped toppings. Processed cheese, cheese spreads, and cheese curds. Fats and oils Butter, stick margarine, lard, shortening, ghee, or bacon fat. Coconut, palm kernel, and palm oils. Beverages Alcohol. Sugar-sweetened drinks such as sodas, lemonade, and fruit drinks. Sweets and desserts Corn syrup, sugars, honey, and molasses. Candy. Jam and jelly. Syrup. Sweetened cereals. Cookies, pies, cakes, donuts, muffins, and ice cream. The items listed above may not be a complete list of foods and beverages you should avoid. Contact a dietitian for more information. Summary Choosing the right foods helps keep your fat and cholesterol at normal levels. This can keep you from getting certain diseases. At meals, fill one-half of your plate with vegetables, green salads, and fruits. Eat high fiber foods, like whole grains, beans, apples, pears, berries, carrots, peas, and barley. Limit added sugar, saturated fats, alcohol, and fried foods. This information is not intended to replace advice given to you by your health care provider. Make sure you discuss any questions you have with your health care provider. Document Revised: 10/18/2020 Document Reviewed: 10/18/2020 Elsevier Patient Education  2022 Elsevier Inc.  Fat and Cholesterol Restricted Eating Plan Getting too much fat and cholesterol in your diet may cause health problems. Choosing the right foods helps keep your fat and cholesterol at normal levels.  This can keep you from getting certain diseases. Your doctor may recommend an eating plan that includes: Total fat: ______% or less of total calories a day. This is ______g of fat a day. Saturated fat: ______% or less of total calories a day. This is ______g of saturated fat a day. Cholesterol: less than _________mg a day. Fiber: ______g a day. What are tips for following this plan? General tips Work with your doctor to lose weight if you need to. Avoid: Foods with added sugar. Fried foods. Foods with trans fat or partially hydrogenated oils. This includes some margarines and baked goods. If you drink alcohol: Limit how much you have to: 0-1 drink a day for women who are not pregnant. 0-2 drinks a day for men. Know how much alcohol is in a drink. In the [place] one drink equals one 12 oz bottle of beer (355 mL), one 5 oz glass of wine (148 mL), or one 1 oz glass of hard liquor (44 mL). Reading food labels Check food labels for: Trans fats. Partially hydrogenated oils. Saturated fat (g) in each serving. Cholesterol (mg) in each serving. Fiber (g) in each serving. Choose foods with healthy fats, such as: Monounsaturated fats and polyunsaturated fats. These include olive and canola oil, flaxseeds, walnuts, almonds, and seeds. Omega-3 fats. These are found in certain fish, flaxseed oil, and ground flaxseeds. Choose grain products that have whole grains. Look for the word "whole" as the first word in the ingredient list. Cooking Cook foods using low-fat methods. These include baking, boiling, grilling,   and broiling. Eat more home-cooked foods. Eat at restaurants and buffets less often. Eat less fast food. Avoid cooking using saturated fats, such as butter, cream, palm oil, palm kernel oil, and coconut oil. Meal planning  At meals, divide your plate into four equal parts: Fill one-half of your plate with vegetables, green salads, and fruit. Fill one-fourth of your plate with whole  grains. Fill one-fourth of your plate with low-fat (lean) protein foods. Eat fish that is high in omega-3 fats at least two times a week. This includes mackerel, tuna, sardines, and salmon. Eat foods that are high in fiber, such as whole grains, beans, apples, pears, berries, broccoli, carrots, peas, and barley. What foods should I eat? Fruits All fresh, canned (in natural juice), or frozen fruits. Vegetables Fresh or frozen vegetables (raw, steamed, roasted, or grilled). Green salads. Grains Whole grains, such as whole wheat or whole grain breads, crackers, cereals, and pasta. Unsweetened oatmeal, bulgur, barley, quinoa, or brown rice. Corn or whole wheat flour tortillas. Meats and other protein foods Ground beef (85% or leaner), grass-fed beef, or beef trimmed of fat. Skinless chicken or turkey. Ground chicken or turkey. Pork trimmed of fat. All fish and seafood. Egg whites. Dried beans, peas, or lentils. Unsalted nuts or seeds. Unsalted canned beans. Nut butters without added sugar or oil. Dairy Low-fat or nonfat dairy products, such as skim or 1% milk, 2% or reduced-fat cheeses, low-fat and fat-free ricotta or cottage cheese, or plain low-fat and nonfat yogurt. Fats and oils Tub margarine without trans fats. Light or reduced-fat mayonnaise and salad dressings. Avocado. Olive, canola, sesame, or safflower oils. The items listed above may not be a complete list of foods and beverages you can eat. Contact a dietitian for more information. What foods should I avoid? Fruits Canned fruit in heavy syrup. Fruit in cream or butter sauce. Fried fruit. Vegetables Vegetables cooked in cheese, cream, or butter sauce. Fried vegetables. Grains White bread. White pasta. White rice. Cornbread. Bagels, pastries, and croissants. Crackers and snack foods that contain trans fat and hydrogenated oils. Meats and other protein foods Fatty cuts of meat. Ribs, chicken wings, bacon, sausage, bologna, salami,  chitterlings, fatback, hot dogs, bratwurst, and packaged lunch meats. Liver and organ meats. Whole eggs and egg yolks. Chicken and turkey with skin. Fried meat. Dairy Whole or 2% milk, cream, half-and-half, and cream cheese. Whole milk cheeses. Whole-fat or sweetened yogurt. Full-fat cheeses. Nondairy creamers and whipped toppings. Processed cheese, cheese spreads, and cheese curds. Fats and oils Butter, stick margarine, lard, shortening, ghee, or bacon fat. Coconut, palm kernel, and palm oils. Beverages Alcohol. Sugar-sweetened drinks such as sodas, lemonade, and fruit drinks. Sweets and desserts Corn syrup, sugars, honey, and molasses. Candy. Jam and jelly. Syrup. Sweetened cereals. Cookies, pies, cakes, donuts, muffins, and ice cream. The items listed above may not be a complete list of foods and beverages you should avoid. Contact a dietitian for more information. Summary Choosing the right foods helps keep your fat and cholesterol at normal levels. This can keep you from getting certain diseases. At meals, fill one-half of your plate with vegetables, green salads, and fruits. Eat high fiber foods, like whole grains, beans, apples, pears, berries, carrots, peas, and barley. Limit added sugar, saturated fats, alcohol, and fried foods. This information is not intended to replace advice given to you by your health care provider. Make sure you discuss any questions you have with your health care provider. Document Revised: 10/18/2020 Document Reviewed: 10/18/2020 Elsevier Patient Education    2022 Elsevier Inc.  

## 2021-06-29 DIAGNOSIS — E559 Vitamin D deficiency, unspecified: Secondary | ICD-10-CM | POA: Insufficient documentation

## 2021-10-24 ENCOUNTER — Ambulatory Visit (INDEPENDENT_AMBULATORY_CARE_PROVIDER_SITE_OTHER): Payer: Commercial Managed Care - PPO | Admitting: Nurse Practitioner

## 2021-10-24 ENCOUNTER — Encounter: Payer: Self-pay | Admitting: Nurse Practitioner

## 2021-10-24 VITALS — BP 130/80 | HR 62 | Temp 97.8°F | Ht 68.0 in | Wt 213.8 lb

## 2021-10-24 DIAGNOSIS — M791 Myalgia, unspecified site: Secondary | ICD-10-CM

## 2021-10-24 DIAGNOSIS — J0140 Acute pansinusitis, unspecified: Secondary | ICD-10-CM

## 2021-10-24 MED ORDER — IBUPROFEN 800 MG PO TABS: 800.0000 mg | ORAL_TABLET | Freq: Three times a day (TID) | ORAL | 0 refills | Status: DC | PRN

## 2021-10-24 MED ORDER — PREDNISONE 10 MG (21) PO TBPK: ORAL_TABLET | ORAL | 0 refills | Status: DC

## 2021-10-24 MED ORDER — AZITHROMYCIN 250 MG PO TABS: ORAL_TABLET | ORAL | 0 refills | Status: DC

## 2021-10-24 NOTE — Progress Notes (Signed)
Established patient visit ? ? ?Patient: Dennis Winters   DOB: 07/22/1960   61 y.o. Male  MRN: [id number] ?Visit Date: 10/24/2021 ? ?Chief Complaint  ?Patient presents with  ? Cough  ? ?Subjective  ?  ?States that symptoms started on Monday and Tuesday. Felt like he had stomach bug. Had diarrhea and cramping. Has developed into muscle aches and pain, nasal congestion, cough. Has headache and severe fatigue.  ? ?Cough ?This is a new problem. The current episode started in the past 7 days. The problem has been waxing and waning. The cough is Productive of sputum. Associated symptoms include chills, headaches, myalgias, nasal congestion, postnasal drip, rhinorrhea, shortness of breath and wheezing. Pertinent negatives include no chest pain, fever, rash or sore throat. Risk factors for lung disease include occupational exposure. Treatments tried: thinnks he took some ibuprofen on monday or tuesday, but had no relief. The treatment provided no relief. There is no history of environmental allergies.   ? ? ?Medications: ?Outpatient Medications Prior to Visit  ?Medication Sig  ? ergocalciferol (DRISDOL) 1.25 MG (50000 UT) capsule Take 1 capsule (50,000 Units total) by mouth once a week.  ? rosuvastatin (CRESTOR) 10 MG tablet Take 1 tablet (10 mg total) by mouth daily.  ? aspirin EC 81 MG tablet Take 1 tablet (81 mg total) by mouth daily. (Patient not taking: Reported on 06/27/2021)  ? Cholecalciferol 125 MCG (5000 UT) capsule Take 5,000 Units by mouth daily. (Patient not taking: Reported on 06/27/2021)  ? Coenzyme Q10 (COQ10) 100 MG CAPS Take 300 mg by mouth daily. (Patient not taking: Reported on 06/27/2021)  ? Multiple Vitamin (MULTIVITAMIN WITH MINERALS) TABS tablet Take 1 tablet by mouth daily. (Patient not taking: Reported on 05/30/2021)  ? pantoprazole (PROTONIX) 40 MG tablet Take 1 tablet (40 mg total) by mouth daily. (Patient not taking: Reported on 05/30/2021)  ? triamcinolone cream (KENALOG) 0.1 % Apply 1 application  topically 2 (two) times daily. (Patient not taking: Reported on 06/27/2021)  ? [DISCONTINUED] methylPREDNISolone (MEDROL) 4 MG TBPK tablet Take by mouth as directed for 6 days (Patient not taking: Reported on 06/27/2021)  ? ?No facility-administered medications prior to visit.  ? ? ?Review of Systems  ?Constitutional:  Positive for chills and fatigue. Negative for activity change and fever.  ?HENT:  Positive for congestion, postnasal drip, rhinorrhea, sinus pressure, sinus pain and sneezing. Negative for sore throat.   ?Eyes: Negative.   ?Respiratory:  Positive for cough, shortness of breath and wheezing.   ?Cardiovascular:  Negative for chest pain and palpitations.  ?Gastrointestinal:  Positive for nausea. Negative for constipation, diarrhea and vomiting.  ?Endocrine: Negative for cold intolerance, heat intolerance, polydipsia and polyuria.  ?Genitourinary:  Negative for dysuria, frequency and urgency.  ?Musculoskeletal:  Positive for myalgias. Negative for back pain.  ?Skin:  Negative for rash.  ?Allergic/Immunologic: Negative for environmental allergies.  ?Neurological:  Positive for headaches. Negative for dizziness and weakness.  ?Hematological:  Positive for adenopathy.  ?Psychiatric/Behavioral:  The patient is not nervous/anxious.   ? ? Objective  ?  ? ?Today's Vitals  ? 10/24/21 1011  ?BP: 130/80  ?Pulse: 62  ?Temp: 97.8 ?F (36.6 ?C)  ?SpO2: 98%  ?Weight: 213 lb 12.8 oz (97 kg)  ?Height: 5\' 8"  (1.727 m)  ? ?Body mass index is 32.51 kg/m?.  ? ?Physical Exam ?Vitals and nursing note reviewed.  ?Constitutional:   ?   Appearance: Normal appearance. He is well-developed. He is ill-appearing.  ?HENT:  ?   Head: Normocephalic  and atraumatic.  ?   Right Ear: Tympanic membrane is erythematous and bulging.  ?   Left Ear: Tympanic membrane is erythematous and bulging.  ?   Nose: Congestion present.  ?   Right Sinus: Maxillary sinus tenderness and frontal sinus tenderness present.  ?   Left Sinus: Maxillary sinus tenderness  and frontal sinus tenderness present.  ?   Mouth/Throat:  ?   Pharynx: Posterior oropharyngeal erythema present.  ?Eyes:  ?   Pupils: Pupils are equal, round, and reactive to light.  ?Cardiovascular:  ?   Rate and Rhythm: Normal rate and regular rhythm.  ?   Pulses: Normal pulses.  ?   Heart sounds: Normal heart sounds.  ?Pulmonary:  ?   Effort: Pulmonary effort is normal.  ?   Breath sounds: Normal breath sounds.  ?Abdominal:  ?   Palpations: Abdomen is soft.  ?Musculoskeletal:     ?   General: Normal range of motion.  ?   Cervical back: Normal range of motion and neck supple.  ?Lymphadenopathy:  ?   Cervical: Cervical adenopathy present.  ?Skin: ?   General: Skin is warm and dry.  ?   Capillary Refill: Capillary refill takes less than 2 seconds.  ?Neurological:  ?   General: No focal deficit present.  ?   Mental Status: He is alert and oriented to person, place, and time.  ?Psychiatric:     ?   Mood and Affect: Mood normal.     ?   Behavior: Behavior normal.     ?   Thought Content: Thought content normal.     ?   Judgment: Judgment normal.  ?  ? ? ?Results for orders placed or performed in visit on 10/24/21  ?Novel Coronavirus, NAA ([facility])  ? Specimen: Nasopharyngeal(NP) swabs in vial transport medium  ? Nasopharynge  Previous  ?Result Value Ref Range  ? SARS-CoV-2, NAA Not Detected Not Detected  ? ? Assessment & Plan  ?  ? ?1. Acute non-recurrent pansinusitis ?Send testing for COVID-19.  Treat infection with Z-Pak.  Take as directed for 5 days.  Add prednisone taper.  Take as directed for 6 days. Rest and increase fluids. Continue using OTC medication to control symptoms.   ?- azithromycin (ZITHROMAX) 250 MG tablet; z-pack - take as directed for 5 days  Dispense: 6 tablet; Refill: 0 ?- predniSONE (STERAPRED UNI-PAK 21 TAB) 10 MG (21) TBPK tablet; 6 day taper - take by mouth as directed for 6 days  Dispense: 21 tablet; Refill: 0 ?- Novel Coronavirus, NAA ([facility]); Future ?- Novel Coronavirus, NAA  ([facility]) ? ?2. Myalgia ?May take ibuprofen 800 mg up to 3 times daily as needed for pain and/or fever. ?- ibuprofen (ADVIL) 800 MG tablet; Take 1 tablet (800 mg total) by mouth every 8 (eight) hours as needed.  Dispense: 45 tablet; Refill: 0  ? ?Return for prn worsening or persistent symptoms.  ?   ? ? ? ?Carlean Jews, NP  ?[facility] ?[phone] (phone) ?[phone] (fax) ? ?[facility] ?

## 2021-10-26 LAB — NOVEL CORONAVIRUS, NAA: SARS-CoV-2, NAA: NOT DETECTED

## 2021-10-27 ENCOUNTER — Encounter: Payer: Self-pay | Admitting: Nurse Practitioner

## 2021-10-28 ENCOUNTER — Encounter: Payer: Self-pay | Admitting: Nurse Practitioner

## 2021-11-02 DIAGNOSIS — J0140 Acute pansinusitis, unspecified: Secondary | ICD-10-CM | POA: Insufficient documentation

## 2021-11-02 DIAGNOSIS — M791 Myalgia, unspecified site: Secondary | ICD-10-CM | POA: Insufficient documentation

## 2022-08-13 NOTE — Progress Notes (Signed)
Established patient visit   Patient: Dennis Winters   DOB: November 30, 1960   62 y.o. Male  MRN: [id number] Visit Date: 08/14/2022   Chief Complaint  Patient presents with   Ankle Pain   Hand Pain   Subjective    HPI  Follow up  -hyperlipidemia  --due for colon cancer screening.   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 Will bring copy of results to review and update his chart.   Left ankle pain.  -walks long periods of time  -gets painful and swells.  --causes knee and left hip pain.  -would like referral to [facility] for further evaluating.   -He denies chest pain, chest pressure, or shortness of breath. He denies headaches or visual disturbances. He denies abdominal pain, nausea, vomiting, or changes in bowel or bladder habits.    Medications: Outpatient Medications Prior to Visit  Medication Sig   [DISCONTINUED] aspirin EC 81 MG tablet Take 1 tablet (81 mg total) by mouth daily. (Patient not taking: Reported on 06/27/2021)   [DISCONTINUED] azithromycin (ZITHROMAX) 250 MG tablet z-pack - take as directed for 5 days   [DISCONTINUED] Cholecalciferol 125 MCG (5000 UT) capsule Take 5,000 Units by mouth daily. (Patient not taking: Reported on 06/27/2021)   [DISCONTINUED] Coenzyme Q10 (COQ10) 100 MG CAPS Take 300 mg by mouth daily. (Patient not taking: Reported on 06/27/2021)   [DISCONTINUED] ergocalciferol (DRISDOL) 1.25 MG (50000 UT) capsule Take 1 capsule (50,000 Units total) by mouth once a week.   [DISCONTINUED] ibuprofen (ADVIL) 800 MG tablet Take 1 tablet (800 mg total) by mouth every 8 (eight) hours as needed.   [DISCONTINUED] Multiple Vitamin (MULTIVITAMIN WITH MINERALS) TABS tablet Take 1 tablet by mouth daily. (Patient not taking: Reported on 05/30/2021)   [DISCONTINUED] pantoprazole (PROTONIX) 40 MG tablet Take 1 tablet (40 mg total) by mouth daily. (Patient  not taking: Reported on 05/30/2021)   [DISCONTINUED] predniSONE (STERAPRED UNI-PAK 21 TAB) 10 MG (21) TBPK tablet 6 day taper - take by mouth as directed for 6 days   [DISCONTINUED] rosuvastatin (CRESTOR) 10 MG tablet Take 1 tablet (10 mg total) by mouth daily. (Patient not taking: Reported on 08/14/2022)   [DISCONTINUED] triamcinolone cream (KENALOG) 0.1 % Apply 1 application topically 2 (two) times daily. (Patient not taking: Reported on 06/27/2021)   No facility-administered medications prior to visit.    Review of Systems See HPI      Objective     Today's Vitals   08/14/22 0826  BP: 132/76  Pulse: 69  SpO2: 97%  Weight: 209 lb 1.9 oz (94.9 kg)  Height: 5\' 8"  (1.727 m)   Body mass index is 31.8 kg/m.  BP Readings from Last 3 Encounters:  08/14/22 132/76  10/24/21 130/80  06/27/21 138/68    Wt Readings from Last 3 Encounters:  08/14/22 209 lb 1.9 oz (94.9 kg)  10/24/21 213 lb 12.8 oz (97 kg)  06/27/21 228 lb 14.4 oz (103.8 kg)    Physical Exam Vitals and nursing note reviewed.  Constitutional:      Appearance: Normal appearance. He is well-developed.  HENT:     Head: Normocephalic and atraumatic.     Nose: Nose normal.     Mouth/Throat:     Mouth: Mucous membranes are moist.     Pharynx: Oropharynx is clear.  Eyes:     Extraocular Movements: Extraocular movements intact.  Conjunctiva/sclera: Conjunctivae normal.     Pupils: Pupils are equal, round, and reactive to light.  Neck:     Vascular: No carotid bruit.  Cardiovascular:     Rate and Rhythm: Normal rate and regular rhythm.     Pulses: Normal pulses.     Heart sounds: Normal heart sounds.  Pulmonary:     Effort: Pulmonary effort is normal.     Breath sounds: Normal breath sounds.  Abdominal:     Palpations: Abdomen is soft.  Musculoskeletal:        General: Normal range of motion.     Cervical back: Normal range of motion and neck supple.  Lymphadenopathy:     Cervical: No cervical adenopathy.   Skin:    General: Skin is warm and dry.     Capillary Refill: Capillary refill takes less than 2 seconds.  Neurological:     General: No focal deficit present.     Mental Status: He is alert and oriented to person, place, and time.  Psychiatric:        Mood and Affect: Mood normal.        Behavior: Behavior normal.        Thought Content: Thought content normal.        Judgment: Judgment normal.      Assessment & Plan    Chronic pain of left ankle Assessment & Plan: Recommend use of OTC tylenol and/or ibuprofen as needed and as indicated. Refer to [place] [facility] for further evaluation and treatment.    Orders: -     Ambulatory referral to Orthopedics  High cholesterol Assessment & Plan: Stable. --generally well controlled    Orders: -     Rosuvastatin Calcium; Take 1 tablet (10 mg total) by mouth daily.  Dispense: 90 tablet; Refill: 3  BMI 31.0-31.9,adult Assessment & Plan: Discussed lowering calorie intake to 1500 calories per day and incorporating exercise into daily routine to help lose weight.    Screening for colon cancer -     Cologuard; Future    Problem List Items Addressed This Visit       Other   High cholesterol    Stable. --generally well controlled        Relevant Medications   rosuvastatin (CRESTOR) 10 MG tablet   BMI 31.0-31.9,adult    Discussed lowering calorie intake to 1500 calories per day and incorporating exercise into daily routine to help lose weight.       Chronic pain of left ankle - Primary    Recommend use of OTC tylenol and/or ibuprofen as needed and as indicated. Refer to [place] for further evaluation and treatment.        Relevant Orders   Ambulatory referral to Orthopedics   Other Visit Diagnoses     Screening for colon cancer       Relevant Orders   Cologuard        Return in about 6 months (around 02/12/2023) for health maintenance exam.         Ronnell Freshwater, NP  [place] [facility] [phone] (phone) [phone] (fax)  [place]

## 2022-08-14 ENCOUNTER — Ambulatory Visit: Payer: BC Managed Care – PPO | Admitting: Nurse Practitioner

## 2022-08-14 ENCOUNTER — Encounter: Payer: Self-pay | Admitting: Nurse Practitioner

## 2022-08-14 VITALS — BP 132/76 | HR 69 | Ht 68.0 in | Wt 209.1 lb

## 2022-08-14 DIAGNOSIS — Z6831 Body mass index (BMI) 31.0-31.9, adult: Secondary | ICD-10-CM | POA: Diagnosis not present

## 2022-08-14 DIAGNOSIS — G8929 Other chronic pain: Secondary | ICD-10-CM

## 2022-08-14 DIAGNOSIS — M25572 Pain in left ankle and joints of left foot: Secondary | ICD-10-CM

## 2022-08-14 DIAGNOSIS — E7800 Pure hypercholesterolemia, unspecified: Secondary | ICD-10-CM

## 2022-08-14 DIAGNOSIS — Z1211 Encounter for screening for malignant neoplasm of colon: Secondary | ICD-10-CM

## 2022-08-14 MED ORDER — ROSUVASTATIN CALCIUM 10 MG PO TABS: 10.0000 mg | ORAL_TABLET | Freq: Every day | ORAL | 3 refills | Status: DC

## 2022-09-13 DIAGNOSIS — G8929 Other chronic pain: Secondary | ICD-10-CM | POA: Insufficient documentation

## 2022-09-13 NOTE — Assessment & Plan Note (Signed)
Recommend use of OTC tylenol and/or ibuprofen as needed and as indicated. Refer to [place] for further evaluation and treatment.

## 2022-09-13 NOTE — Assessment & Plan Note (Signed)
Stable. --generally well controlled

## 2022-09-13 NOTE — Assessment & Plan Note (Signed)
Discussed lowering calorie intake to 1500 calories per day and incorporating exercise into daily routine to help lose weight.  °

## 2022-11-06 ENCOUNTER — Ambulatory Visit: Payer: BC Managed Care – PPO | Admitting: Nurse Practitioner

## 2022-11-13 ENCOUNTER — Telehealth: Payer: Self-pay | Admitting: Emergency Medicine

## 2022-11-13 ENCOUNTER — Encounter: Payer: Self-pay | Admitting: Family Medicine

## 2022-11-13 ENCOUNTER — Ambulatory Visit: Payer: BC Managed Care – PPO | Admitting: Family Medicine

## 2022-11-13 VITALS — BP 134/77 | HR 54 | Resp 18 | Ht 68.0 in | Wt 227.0 lb

## 2022-11-13 DIAGNOSIS — E782 Mixed hyperlipidemia: Secondary | ICD-10-CM

## 2022-11-13 DIAGNOSIS — G479 Sleep disorder, unspecified: Secondary | ICD-10-CM

## 2022-11-13 DIAGNOSIS — Z23 Encounter for immunization: Secondary | ICD-10-CM

## 2022-11-13 DIAGNOSIS — E559 Vitamin D deficiency, unspecified: Secondary | ICD-10-CM

## 2022-11-13 DIAGNOSIS — I10 Essential (primary) hypertension: Secondary | ICD-10-CM

## 2022-11-13 DIAGNOSIS — I2510 Atherosclerotic heart disease of native coronary artery without angina pectoris: Secondary | ICD-10-CM

## 2022-11-13 DIAGNOSIS — Z6831 Body mass index (BMI) 31.0-31.9, adult: Secondary | ICD-10-CM

## 2022-11-13 DIAGNOSIS — R0683 Snoring: Secondary | ICD-10-CM

## 2022-11-13 DIAGNOSIS — E785 Hyperlipidemia, unspecified: Secondary | ICD-10-CM

## 2022-11-13 DIAGNOSIS — G8929 Other chronic pain: Secondary | ICD-10-CM

## 2022-11-13 DIAGNOSIS — M25572 Pain in left ankle and joints of left foot: Secondary | ICD-10-CM

## 2022-11-13 NOTE — Assessment & Plan Note (Signed)
BP goal <130/80.  Essentially at goal, continue follow-up with cardiologist.  Recommended to call cardiology and schedule visit as soon as possible that has been sometime since his last appointment.

## 2022-11-13 NOTE — Telephone Encounter (Signed)
Dennis Winters, Dennis Hora, Dennis Winters  Melida Quitter, [place]; Scheryl Marten, RN We will get him back in. Florentina Addison, can we please make sure he has an updated lipid profile and bmet and then have him see our pharmacy team to start Repatha or Praluent? Also the first available appt (with me or APP) for ACD s/p CABG, please. Thank you   Labs were drawn today 11/13/22 at appt with Saralyn Pilar PA  Referral to pharmacy placed.   Appt made for 11/19/22 at 0850 with NP  Talked to pt's daughter (dpr) informed of appt with an NP and referral to pharmacy. She verbalized understanding and said she will inform her father. Will also send a MyChart message as well

## 2022-11-13 NOTE — Assessment & Plan Note (Signed)
Discontinue rosuvastatin completely.  Recommend getting in touch with cardiology, per their last note they recommended discussing Repatha or Praluent.

## 2022-11-13 NOTE — Patient Instructions (Signed)
Call your cardiologist to let them know that you developed a rash with the rosuvastatin and to schedule an appointment with them.  They will get you switched to a different cholesterol medicine.  Continue taking your course of prednisone.  I would also suggest adding Zyrtec, Claritin, or Allegra.  If your rash does not improve in about a week, please let me know.

## 2022-11-13 NOTE — Telephone Encounter (Signed)
Pt called back, gave the message; see previous note. He verbalized understanding.

## 2022-11-13 NOTE — Progress Notes (Signed)
Acute Office Visit  Subjective:     Patient ID: Dennis Winters, male    DOB: [birth year], 61 y.o.   MRN: [id number]  Chief Complaint  Patient presents with   Apnea    HPI Patient is in today for sleep apnea.  His wife encouraged him to have a test done for sleep apnea.  He has never done a sleep study before, but he has poor sleep already and snores.  He also complains today of widespread pruritus.  He has developed side effects to atorvastatin and simvastatin in the past, his cardiologist started a trial of a low-dose of rosuvastatin.  This caused him to develop a rash, and he discontinued rosuvastatin about 2 weeks ago.  The rash has resolved, but pruritus persists.  Denies fever, chills, sore throat, headache.  He went to his work clinic earlier this week and was put on a course of prednisone for an unrelated issue, but he has only taken 1 dose and cannot say if that has been helpful or not yet.  ROS Negative unless otherwise noted in HPI    Objective:    BP 134/77 (BP Location: Right Arm, Patient Position: Sitting, Cuff Size: Large)   Pulse (!) 54   Resp 18   Ht 5\' 8"  (1.727 m)   Wt 227 lb (103 kg)   SpO2 98%   BMI 34.52 kg/m   Physical Exam Constitutional:      General: He is not in acute distress.    Appearance: Normal appearance.  HENT:     Head: Normocephalic and atraumatic.  Cardiovascular:     Rate and Rhythm: Normal rate and regular rhythm.     Heart sounds: Normal heart sounds. No murmur heard.    No friction rub. No gallop.  Pulmonary:     Effort: Pulmonary effort is normal. No respiratory distress.     Breath sounds: Normal breath sounds. No wheezing, rhonchi or rales.  Skin:    General: Skin is warm and dry.  Neurological:     Mental Status: He is alert and oriented to person, place, and time.  Psychiatric:        Mood and Affect: Mood normal.      Assessment & Plan:  Essential hypertension Assessment & Plan: BP goal <130/80.  Essentially at goal,  continue follow-up with cardiologist.  Recommended to call cardiology and schedule visit as soon as possible that has been sometime since his last appointment.  Orders: -     CBC with Differential/Platelet; Future -     Comprehensive metabolic panel; Future  Mixed hyperlipidemia Assessment & Plan: Discontinue rosuvastatin completely.  Recommend getting in touch with cardiology, per their last note they recommended discussing Repatha or Praluent.  Orders: -     Lipid panel; Future  BMI 31.0-31.9,adult -     CBC with Differential/Platelet; Future -     Comprehensive metabolic panel; Future -     Hemoglobin A1c; Future -     Lipid panel; Future -     VITAMIN D 25 Hydroxy (Vit-D Deficiency, Fractures); Future -     Home sleep test; Future  Vitamin D deficiency -     VITAMIN D 25 Hydroxy (Vit-D Deficiency, Fractures); Future  Need for Tdap vaccination -     Tdap vaccine greater than or equal to 7yo IM  Sleep difficulties -     Home sleep test; Future  Snoring -     Home sleep test; Future  Chronic pain of  left ankle Assessment & Plan: He broke his ankle several decades ago and has ongoing chronic left ankle pain.  It has worsened and is affecting his ability to walk.  He would like a referral to orthopedics, we will get x-rays for orthopedics to review as well.  Orders: -     DG Ankle Complete Left; Future -     Ambulatory referral to Orthopedic Surgery  Lab work has not been collected in over a year, repeating today.  Return if symptoms worsen or fail to improve.  Melida Quitter, PA

## 2022-11-13 NOTE — Assessment & Plan Note (Signed)
He broke his ankle several decades ago and has ongoing chronic left ankle pain.  It has worsened and is affecting his ability to walk.  He would like a referral to orthopedics, we will get x-rays for orthopedics to review as well.

## 2022-11-14 LAB — LIPID PANEL
Chol/HDL Ratio: 4.6 ratio (ref 0.0–5.0)
Cholesterol, Total: 219 mg/dL — ABNORMAL HIGH (ref 100–199)
HDL: 48 mg/dL (ref >39)
LDL Chol Calc (NIH): 133 mg/dL — ABNORMAL HIGH (ref 0–99)
Triglycerides: 215 mg/dL — ABNORMAL HIGH (ref 0–149)
VLDL Cholesterol Cal: 38 mg/dL (ref 5–40)

## 2022-11-14 LAB — COMPREHENSIVE METABOLIC PANEL
ALT: 20 IU/L (ref 0–44)
AST: 20 IU/L (ref 0–40)
Albumin/Globulin Ratio: 2 (ref 1.2–2.2)
Albumin: 4.2 g/dL (ref 3.8–4.9)
Alkaline Phosphatase: 79 IU/L (ref 44–121)
BUN/Creatinine Ratio: 18 (ref 10–24)
BUN: 12 mg/dL (ref 8–27)
Bilirubin Total: 0.3 mg/dL (ref 0.0–1.2)
CO2: 22 mmol/L (ref 20–29)
Calcium: 8.8 mg/dL (ref 8.6–10.2)
Chloride: 106 mmol/L (ref 96–106)
Creatinine, Ser: 0.68 mg/dL — ABNORMAL LOW (ref 0.76–1.27)
Globulin, Total: 2.1 g/dL (ref 1.5–4.5)
Glucose: 100 mg/dL — ABNORMAL HIGH (ref 70–99)
Potassium: 4.2 mmol/L (ref 3.5–5.2)
Sodium: 143 mmol/L (ref 134–144)
Total Protein: 6.3 g/dL (ref 6.0–8.5)
eGFR: 106 mL/min/{1.73_m2} (ref >59)

## 2022-11-14 LAB — CBC WITH DIFFERENTIAL/PLATELET
Basophils Absolute: 0.1 10*3/uL (ref 0.0–0.2)
Basos: 1 %
EOS (ABSOLUTE): 0.5 10*3/uL — ABNORMAL HIGH (ref 0.0–0.4)
Eos: 10 %
Hematocrit: 46.4 % (ref 37.5–51.0)
Hemoglobin: 14.9 g/dL (ref 13.0–17.7)
Immature Grans (Abs): 0 10*3/uL (ref 0.0–0.1)
Immature Granulocytes: 0 %
Lymphocytes Absolute: 1.6 10*3/uL (ref 0.7–3.1)
Lymphs: 31 %
MCH: 29.3 pg (ref 26.6–33.0)
MCHC: 32.1 g/dL (ref 31.5–35.7)
MCV: 91 fL (ref 79–97)
Monocytes Absolute: 0.5 10*3/uL (ref 0.1–0.9)
Monocytes: 9 %
Neutrophils Absolute: 2.6 10*3/uL (ref 1.4–7.0)
Neutrophils: 49 %
Platelets: 271 10*3/uL (ref 150–450)
RBC: 5.09 x10E6/uL (ref 4.14–5.80)
RDW: 13.5 % (ref 11.6–15.4)
WBC: 5.2 10*3/uL (ref 3.4–10.8)

## 2022-11-14 LAB — HEMOGLOBIN A1C
Est. average glucose Bld gHb Est-mCnc: 120 mg/dL
Hgb A1c MFr Bld: 5.8 % — ABNORMAL HIGH (ref 4.8–5.6)

## 2022-11-14 LAB — VITAMIN D 25 HYDROXY (VIT D DEFICIENCY, FRACTURES): Vit D, 25-Hydroxy: 20.5 ng/mL — ABNORMAL LOW (ref 30.0–100.0)

## 2022-11-17 ENCOUNTER — Other Ambulatory Visit: Payer: Self-pay | Admitting: Family Medicine

## 2022-11-17 DIAGNOSIS — E559 Vitamin D deficiency, unspecified: Secondary | ICD-10-CM

## 2022-11-17 MED ORDER — VITAMIN D (ERGOCALCIFEROL) 1.25 MG (50000 UNIT) PO CAPS
50000.0000 [IU] | ORAL_CAPSULE | ORAL | 0 refills | Status: AC
Start: 2022-11-17 — End: ?

## 2022-11-18 NOTE — Progress Notes (Unsigned)
Cardiology Clinic Note   Date: 11/19/2022 ID: Dennis Winters, DOB 24-Nov-1960, MRN [id number]  Primary Cardiologist:  Thurmon Fair, MD  Patient Profile    Dennis Winters is a 62 y.o. male who presents to the clinic today for routine follow up.   Past medical history significant for: CAD. LHC 05/28/2016 (NSTEMI): Severe triple CAD.  Moderate proximal RCA stenosis.  Mid RCA 99%.  Occluded OM that fills late from left to left collaterals.  Proximal to mid LAD diffuse mild to moderate stenosis.  Mid LAD just beyond D2 80%.  CTS consult. 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  RIMA to RCA.  Left radial to OM. Nuclear stress test 11/12/2017: Mild inferior wall ischemia at the base.  Patient also had 1 mm upsloping ST segment depression with exercise.  EF normal 60%.  Findings consistent with ischemia.  Low risk study. Echo 11/22/2017: EF 65 to 70%.  Mildly dilated ascending aorta. Hypertension. Hyperlipidemia. Lipid panel 11/13/2022: LDL 133, HDL 48, TG 215, total 219. Alcohol use. Tobacco abuse. Complete cessation since 2017.   History of Present Illness    Dennis Winters was first evaluated by Dr. Royann Shivers on 05/27/2016 for NSTEMI during hospitalization.  Patient presented to the ED on 05/26/2016 with a 2-day history of chest pain with associated left arm numbness.  Troponin elevated x 3.  LHC showed severe triple-vessel CAD.  He underwent CABG x 3.  He continues to be followed by Dr. Royann Shivers.  Patient was last seen in the office by Dr. Royann Shivers on 05/30/2021 for follow-up.  He was stable from a heart attack standpoint.  He reported intolerance to high doses of rosuvastatin as well as simvastatin and atorvastatin.  He was started on low-dose rosuvastatin with co-Q10.  Today, patient is doing well. He reports mild sternal pain since his CABG. Pain is in the center of his chest and worsened with lifting his arms. It is reproducible with palpation. Otherwise he denies shortness of breath or dyspnea  on exertion. No chest pain, pressure, or tightness. Denies lower extremity edema, orthopnea, or PND. No palpitations.  He does not follow a formal exercise routine. 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 He also does wood crafts, gardening and yard work at home. He has not been able to take Crestor. He stopped it two months ago secondary to breaking out in a rash. This has happened with other statins as well. He was recently told he has an allergy to red and yellow dye and feels this may be why he cannot take the medicine. He is willing to retry the medication if he can get it in a white pill.      ROS: All other systems reviewed and are otherwise negative except as noted in History of Present Illness.  Studies Reviewed    ECG personally reviewed by me today: Sinus bradycardia, 55 bpm.   No significant changes from 05/30/2021.          Physical Exam    VS:  BP 124/74   Pulse (!) 55   Ht 5\' 8"  (1.727 m)   Wt 222 lb 12.8 oz (101.1 kg)   SpO2 96%   BMI 33.88 kg/m  , BMI Body mass index is 33.88 kg/m.  GEN: Well nourished, well developed, in no acute distress. Neck: No JVD or carotid bruits. Cardiac:  RRR. No murmurs. No rubs or gallops.   Respiratory:  Respirations regular and unlabored. Clear to auscultation without rales, wheezing or rhonchi. GI: Soft, nontender, nondistended. Extremities: Radials/DP/PT 2+ and equal bilaterally. No clubbing or cyanosis. No edema.  Skin: Warm and dry, no rash. Neuro: Strength intact.  Assessment & Plan    CAD.  S/p CABG x  24 May 2016.  Patient denies anginal pain. He does have some sternal pain worsened with raising his arms over his head and reproduced with palpation. 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 Will restart Crestor (see #3). Hypertension. BP today 124/74. Patient denies headaches,  dizziness or vision changes. Continue lifestyle management.  Hyperlipidemia.  LDL May 2024 133, not at goal. Patient has not taken Crestor for 2 months secondary to breaking out in a rash. He recently found out he is allergic to red/yellow dye. Will restart medication and request he get a white pill. He will contact the office if he cannot get a white pill. If he is able to get the medication he will contact me through My Chart in 1 month to let me know if he is tolerating his medication. Will repeat lipid panel and LFTs in 10-12 weeks.   Disposition: Crestor 10 mg daily (requested white pills from pharmacy secondary to red/yellow dye allergy). Repeat lipid panel and LFTs in 10-12 weeks. Return in 1 year or sooner as needed.          Signed, Etta Grandchild. Rula Keniston, DNP, NP-C

## 2022-11-19 ENCOUNTER — Encounter: Payer: Self-pay | Admitting: Student

## 2022-11-19 ENCOUNTER — Other Ambulatory Visit: Payer: Self-pay | Admitting: *Deleted

## 2022-11-19 ENCOUNTER — Ambulatory Visit: Payer: BC Managed Care – PPO | Attending: Student | Admitting: Student

## 2022-11-19 VITALS — BP 124/74 | HR 55 | Ht 68.0 in | Wt 222.8 lb

## 2022-11-19 DIAGNOSIS — I10 Essential (primary) hypertension: Secondary | ICD-10-CM | POA: Diagnosis not present

## 2022-11-19 DIAGNOSIS — E785 Hyperlipidemia, unspecified: Secondary | ICD-10-CM | POA: Diagnosis not present

## 2022-11-19 DIAGNOSIS — I2510 Atherosclerotic heart disease of native coronary artery without angina pectoris: Secondary | ICD-10-CM | POA: Diagnosis not present

## 2022-11-19 MED ORDER — ROSUVASTATIN CALCIUM 10 MG PO TABS: 10.0000 mg | ORAL_TABLET | Freq: Every day | ORAL | 11 refills | Status: AC

## 2022-11-19 NOTE — Patient Instructions (Addendum)
Medication Instructions:  Start Rosuvastatin ( Crestor) 10 mg One tablet daily  preferably at bedtime   *If you need a refill on your cardiac medications before your next appointment, please call your pharmacy*   Lab Work:fasting ( nothing to eat ord drink except water the morning of the the test)  in 2 months @ 7/ 30/24  Lipid Hepatic panel ( liver)  If you have labs (blood work) drawn today and your tests are completely normal, you will receive your results only by: MyChart Message (if you have MyChart) OR A paper copy in the mail If you have any lab test that is abnormal or we need to change your treatment, we will call you to review the results.   Testing/Procedures: Not needed   Follow-Up: At [facility], you and your health needs are our priority.  As part of our continuing mission to provide you with exceptional heart care, we have created designated Provider Care Teams.  These Care Teams include your primary Cardiologist (physician) and Advanced Practice Providers (APPs -  Physician Assistants and Nurse Practitioners) who all work together to provide you with the care you need, when you need it.     Your next appointment:   12 month(s)  The format for your next appointment:   In Person  Provider:   Thurmon Fair, MD

## 2022-12-09 ENCOUNTER — Ambulatory Visit
Admission: EM | Admit: 2022-12-09 | Discharge: 2022-12-09 | Disposition: A | Discharge status: Home / Self Care | Payer: BC Managed Care – PPO | Attending: Internal Medicine | Admitting: Internal Medicine

## 2022-12-09 ENCOUNTER — Ambulatory Visit (INDEPENDENT_AMBULATORY_CARE_PROVIDER_SITE_OTHER): Payer: BC Managed Care – PPO

## 2022-12-09 DIAGNOSIS — L03116 Cellulitis of left lower limb: Secondary | ICD-10-CM | POA: Diagnosis not present

## 2022-12-09 DIAGNOSIS — S8992XA Unspecified injury of left lower leg, initial encounter: Secondary | ICD-10-CM

## 2022-12-09 MED ORDER — DOXYCYCLINE HYCLATE 100 MG PO CAPS: 100.0000 mg | ORAL_CAPSULE | Freq: Two times a day (BID) | ORAL | 0 refills | Status: AC

## 2022-12-09 NOTE — Discharge Instructions (Signed)
I have prescribed an antibiotic given concern of infection of the skin of your knee.  X-ray is still pending.  I will call with results.  Follow-up if any symptoms persist or worsen.

## 2022-12-09 NOTE — ED Triage Notes (Signed)
Pt  c/o needing a wound check, after hitting his left knee with a wire brush last week and he thinksi if may be infected he says he feels like there may be apiece of the brush still stuk in the knee,   There is redness, warmth to touch and swelling around the wound.

## 2022-12-09 NOTE — ED Provider Notes (Signed)
[name]    CSN: [id number] Arrival date & time: 12/09/22  0808      History   Chief Complaint No chief complaint on file.   HPI Dennis Winters is a 62 y.o. male.   Patient presents with concern of infection to the wound to his left knee.  Patient reports that approximately 1 week ago he was using a wire brush when it slipped and hit his knee causing a puncture wound.  He states over the past few days he has noticed increased swelling and redness.  He denies any fever.  He is concerned that there could be a piece of the wire brush stuck in his knee.  Last tetanus vaccine was approximately 2 weeks ago.     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   NSTEMI (non-ST elevated myocardial infarction) [facility])    Mixed hyperlipidemia    Essential hypertension    Calculus of gallbladder with acute cholecystitis, without mention of obstruction 04/19/2013    Past Surgical History:  Procedure Laterality Date   ANKLE SURGERY     CARDIAC CATHETERIZATION N/A 05/28/2016   Procedure: Left Heart Cath and Coronary Angiography;  Surgeon: Kathleene Hazel, MD;  Location: [facility] INVASIVE CV LAB;  Service: Cardiovascular;  Laterality: N/A;   CHOLECYSTECTOMY N/A 04/13/2013   Procedure: LAPAROSCOPIC CHOLECYSTECTOMY , attempted IOC.;  Surgeon: Ernestene Mention, MD;  Location: [facility] ORS;  Service: General;  Laterality: N/A;   CORONARY ARTERY BYPASS GRAFT     CORONARY ARTERY BYPASS GRAFT N/A 05/29/2016   Procedure: CORONARY ARTERY BYPASS GRAFTING (CABG) x 3 with bilateral internal mammary arteries and left radial artery grafting  LIMA-LAD FREE RIMA-RCA LEFT RADIAL-OM;  Surgeon: Alleen Borne, MD;  Location: [facility] OR;  Service: Open Heart Surgery;  Laterality: N/A;   RADIAL ARTERY HARVEST Left 05/29/2016   Procedure: RADIAL ARTERY HARVEST;  Surgeon: Alleen Borne, MD;  Location: [facility] OR;  Service: Open Heart Surgery;  Laterality: Left;   TEE WITHOUT CARDIOVERSION N/A 05/29/2016   Procedure: TRANSESOPHAGEAL ECHOCARDIOGRAM (TEE);  Surgeon: Alleen Borne, MD;  Location: [facility] OR;  Service: Open Heart Surgery;  Laterality: N/A;       Home Medications    Prior to Admission medications   Medication Sig Start Date End Date Taking? Authorizing Provider  doxycycline (VIBRAMYCIN) 100 MG capsule Take 1 capsule (100 mg total) by mouth 2 (two) times daily. 12/09/22  Yes Garreth Burnsworth, Rolly Salter E, FNP  rosuvastatin (CRESTOR) 10 MG tablet Take 1 tablet (10 mg total) by mouth at bedtime. 11/19/22 02/17/23  Carlos Levering, NP  Vitamin D, Ergocalciferol, (DRISDOL) 1.25 MG (50000 UNIT) CAPS capsule Take 1 capsule (50,000 Units total) by mouth every 7 (seven) days. 11/17/22   Melida Quitter, PA    Family History Family History  Problem Relation Age of Onset   High Cholesterol Father    High blood pressure Father    Pulmonary disease Maternal Aunt    CAD Neg Hx     Social History Social History   Tobacco Use   Smoking status: Former  Packs/day: .5    Types: Cigarettes   Smokeless tobacco: Never  Vaping Use   Vaping Use: Never used  Substance Use Topics   Alcohol use: Yes    Alcohol/week: 6.0 standard drinks of alcohol    Types: 6 Cans of beer per week   Drug use: No     Allergies   Lipitor [atorvastatin], Penicillins, and Zocor [simvastatin]   Review of Systems Review of Systems Per HPI  Physical Exam Triage Vital Signs ED Triage Vitals  Enc Vitals Group     BP 12/09/22 0821 126/80     Pulse Rate 12/09/22 0821 (!) 51     Resp --      Temp 12/09/22 0821 98 F (36.7 C)     Temp Source 12/09/22 0821 Oral     SpO2  12/09/22 0821 94 %     Weight --      Height --      Head Circumference --      Peak Flow --      Pain Score 12/09/22 0825 6     Pain Loc --      Pain Edu? --      Excl. in GC? --    No data found.  Updated Vital Signs BP 126/80 (BP Location: Right Arm)   Pulse (!) 51   Temp 98 F (36.7 C) (Oral)   SpO2 96%   Visual Acuity Right Eye Distance:   Left Eye Distance:   Bilateral Distance:    Right Eye Near:   Left Eye Near:    Bilateral Near:     Physical Exam Constitutional:      General: He is not in acute distress.    Appearance: Normal appearance. He is not toxic-appearing or diaphoretic.  HENT:     Head: Normocephalic and atraumatic.  Eyes:     Extraocular Movements: Extraocular movements intact.     Conjunctiva/sclera: Conjunctivae normal.  Pulmonary:     Effort: Pulmonary effort is normal.  Skin:    Comments: Patient has approximately 1 inch in diameter area of mild swelling and erythema present to left anterior upper knee.  No purulent drainage noted.  No bleeding noted.  No warmth noted.  Full range of motion of knee present.  Neurovascularly intact.  Neurological:     General: No focal deficit present.     Mental Status: He is alert and oriented to person, place, and time. Mental status is at baseline.  Psychiatric:        Mood and Affect: Mood normal.        Behavior: Behavior normal.        Thought Content: Thought content normal.        Judgment: Judgment normal.      UC Treatments / Results  Labs (all labs ordered are listed, but only abnormal results are displayed) Labs Reviewed - No data to display  EKG   Radiology DG Knee Complete 4 Views Left  Result Date: 12/09/2022 CLINICAL DATA:  Hit left knee with a wire brush 1 week ago, possibly infected. Concern for retained foreign body. EXAM: LEFT KNEE - COMPLETE 4+ VIEW COMPARISON:  None Available. FINDINGS: There is no acute fracture or dislocation. Bony alignment is normal. The joint spaces are  preserved. The soft tissues are unremarkable. There is no soft tissue gas or radiopaque foreign body. IMPRESSION: No radiopaque foreign body identified. No acute osseous abnormality. Electronically Signed   By: Lesia Hausen M.D.   On:  12/09/2022 09:21    Procedures Procedures (including critical care time)  Medications Ordered in UC Medications - No data to display  Initial Impression / Assessment and Plan / UC Course  I have reviewed the triage vital signs and the nursing notes.  Pertinent labs & imaging results that were available during my care of the patient were reviewed by me and considered in my medical decision making (see chart for details).     I am concerned about cellulitis to the left knee so will treat with doxycycline given patient is allergic to penicillins.  X-ray of knee was completed to assess for remaining foreign body.  It was negative for any acute process or foreign body noted.  Advised patient to monitor closely for any worsening symptoms and to follow-up sooner if they occur.  Tetanus vaccine is already up-to-date per patient report.  Advised strict follow-up precautions.  Patient verbalized understanding and was agreeable with plan.  Patient left prior to x-ray resulting.  Called patient and notified him of results.  All questions answered. Final Clinical Impressions(s) / UC Diagnoses   Final diagnoses:  Cellulitis of left knee  Injury of left knee, initial encounter     Discharge Instructions      I have prescribed an antibiotic given concern of infection of the skin of your knee.  X-ray is still pending.  I will call with results.  Follow-up if any symptoms persist or worsen.     ED Prescriptions     Medication Sig Dispense Auth. Provider   doxycycline (VIBRAMYCIN) 100 MG capsule Take 1 capsule (100 mg total) by mouth 2 (two) times daily. 20 capsule Gustavus Bryant, [place]      PDMP not reviewed this encounter.   Gustavus Bryant, [place] 12/09/22  [phone]

## 2023-04-20 ENCOUNTER — Encounter: Payer: BC Managed Care – PPO | Admitting: Nurse Practitioner

## 2023-04-20 ENCOUNTER — Encounter: Payer: BC Managed Care – PPO | Admitting: Family Medicine

## 2023-10-07 ENCOUNTER — Encounter: Payer: Self-pay | Admitting: Family Medicine

## 2023-10-07 NOTE — Progress Notes (Deleted)
   Annual physical  Subjective   Patient ID: Savaughn Karwowski, male    DOB: 27-Feb-1961  Age: 63 y.o. MRN: [id number]  No chief complaint on file.  HPI Anuel is a 63 y.o. old male here  for annual exam.   Mild ldl 133  A1c 5.8  Work:*** Relationship:*** Children:*** Tobacco:*** Alcohol:*** Recreational drugs:***  Diet:*** Exercise:***  Family history of prostate or colorectal cancer:***  Advance directive:***  Other providers:***  HPI  Separate, acute concerns today: ***  The ASCVD Risk score (Arnett DK, et al., 2019) failed to calculate for the following reasons:   Risk score cannot be calculated because patient has a medical history suggesting prior/existing ASCVD  Health Maintenance Due  Topic Date Due   Hepatitis C Screening  Never done   Pneumococcal Vaccine 1-16 Years old (1 of 2 - PCV) Never done   Fecal DNA (Cologuard)  Never done   Zoster Vaccines- Shingrix (1 of 2) Never done   COVID-19 Vaccine (1 - 2024-25 season) Never done      Objective:     There were no vitals taken for this visit. {Vitals History (Optional):23777}  Physical Exam   No results found for any visits on 10/07/23.      Assessment & Plan:   There are no diagnoses linked to this encounter.   No follow-ups on file.    Laneta Pintos, MD
